# Patient Record
Sex: Male | Born: 1955 | Race: White | Hispanic: No | Marital: Married | Smoking: Never smoker
Health system: Southern US, Community
[De-identification: ages and names within clinical notes are randomized; demographics above are authoritative.]

## PROBLEM LIST (undated history)

## (undated) DIAGNOSIS — Z860100 Personal history of colon polyps, unspecified: Secondary | ICD-10-CM

## (undated) DIAGNOSIS — Z809 Family history of malignant neoplasm, unspecified: Secondary | ICD-10-CM

## (undated) DIAGNOSIS — E119 Type 2 diabetes mellitus without complications: Secondary | ICD-10-CM

## (undated) DIAGNOSIS — I10 Essential (primary) hypertension: Secondary | ICD-10-CM

## (undated) HISTORY — DX: Personal history of colon polyps, unspecified: Z86.0100

## (undated) HISTORY — DX: Type 2 diabetes mellitus without complications: E11.9

## (undated) HISTORY — DX: Essential (primary) hypertension: I10

## (undated) HISTORY — DX: Personal history of colonic polyps: Z86.010

## (undated) HISTORY — DX: Family history of malignant neoplasm, unspecified: Z80.9

## (undated) HISTORY — PX: CARDIAC CATHETERIZATION: SHX172

---

## 2009-11-02 ENCOUNTER — Emergency Department: Payer: Self-pay | Admitting: Internal Medicine

## 2010-11-07 IMAGING — CR DG ABDOMEN 1V
1 series · 2 of 2 positions shown · non-contrast
Comparison: none

REASON FOR EXAM: back pain
COMMENTS:

[Series 1: view not recorded · 0.17mm/px · 2 of 2 slices shown]
[im 1/2]
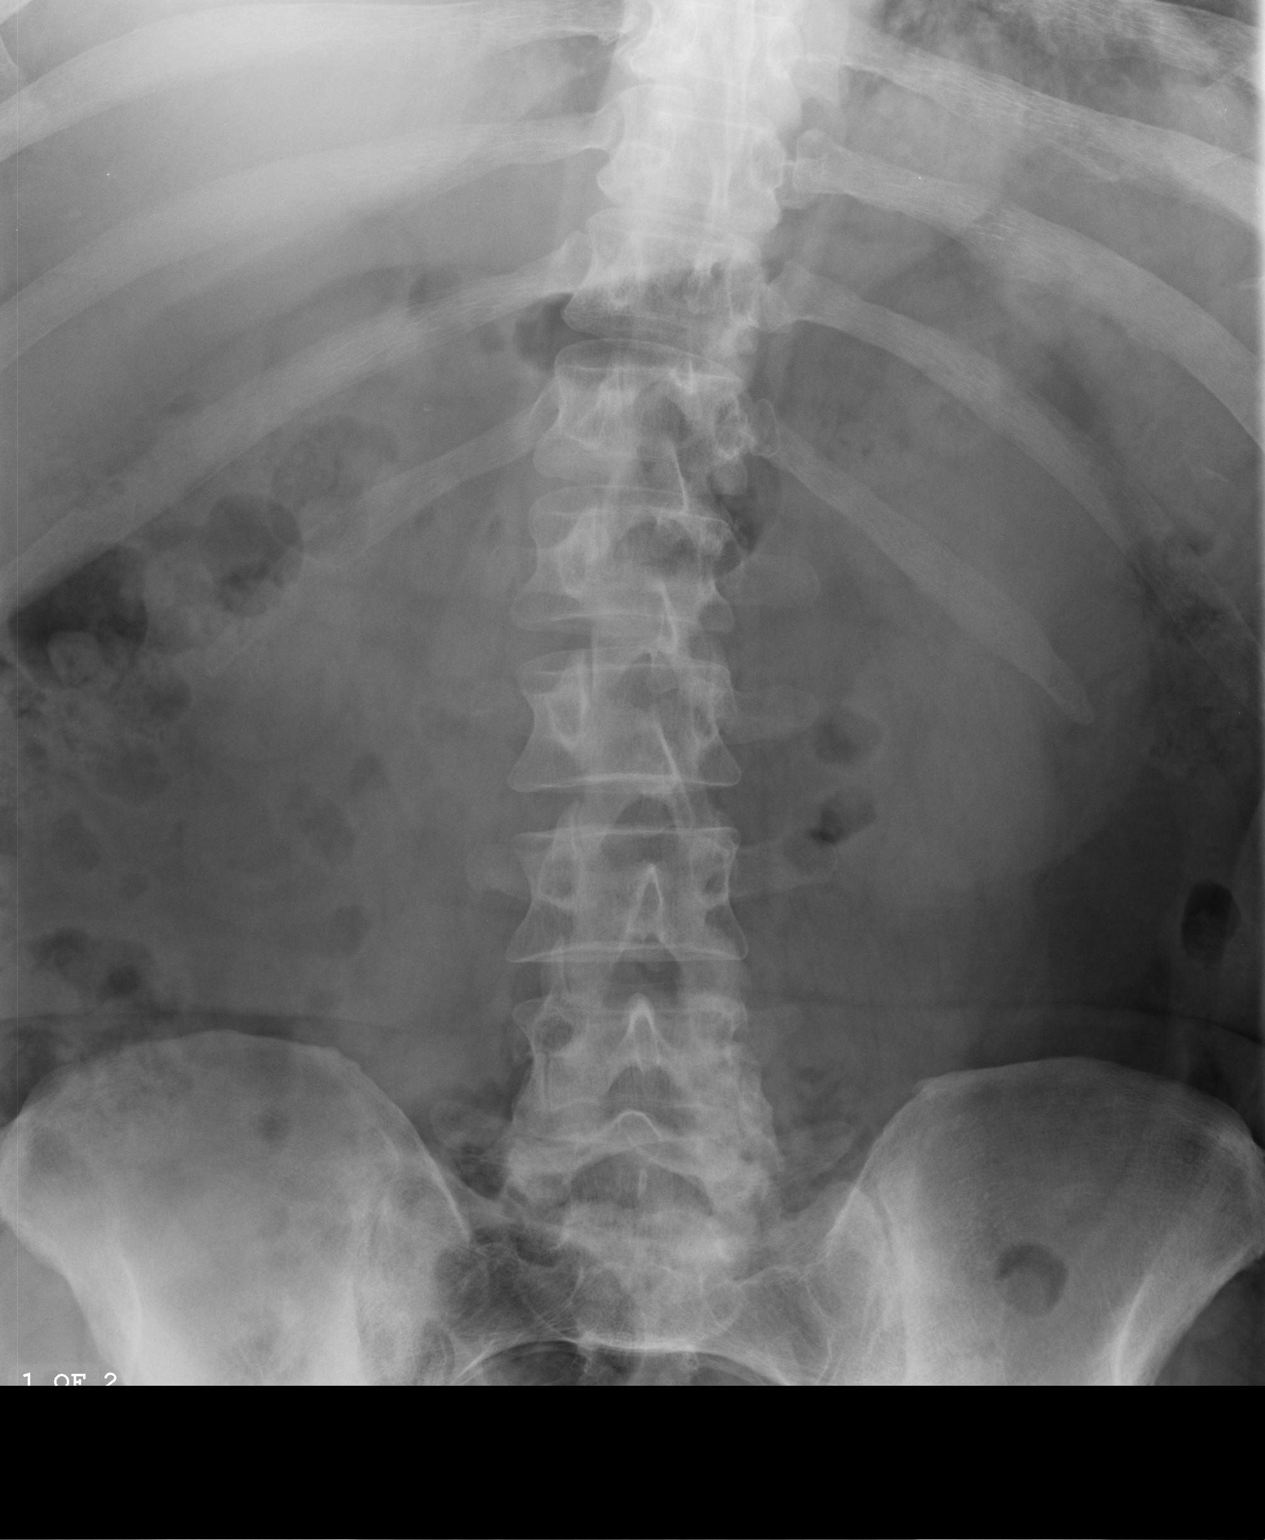
[im 2/2]
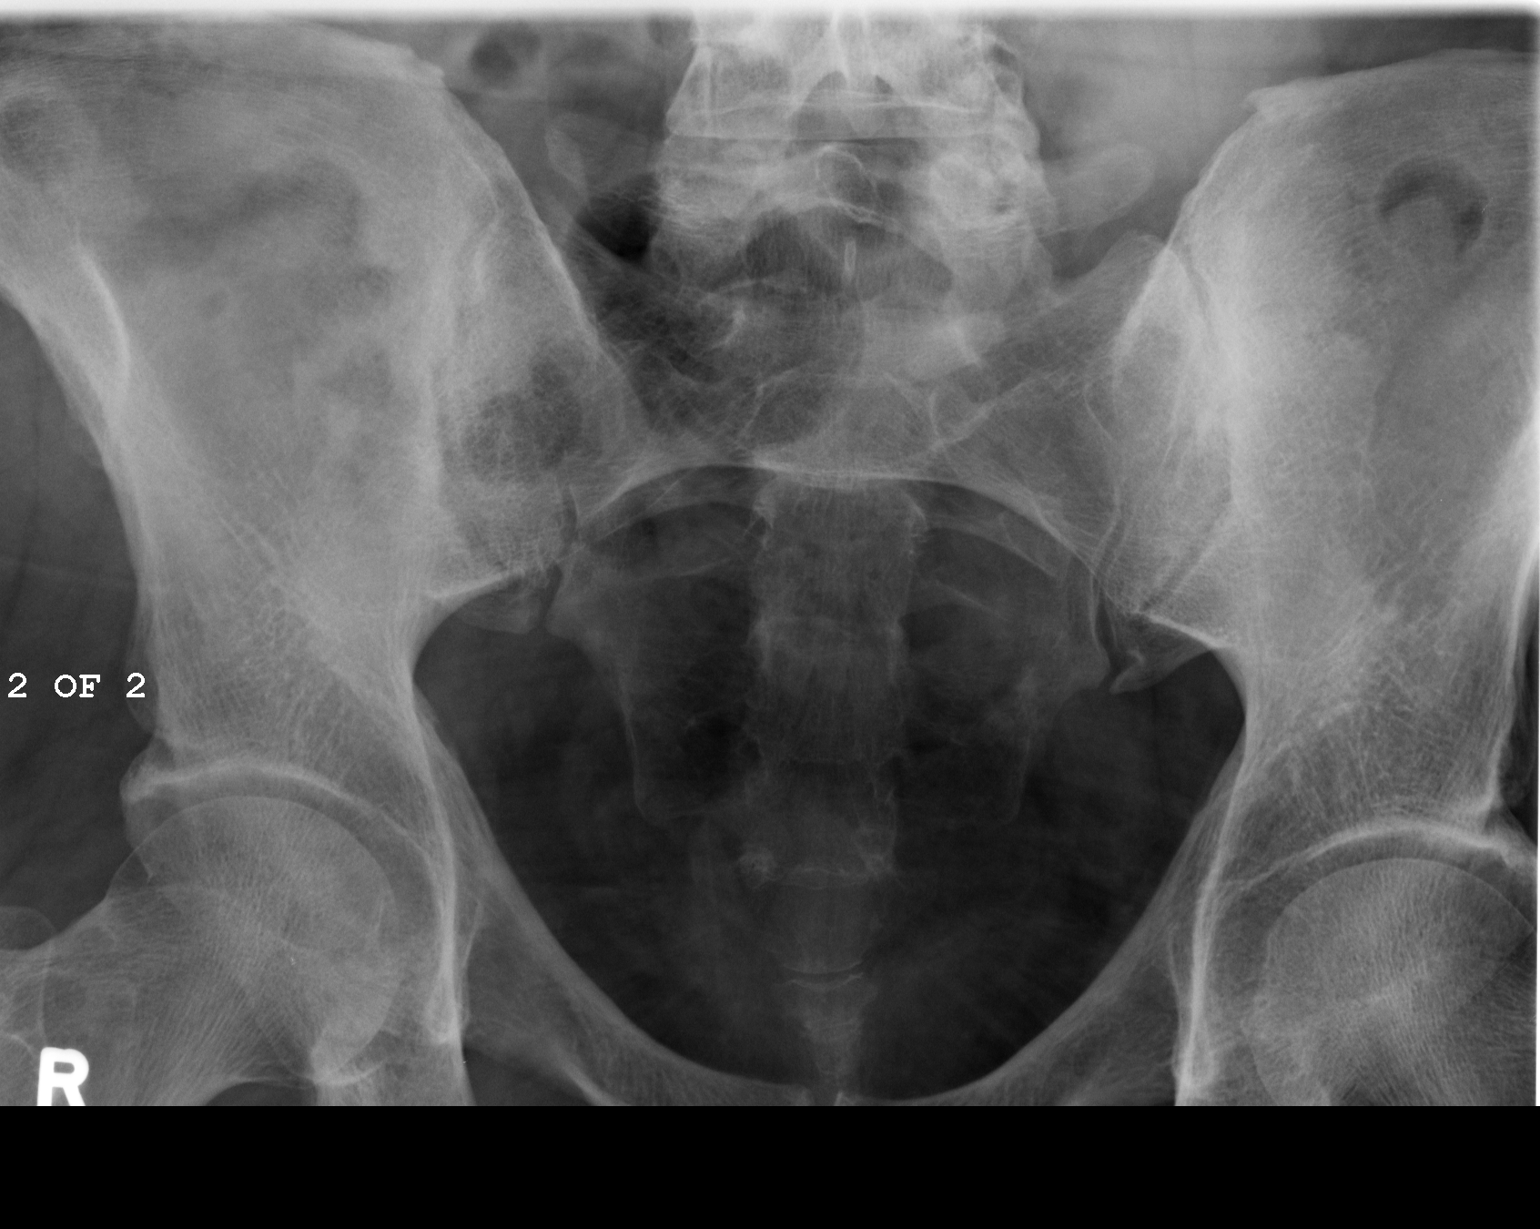

[2 of 2 positions shown; findings below may reference images not displayed]

PROCEDURE:     DXR - DXR KIDNEY URETER BLADDER  - November 02, 2009  [DATE]

RESULT:     The lumbar vertebral bodies are preserved in height. The bony
pelvis appears intact where visualized. I do not see abnormal calcifications
projecting over either kidney. The bowel gas pattern is within the limits of
normal.
IMPRESSION: I do not see objective evidence of calcified urinary tract
stones. Followup imaging is available upon request.

## 2014-07-05 LAB — HEPATIC FUNCTION PANEL
ALT: 55 U/L — AB (ref 10–40)
AST: 41 U/L — AB (ref 14–40)
Alkaline Phosphatase: 97 U/L (ref 25–125)

## 2014-07-05 LAB — LIPID PANEL
Cholesterol: 130 mg/dL (ref 0–200)
HDL: 24 mg/dL — AB (ref 35–70)
LDL Cholesterol: 71 mg/dL
LDl/HDL Ratio: 3
Triglycerides: 175 mg/dL — AB (ref 40–160)

## 2014-07-05 LAB — CBC AND DIFFERENTIAL
HCT: 42 % (ref 41–53)
Hemoglobin: 14.4 g/dL (ref 13.5–17.5)
Neutrophils Absolute: 41 /uL
Platelets: 252 10*3/uL (ref 150–399)
WBC: 6.1 10^3/mL

## 2014-07-05 LAB — TSH: TSH: 2.94 u[IU]/mL (ref <=5.90)

## 2014-07-05 LAB — BASIC METABOLIC PANEL
BUN: 11 mg/dL (ref 4–21)
Creatinine: 0.9 mg/dL (ref <=1.3)
Glucose: 268 mg/dL
Potassium: 5.1 mmol/L (ref 3.4–5.3)
Sodium: 134 mmol/L — AB (ref 137–147)

## 2014-07-05 LAB — HEMOGLOBIN A1C: Hgb A1c MFr Bld: 9.7 % — AB (ref 4.0–6.0)

## 2015-02-12 DIAGNOSIS — A6000 Herpesviral infection of urogenital system, unspecified: Secondary | ICD-10-CM | POA: Insufficient documentation

## 2015-02-12 DIAGNOSIS — I10 Essential (primary) hypertension: Secondary | ICD-10-CM | POA: Insufficient documentation

## 2015-02-12 DIAGNOSIS — E669 Obesity, unspecified: Secondary | ICD-10-CM

## 2015-02-12 DIAGNOSIS — E663 Overweight: Secondary | ICD-10-CM | POA: Insufficient documentation

## 2015-02-12 DIAGNOSIS — E6609 Other obesity due to excess calories: Secondary | ICD-10-CM | POA: Insufficient documentation

## 2015-02-12 DIAGNOSIS — E1169 Type 2 diabetes mellitus with other specified complication: Secondary | ICD-10-CM | POA: Insufficient documentation

## 2015-02-21 ENCOUNTER — Encounter: Payer: Self-pay | Admitting: Family Medicine

## 2015-02-21 ENCOUNTER — Ambulatory Visit (INDEPENDENT_AMBULATORY_CARE_PROVIDER_SITE_OTHER): Payer: BLUE CROSS/BLUE SHIELD | Admitting: Family Medicine

## 2015-02-21 VITALS — BP 142/86 | HR 64 | Temp 97.6°F | Resp 16 | Wt 238.0 lb

## 2015-02-21 DIAGNOSIS — E119 Type 2 diabetes mellitus without complications: Secondary | ICD-10-CM

## 2015-02-21 DIAGNOSIS — I10 Essential (primary) hypertension: Secondary | ICD-10-CM | POA: Diagnosis not present

## 2015-02-21 NOTE — Progress Notes (Signed)
Patient ID: Reginald Murphy, male   DOB: 1955-09-19, 59 y.o.   MRN: [id number]   Reginald Murphy  MRN: [id number] DOB: 02-21-1956  Subjective:  HPI  1. Type 2 diabetes mellitus without complication  Patient presents for follow up of his diabetes.  His last A1C was on 07/05/14 and was 9.7.  He is checking his glucose daily and reports that it is ranging from 109-162.  He reports that he has higher readings in the morning than he does in the evening.  It is of note that the patient works 2nd shift and often eats something late in the evening.  Patient states that he feels well and has no complaints.  2. Essential hypertension   Patient also presents for follow up of his hypertension.  He was last seen on 10/24/14 and at that time his BP reading was 138/82.  He reports he does not have a BP machine at home and does not check his pressure outside of our office.  He states his BP is elevated today due to increased stress as he is going through a divorce currently.  Patient Active Problem List   Diagnosis Date Noted  . Diabetes mellitus type 2 in obese 02/12/2015  . Genital herpes 02/12/2015  . Essential (primary) hypertension 02/12/2015  . Excess weight 02/12/2015    Past Medical History  Diagnosis Date  . Diabetes mellitus without complication   . Hypertension     History   Social History  . Marital Status: Married    Spouse Name: N/A  . Number of Children: N/A  . Years of Education: N/A   Occupational History  . Not on file.   Social History Main Topics  . Smoking status: Never Smoker   . Smokeless tobacco: Not on file  . Alcohol Use: 0.0 oz/week    0 Standard drinks or equivalent per week     Comment: 1/monthly  . Drug Use: No  . Sexual Activity: Not on file   Other Topics Concern  . Not on file   Social History Narrative    Outpatient Prescriptions Prior to Visit  Medication Sig Dispense Refill  . glucose blood test strip 1 each by Other route as needed for other.  Use as instructed    . lisinopril (PRINIVIL,ZESTRIL) 20 MG tablet Take by mouth.    . metFORMIN (GLUCOPHAGE) 1000 MG tablet Take by mouth.     No facility-administered medications prior to visit.    No Known Allergies  Review of Systems  Constitutional: Negative.   HENT: Negative.   Eyes: Negative.   Respiratory: Negative.   Cardiovascular: Negative.   Gastrointestinal: Negative.   Genitourinary: Negative.   Musculoskeletal: Negative.   Neurological: Negative.   Endo/Heme/Allergies: Negative.   Psychiatric/Behavioral: Negative.    Objective:  BP 142/86 mmHg  Pulse 64  Temp(Src) 97.6 F (36.4 C) (Oral)  Resp 16  Wt 238 lb (107.956 kg)  Physical Exam  Constitutional: He is oriented to person, place, and time and well-developed, well-nourished, and in no distress.  HENT:  Head: Normocephalic and atraumatic.  Right Ear: External ear normal.  Left Ear: External ear normal.  Nose: Nose normal.  Mouth/Throat: Oropharynx is clear and moist.  Eyes: Conjunctivae and EOM are normal. Pupils are equal, round, and reactive to light.  Neck: Normal range of motion. Neck supple.  Cardiovascular: Normal rate, regular rhythm, normal heart sounds and intact distal pulses.   Pulmonary/Chest: Effort normal and breath sounds normal.  Abdominal: Soft.  Bowel sounds are normal.  Neurological: He is alert and oriented to person, place, and time. Gait normal.  Skin: Skin is warm and dry.  Psychiatric: Mood, memory, affect and judgment normal.   Monofilament diabetic foot exam is normal  Assessment and Plan :  Type 2 diabetes mellitus without complication  Essential hypertension   Obesity Patient says he has very erratic work hours and therefore erratic eating and exercise habits. I stressed to him that the importance of controlling his disease state is the control of his habits regarding his weight. RTC 3-4 months.  Miguel Aschoff MD [facility] 02/21/2015 11:06 AM

## 2015-02-26 NOTE — Progress Notes (Signed)
No answer, voice mail not set up.  ED

## 2015-02-27 ENCOUNTER — Telehealth: Payer: Self-pay

## 2015-02-27 LAB — LIPID PANEL WITH LDL/HDL RATIO
Cholesterol, Total: 152 mg/dL (ref 100–199)
HDL: 27 mg/dL — ABNORMAL LOW (ref >39)
LDL Calculated: 77 mg/dL (ref 0–99)
LDl/HDL Ratio: 2.9 ratio units (ref 0.0–3.6)
Triglycerides: 240 mg/dL — ABNORMAL HIGH (ref 0–149)
VLDL Cholesterol Cal: 48 mg/dL — ABNORMAL HIGH (ref 5–40)

## 2015-02-27 LAB — CMP14+EGFR
ALT: 19 IU/L (ref 0–44)
AST: 16 IU/L (ref 0–40)
Albumin/Globulin Ratio: 1.9 (ref 1.1–2.5)
Albumin: 4.5 g/dL (ref 3.5–5.5)
Alkaline Phosphatase: 68 IU/L (ref 39–117)
BUN/Creatinine Ratio: 16 (ref 9–20)
BUN: 15 mg/dL (ref 6–24)
Bilirubin Total: 0.5 mg/dL (ref 0.0–1.2)
CO2: 21 mmol/L (ref 18–29)
Calcium: 9.5 mg/dL (ref 8.7–10.2)
Chloride: 103 mmol/L (ref 97–108)
Creatinine, Ser: 0.91 mg/dL (ref 0.76–1.27)
GFR calc Af Amer: 109 mL/min/{1.73_m2} (ref >59)
GFR calc non Af Amer: 94 mL/min/{1.73_m2} (ref >59)
Globulin, Total: 2.4 g/dL (ref 1.5–4.5)
Glucose: 112 mg/dL — ABNORMAL HIGH (ref 65–99)
Potassium: 4.5 mmol/L (ref 3.5–5.2)
Sodium: 140 mmol/L (ref 134–144)
Total Protein: 6.9 g/dL (ref 6.0–8.5)

## 2015-02-27 LAB — TSH: TSH: 2.8 u[IU]/mL (ref 0.450–4.500)

## 2015-02-27 LAB — HEMOGLOBIN A1C
Est. average glucose Bld gHb Est-mCnc: 146 mg/dL
Hgb A1c MFr Bld: 6.7 % — ABNORMAL HIGH (ref 4.8–5.6)

## 2015-02-27 NOTE — Telephone Encounter (Signed)
Patient has not yet been notified  ED

## 2015-02-27 NOTE — Telephone Encounter (Signed)
Check if patient has been advised

## 2015-03-01 LAB — CBC WITH DIFFERENTIAL/PLATELET

## 2015-03-01 LAB — SPECIMEN STATUS REPORT

## 2015-07-26 ENCOUNTER — Ambulatory Visit: Payer: BLUE CROSS/BLUE SHIELD | Admitting: Family Medicine

## 2015-07-31 ENCOUNTER — Ambulatory Visit: Payer: BLUE CROSS/BLUE SHIELD | Admitting: Family Medicine

## 2015-08-11 ENCOUNTER — Other Ambulatory Visit: Payer: Self-pay | Admitting: Family Medicine

## 2015-08-15 ENCOUNTER — Encounter: Payer: Self-pay | Admitting: Family Medicine

## 2015-08-15 ENCOUNTER — Ambulatory Visit (INDEPENDENT_AMBULATORY_CARE_PROVIDER_SITE_OTHER): Payer: BLUE CROSS/BLUE SHIELD | Admitting: Family Medicine

## 2015-08-15 ENCOUNTER — Encounter: Payer: Self-pay | Admitting: *Deleted

## 2015-08-15 VITALS — BP 144/80 | HR 82 | Temp 98.7°F | Resp 16 | Wt 242.0 lb

## 2015-08-15 DIAGNOSIS — I10 Essential (primary) hypertension: Secondary | ICD-10-CM | POA: Diagnosis not present

## 2015-08-15 DIAGNOSIS — L918 Other hypertrophic disorders of the skin: Secondary | ICD-10-CM | POA: Diagnosis not present

## 2015-08-15 DIAGNOSIS — E669 Obesity, unspecified: Secondary | ICD-10-CM

## 2015-08-15 DIAGNOSIS — E119 Type 2 diabetes mellitus without complications: Secondary | ICD-10-CM | POA: Diagnosis not present

## 2015-08-15 DIAGNOSIS — E663 Overweight: Secondary | ICD-10-CM | POA: Diagnosis not present

## 2015-08-15 DIAGNOSIS — E1169 Type 2 diabetes mellitus with other specified complication: Secondary | ICD-10-CM

## 2015-08-15 LAB — POCT UA - MICROALBUMIN: Microalbumin Ur, POC: 50 mg/L

## 2015-08-15 LAB — POCT GLYCOSYLATED HEMOGLOBIN (HGB A1C): Hemoglobin A1C: 6.3

## 2015-08-15 MED ORDER — LISINOPRIL 40 MG PO TABS: 40.0000 mg | ORAL_TABLET | Freq: Every day | ORAL | Status: DC

## 2015-08-15 NOTE — Progress Notes (Signed)
Patient ID: Reginald Murphy, male   DOB: November 17, 1955, 59 y.o.   MRN: [id number]    Subjective:  HPI  Diabetes follow up: Patient is checking his sugar-readings vary from 130-150 right now. He is taking Metformin daily. Lab Results  Component Value Date   HGBA1C 6.7* 02/21/2015   Lab Results  Component Value Date   CHOL 152 02/21/2015   HDL 27* 02/21/2015   LDLCALC 77 02/21/2015   TRIG 240* 02/21/2015     Hypertension follow up: Patient does not check his B/P. He is not having cardiac symptoms. BP Readings from Last 3 Encounters:  08/15/15 144/80  02/21/15 142/86  10/24/14 138/82   overall patient feels well and has no complaints. He is trying to start some exercise.  Prior to Admission medications   Medication Sig Start Date End Date Taking? Authorizing Provider  glucose blood test strip 1 each by Other route as needed for other. Use as instructed   Yes Historical Provider, MD  lisinopril (PRINIVIL,ZESTRIL) 20 MG tablet TAKE 1 TABLET BY MOUTH EVERY DAY 08/13/15  Yes Jerrol Banana., MD  metFORMIN (GLUCOPHAGE) 1000 MG tablet TAKE 1 TABLET BY MOUTH IN THE EVENING 08/13/15  Yes Demecia Northway Maceo Pro., MD  [facility] DELICA LANCETS 99991111 MISC 2 (two) times daily. for testing 05/18/15  Yes Historical Provider, MD    Patient Active Problem List   Diagnosis Date Noted  . Diabetes mellitus type 2 in obese ([place]) 02/12/2015  . Genital herpes 02/12/2015  . Essential (primary) hypertension 02/12/2015  . Excess weight 02/12/2015    Past Medical History  Diagnosis Date  . Diabetes mellitus without complication ([place])   . Hypertension     Social History   Social History  . Marital Status: Married    Spouse Name: N/A  . Number of Children: N/A  . Years of Education: N/A   Occupational History  . Not on file.   Social History Main Topics  . Smoking status: Never Smoker   . Smokeless tobacco: Never Used  . Alcohol Use: 0.0 oz/week    0 Standard drinks or equivalent per week       Comment: 1/monthly  . Drug Use: No  . Sexual Activity: Not on file   Other Topics Concern  . Not on file   Social History Narrative    No Known Allergies  Review of Systems  Constitutional: Negative.   HENT: Negative.   Eyes: Negative.   Respiratory: Negative.   Cardiovascular: Negative.   Gastrointestinal: Negative.   Musculoskeletal: Negative.   Skin: Negative.   Neurological: Negative.   Psychiatric/Behavioral: Negative.     Immunization History  Administered Date(s) Administered  . Tdap 07/04/2014   Objective:  BP 144/80 mmHg  Pulse 82  Temp(Src) 98.7 F (37.1 C)  Resp 16  Wt 242 lb (109.77 kg)  Physical Exam  Constitutional: He is oriented to person, place, and time and well-developed, well-nourished, and in no distress.  HENT:  Head: Normocephalic and atraumatic.  Right Ear: External ear normal.  Left Ear: External ear normal.  Nose: Nose normal.  Eyes: Conjunctivae are normal. Pupils are equal, round, and reactive to light.  Neck: Normal range of motion. Neck supple.  Cardiovascular: Normal rate, regular rhythm, normal heart sounds and intact distal pulses.   No murmur heard. Pulmonary/Chest: Effort normal and breath sounds normal. No respiratory distress. He has no wheezes.  Abdominal: Soft.  Musculoskeletal: Normal range of motion. He exhibits no edema.  Neurological:  He is alert and oriented to person, place, and time.  Skin: Skin is warm and dry.  Has large skin tag present in the left upper leg just below the groin.  Psychiatric: Mood, memory, affect and judgment normal.    Lab Results  Component Value Date   WBC CANCELED 02/21/2015   HGB 14.4 07/05/2014   HCT CANCELED 02/21/2015   PLT 252 07/05/2014   GLUCOSE 112* 02/21/2015   CHOL 152 02/21/2015   TRIG 240* 02/21/2015   HDL 27* 02/21/2015   LDLCALC 77 02/21/2015   TSH 2.800 02/21/2015   HGBA1C 6.7* 02/21/2015    CMP     Component Value Date/Time   NA 140 02/21/2015 1150    K 4.5 02/21/2015 1150   CL 103 02/21/2015 1150   CO2 21 02/21/2015 1150   GLUCOSE 112* 02/21/2015 1150   BUN 15 02/21/2015 1150   CREATININE 0.91 02/21/2015 1150   CREATININE 0.9 07/05/2014   CALCIUM 9.5 02/21/2015 1150   PROT 6.9 02/21/2015 1150   ALBUMIN 4.5 02/21/2015 1150   AST 16 02/21/2015 1150   ALT 19 02/21/2015 1150   ALKPHOS 68 02/21/2015 1150   BILITOT 0.5 02/21/2015 1150   GFRNONAA 94 02/21/2015 1150   GFRAA 109 02/21/2015 1150    Assessment and Plan :  1. Essential (primary) hypertension Borderline B/P and per urine microalbumin some protein been spilled in his kidneys will increase Lisinopril to 40 mg. - lisinopril (PRINIVIL,ZESTRIL) 40 MG tablet; Take 1 tablet (40 mg total) by mouth daily.  Dispense: 90 tablet; Refill: 3  2. Diabetes mellitus type 2 in obese (HCC) A1C 6.3. Better. Continue current medication. - POCT UA - Microalbumin - POCT HgB A1C--6.3 today. - lisinopril (PRINIVIL,ZESTRIL) 40 MG tablet; Take 1 tablet (40 mg total) by mouth daily.  Dispense: 90 tablet; Refill: 3  3. Excess weight/Obesity Continue working on habits.  4. Very Large skin tag-Left leg Dee to size will refer to Surgeon for this. This is very irritated due to location and size.  I have done the exam and reviewed the above chart and it is accurate to the best of my knowledge.  Patient was seen and examined by Dr. Eulas Post and note was scribed by Theressa Millard, RMA.    Reginald Aschoff MD [facility] 08/15/2015 10:13 AM

## 2015-09-11 ENCOUNTER — Encounter: Payer: Self-pay | Admitting: General Surgery

## 2015-09-11 ENCOUNTER — Ambulatory Visit (INDEPENDENT_AMBULATORY_CARE_PROVIDER_SITE_OTHER): Payer: BLUE CROSS/BLUE SHIELD | Admitting: General Surgery

## 2015-09-11 VITALS — BP 130/74 | HR 76 | Resp 12 | Ht 70.0 in | Wt 238.0 lb

## 2015-09-11 DIAGNOSIS — R2242 Localized swelling, mass and lump, left lower limb: Secondary | ICD-10-CM

## 2015-09-11 DIAGNOSIS — R2240 Localized swelling, mass and lump, unspecified lower limb: Secondary | ICD-10-CM | POA: Insufficient documentation

## 2015-09-11 NOTE — Patient Instructions (Signed)
Patient to return for left thigh excision .

## 2015-09-11 NOTE — Progress Notes (Signed)
Patient ID: Reginald Murphy, male   DOB: [birth year], 60 y.o.   MRN: [id number]  Chief Complaint  Patient presents with  . Other    skin tag    HPI Reginald Murphy is a 60 y.o. male. here today for a evaluation of  a skin tag left thigh. He states the area has been there for there for about three years and has got bigger over the years.   I personally reviewed the patient's history.                                                                                                                        HPI  Past Medical History  Diagnosis Date  . Diabetes mellitus without complication ([place])   . Hypertension     No past surgical history on file.  Family History  Problem Relation Age of Onset  . Diabetes Mother   . Seizures Father   . Heart attack Maternal Grandmother   . Cancer Maternal Grandfather     Social History Social History  Substance Use Topics  . Smoking status: Never Smoker   . Smokeless tobacco: Never Used  . Alcohol Use: 0.0 oz/week    0 Standard drinks or equivalent per week     Comment: 1/monthly    No Known Allergies  Current Outpatient Prescriptions  Medication Sig Dispense Refill  . glucose blood test strip 1 each by Other route as needed for other. Use as instructed    . lisinopril (PRINIVIL,ZESTRIL) 40 MG tablet Take 1 tablet (40 mg total) by mouth daily. 90 tablet 3  . metFORMIN (GLUCOPHAGE) 1000 MG tablet TAKE 1 TABLET BY MOUTH IN THE EVENING 90 tablet 3  . ONETOUCH DELICA LANCETS 99991111 MISC 2 (two) times daily. for testing  12   No current facility-administered medications for this visit.    Review of Systems Review of Systems  Constitutional: Negative.   Respiratory: Negative.   Cardiovascular: Negative.     Blood pressure 130/74, pulse 76, resp. rate 12, height 5\' 10"  (1.778 m), weight 238 lb (107.956 kg).  Physical Exam Physical Exam  Constitutional: He is oriented to person, place, and time. He appears well-developed and well-nourished.   Eyes: Conjunctivae are normal. No scleral icterus.  Neck: Neck supple.  Cardiovascular: Normal rate, regular rhythm and normal heart sounds.   Pulmonary/Chest: Effort normal and breath sounds normal.  Genitourinary:     Lymphadenopathy:    He has no cervical adenopathy.  Neurological: He is alert and oriented to person, place, and time.  Skin: Skin is warm and dry.       Assessment    Left proximal inner thigh mass    Plan    Due to the enlargement and local discomfort with long periods of walking and long car rides, excision is reasonable. This can be completed under local anesthesia     Schedule left thigh skin tag excision.  This information has been scribed by Gaspar Cola  CMA.  PCP:  Dewaine Conger 09/11/2015, 9:49 PM

## 2015-10-01 ENCOUNTER — Ambulatory Visit (INDEPENDENT_AMBULATORY_CARE_PROVIDER_SITE_OTHER): Payer: BLUE CROSS/BLUE SHIELD | Admitting: General Surgery

## 2015-10-01 ENCOUNTER — Encounter: Payer: Self-pay | Admitting: General Surgery

## 2015-10-01 VITALS — BP 142/68 | HR 86 | Resp 14 | Ht 70.0 in | Wt 240.0 lb

## 2015-10-01 DIAGNOSIS — R2242 Localized swelling, mass and lump, left lower limb: Secondary | ICD-10-CM

## 2015-10-01 MED ORDER — HYDROCODONE-ACETAMINOPHEN 5-325 MG PO TABS: 1.0000 | ORAL_TABLET | ORAL | Status: DC | PRN

## 2015-10-01 NOTE — Patient Instructions (Signed)
Apply ice to the area. Call with any questions and concerns. Can shower take waterproof bandage on Wednesday, leave strips on until they fall off.

## 2015-10-01 NOTE — Progress Notes (Signed)
Patient ID: Reginald Murphy, male   DOB: 1955-09-29, 60 y.o.   MRN: [id number]  Chief Complaint  Patient presents with  . Procedure    Excision Left Thight    HPI Reginald Murphy is a 60 y.o. male here today for an excision of left thigh lesion.   I personally reviewed the patient's history. HPI  Past Medical History  Diagnosis Date  . Diabetes mellitus without complication ([place])   . Hypertension     No past surgical history on file.  Family History  Problem Relation Age of Onset  . Diabetes Mother   . Seizures Father   . Heart attack Maternal Grandmother   . Cancer Maternal Grandfather     Social History Social History  Substance Use Topics  . Smoking status: Never Smoker   . Smokeless tobacco: Never Used  . Alcohol Use: 0.0 oz/week    0 Standard drinks or equivalent per week     Comment: 1/monthly    No Known Allergies  Current Outpatient Prescriptions  Medication Sig Dispense Refill  . glucose blood test strip 1 each by Other route as needed for other. Use as instructed    . lisinopril (PRINIVIL,ZESTRIL) 40 MG tablet Take 1 tablet (40 mg total) by mouth daily. 90 tablet 3  . metFORMIN (GLUCOPHAGE) 1000 MG tablet TAKE 1 TABLET BY MOUTH IN THE EVENING 90 tablet 3  . ONETOUCH DELICA LANCETS 99991111 MISC 2 (two) times daily. for testing  12  . HYDROcodone-acetaminophen (NORCO) 5-325 MG tablet Take 1-2 tablets by mouth every 4 (four) hours as needed for moderate pain. 30 tablet 0   No current facility-administered medications for this visit.    Review of Systems Review of Systems  Constitutional: Negative.   Respiratory: Negative.   Cardiovascular: Negative.     Blood pressure 142/68, pulse 86, resp. rate 14, height 5\' 10"  (1.778 m), weight 240 lb (108.863 kg).  Physical Exam Physical Exam  Constitutional: He is oriented to person, place, and time. He appears well-developed and well-nourished.  Musculoskeletal:       Legs: Neurological: He is alert and  oriented to person, place, and time.  Skin: Skin is warm and dry.      Assessment    Pedunculated lipoma left thigh.    Plan     The patient was amenable to excision. Alcohol was used to clean the skin followed by 10 mL of 0.5% Xylocaine with 0.25% Marcaine with 1-200,000 of epinephrine. The area was excised through elliptical incision. A lipoma-like mass was found to extend down the stalk into the soft tissue of the thigh. This was sharply excised. Good hemostasis was noted. The wound was closed with a running 3-0 Vicryl suture to the adipose layer and a running 3-0 Vicryl septic suture for the skin. Benzoin, Steri-Strips, Telfa and Tegaderm dressing applied.  The procedure was well tolerated. The patient will report if he appreciates any difficulty with wound healing. Follow up otherwise will be on an as-needed basis.   A prescription for Norco was provided for postoperative pain relief if needed.  Apply ice to the area.       This information has been scribed by Verlene Mayer, CMA  PCP: Dr. Juliette Mangle, [place] [facility] 10/02/2015, 6:01 PM

## 2015-10-03 ENCOUNTER — Telehealth: Payer: Self-pay

## 2015-10-03 NOTE — Telephone Encounter (Signed)
-----   Message from Robert Bellow, MD sent at 10/03/2015  2:52 PM EST ----- Please notify the patient that the pathology was fine: Lipoma as expected.  ----- Message -----    From: Lab in [facility] Interface    Sent: 10/03/2015   9:19 AM      To: Robert Bellow, MD

## 2015-10-03 NOTE — Telephone Encounter (Signed)
Notified patient as instructed, patient pleased. Discussed follow-up appointment if needed, patient agrees.   

## 2015-11-15 ENCOUNTER — Ambulatory Visit: Payer: BLUE CROSS/BLUE SHIELD | Admitting: Family Medicine

## 2015-11-27 ENCOUNTER — Ambulatory Visit: Payer: BLUE CROSS/BLUE SHIELD | Admitting: Family Medicine

## 2016-02-13 ENCOUNTER — Ambulatory Visit (INDEPENDENT_AMBULATORY_CARE_PROVIDER_SITE_OTHER): Payer: BLUE CROSS/BLUE SHIELD | Admitting: Family Medicine

## 2016-02-13 VITALS — BP 118/72 | HR 80 | Temp 98.5°F | Resp 14 | Wt 244.0 lb

## 2016-02-13 DIAGNOSIS — I10 Essential (primary) hypertension: Secondary | ICD-10-CM | POA: Diagnosis not present

## 2016-02-13 DIAGNOSIS — E663 Overweight: Secondary | ICD-10-CM

## 2016-02-13 DIAGNOSIS — E669 Obesity, unspecified: Secondary | ICD-10-CM

## 2016-02-13 DIAGNOSIS — E119 Type 2 diabetes mellitus without complications: Secondary | ICD-10-CM

## 2016-02-13 DIAGNOSIS — E1169 Type 2 diabetes mellitus with other specified complication: Secondary | ICD-10-CM

## 2016-02-13 LAB — POCT GLYCOSYLATED HEMOGLOBIN (HGB A1C): Hemoglobin A1C: 6.8

## 2016-02-13 NOTE — Progress Notes (Signed)
Patient ID: Reginald Murphy, male   DOB: 05-Feb-1956, 60 y.o.   MRN: [id number]    Subjective:  HPI  Patient is here for 6 months follow up.  Hypertension: patient does not check his b/p. On the last visit in December Lisinopril was increased to 40 mg.  BP Readings from Last 3 Encounters:  02/13/16 118/72  10/01/15 142/68  09/11/15 130/74   Diabetes: patient does not really check his sugar. His tingling sensation in the feet is still there and the same. Lab Results  Component Value Date   HGBA1C 6.3 08/15/2015   Last labs were in June 2016  Prior to Admission medications   Medication Sig Start Date End Date Taking? Authorizing Provider  glucose blood test strip 1 each by Other route as needed for other. Use as instructed   Yes Historical Provider, MD  lisinopril (PRINIVIL,ZESTRIL) 40 MG tablet Take 1 tablet (40 mg total) by mouth daily. 08/15/15  Yes Ajooni Karam Maceo Pro., MD  metFORMIN (GLUCOPHAGE) 1000 MG tablet TAKE 1 TABLET BY MOUTH IN THE EVENING 08/13/15  Yes Ortha Metts Maceo Pro., MD  [facility] DELICA LANCETS 99991111 MISC 2 (two) times daily. for testing 05/18/15  Yes Historical Provider, MD    Patient Active Problem List   Diagnosis Date Noted  . Mass of thigh 09/11/2015  . Diabetes mellitus type 2 in obese ([place]) 02/12/2015  . Genital herpes 02/12/2015  . Essential (primary) hypertension 02/12/2015  . Excess weight 02/12/2015    Past Medical History  Diagnosis Date  . Diabetes mellitus without complication ([place])   . Hypertension     Social History   Social History  . Marital Status: Legally Separated    Spouse Name: N/A  . Number of Children: N/A  . Years of Education: N/A   Occupational History  . Not on file.   Social History Main Topics  . Smoking status: Never Smoker   . Smokeless tobacco: Never Used  . Alcohol Use: 0.0 oz/week    0 Standard drinks or equivalent per week     Comment: 1/monthly  . Drug Use: No  . Sexual Activity: Not on file   Other  Topics Concern  . Not on file   Social History Narrative    No Known Allergies  Review of Systems  Constitutional: Negative.   HENT: Negative.   Eyes: Negative.   Respiratory: Negative.   Cardiovascular: Negative.   Musculoskeletal: Negative.   Skin: Negative.   Neurological: Positive for tingling.  Endo/Heme/Allergies: Negative.   Psychiatric/Behavioral: Negative.     Immunization History  Administered Date(s) Administered  . Tdap 07/04/2014   Objective:  BP 118/72 mmHg  Pulse 80  Temp(Src) 98.5 F (36.9 C)  Resp 14  Wt 244 lb (110.678 kg)  Physical Exam  Constitutional: He is oriented to person, place, and time and well-developed, well-nourished, and in no distress.  HENT:  Head: Normocephalic and atraumatic.  Right Ear: External ear normal.  Left Ear: External ear normal.  Nose: Nose normal.  Eyes: Conjunctivae are normal. Pupils are equal, round, and reactive to light.  Neck: Normal range of motion. Neck supple.  Cardiovascular: Normal rate, regular rhythm, normal heart sounds and intact distal pulses.   No murmur heard. Pulmonary/Chest: Effort normal and breath sounds normal. No respiratory distress.  Abdominal: Soft.  Musculoskeletal: Normal range of motion. He exhibits no edema or tenderness.  Neurological: He is alert and oriented to person, place, and time.  Skin: Skin is warm and  dry.  Psychiatric: Mood, memory, affect and judgment normal.    Lab Results  Component Value Date   WBC CANCELED 02/21/2015   HGB 14.4 07/05/2014   HCT CANCELED 02/21/2015   PLT CANCELED 02/21/2015   GLUCOSE 112* 02/21/2015   CHOL 152 02/21/2015   TRIG 240* 02/21/2015   HDL 27* 02/21/2015   LDLCALC 77 02/21/2015   TSH 2.800 02/21/2015   HGBA1C 6.3 08/15/2015    CMP     Component Value Date/Time   NA 140 02/21/2015 1150   K 4.5 02/21/2015 1150   CL 103 02/21/2015 1150   CO2 21 02/21/2015 1150   GLUCOSE 112* 02/21/2015 1150   BUN 15 02/21/2015 1150    CREATININE 0.91 02/21/2015 1150   CREATININE 0.9 07/05/2014   CALCIUM 9.5 02/21/2015 1150   PROT 6.9 02/21/2015 1150   ALBUMIN 4.5 02/21/2015 1150   AST 16 02/21/2015 1150   ALT 19 02/21/2015 1150   ALKPHOS 68 02/21/2015 1150   BILITOT 0.5 02/21/2015 1150   GFRNONAA 94 02/21/2015 1150   GFRAA 109 02/21/2015 1150    Assessment and Plan :  1. Essential (primary) hypertension Stable. - CBC with Differential/Platelet - Comprehensive metabolic panel  2. Diabetes mellitus type 2 in obese (HCC) A1C 6.8 today, worse. Need to work on habits, may need to add medication or change current medication on the next visit.  3. Excess weight - Lipid Panel With LDL/HDL Ratio - TSH  Patient was seen and examined by Dr. Eulas Post and note was scribed by Theressa Millard, RMA. I have done the exam and reviewed the above chart and it is accurate to the best of my knowledge.  Miguel Aschoff MD [facility] 02/13/2016 10:20 AM

## 2016-02-14 LAB — TSH: TSH: 2.38 u[IU]/mL (ref 0.450–4.500)

## 2016-02-14 LAB — LIPID PANEL WITH LDL/HDL RATIO
Cholesterol, Total: 184 mg/dL (ref 100–199)
HDL: 30 mg/dL — ABNORMAL LOW (ref >39)
LDL Calculated: 75 mg/dL (ref 0–99)
LDl/HDL Ratio: 2.5 ratio units (ref 0.0–3.6)
Triglycerides: 394 mg/dL — ABNORMAL HIGH (ref 0–149)
VLDL Cholesterol Cal: 79 mg/dL — ABNORMAL HIGH (ref 5–40)

## 2016-02-14 LAB — CBC WITH DIFFERENTIAL/PLATELET
Basophils Absolute: 0 10*3/uL (ref 0.0–0.2)
Basos: 0 %
EOS (ABSOLUTE): 0.2 10*3/uL (ref 0.0–0.4)
Eos: 3 %
Hematocrit: 43 % (ref 37.5–51.0)
Hemoglobin: 15.3 g/dL (ref 12.6–17.7)
Immature Grans (Abs): 0 10*3/uL (ref 0.0–0.1)
Immature Granulocytes: 0 %
Lymphocytes Absolute: 3 10*3/uL (ref 0.7–3.1)
Lymphs: 43 %
MCH: 29.9 pg (ref 26.6–33.0)
MCHC: 35.6 g/dL (ref 31.5–35.7)
MCV: 84 fL (ref 79–97)
Monocytes Absolute: 0.6 10*3/uL (ref 0.1–0.9)
Monocytes: 9 %
Neutrophils Absolute: 3.2 10*3/uL (ref 1.4–7.0)
Neutrophils: 45 %
Platelets: 183 10*3/uL (ref 150–379)
RBC: 5.12 x10E6/uL (ref 4.14–5.80)
RDW: 13.3 % (ref 12.3–15.4)
WBC: 7.1 10*3/uL (ref 3.4–10.8)

## 2016-02-14 LAB — COMPREHENSIVE METABOLIC PANEL
ALT: 25 IU/L (ref 0–44)
AST: 15 IU/L (ref 0–40)
Albumin/Globulin Ratio: 1.6 (ref 1.2–2.2)
Albumin: 4.6 g/dL (ref 3.5–5.5)
Alkaline Phosphatase: 82 IU/L (ref 39–117)
BUN/Creatinine Ratio: 16 (ref 9–20)
BUN: 15 mg/dL (ref 6–24)
Bilirubin Total: 0.7 mg/dL (ref 0.0–1.2)
CO2: 24 mmol/L (ref 18–29)
Calcium: 9.9 mg/dL (ref 8.7–10.2)
Chloride: 98 mmol/L (ref 96–106)
Creatinine, Ser: 0.95 mg/dL (ref 0.76–1.27)
GFR calc Af Amer: 102 mL/min/{1.73_m2} (ref >59)
GFR calc non Af Amer: 88 mL/min/{1.73_m2} (ref >59)
Globulin, Total: 2.8 g/dL (ref 1.5–4.5)
Glucose: 126 mg/dL — ABNORMAL HIGH (ref 65–99)
Potassium: 4.5 mmol/L (ref 3.5–5.2)
Sodium: 139 mmol/L (ref 134–144)
Total Protein: 7.4 g/dL (ref 6.0–8.5)

## 2016-05-14 ENCOUNTER — Ambulatory Visit: Payer: BLUE CROSS/BLUE SHIELD | Admitting: Family Medicine

## 2016-05-21 ENCOUNTER — Ambulatory Visit: Payer: BLUE CROSS/BLUE SHIELD | Admitting: Family Medicine

## 2016-06-02 ENCOUNTER — Ambulatory Visit: Payer: BLUE CROSS/BLUE SHIELD | Admitting: Family Medicine

## 2016-06-09 ENCOUNTER — Ambulatory Visit (INDEPENDENT_AMBULATORY_CARE_PROVIDER_SITE_OTHER): Payer: BLUE CROSS/BLUE SHIELD | Admitting: Family Medicine

## 2016-06-09 ENCOUNTER — Encounter: Payer: Self-pay | Admitting: Family Medicine

## 2016-06-09 VITALS — BP 166/90 | HR 84 | Temp 98.8°F | Resp 16 | Wt 246.0 lb

## 2016-06-09 DIAGNOSIS — Z2821 Immunization not carried out because of patient refusal: Secondary | ICD-10-CM | POA: Diagnosis not present

## 2016-06-09 DIAGNOSIS — E1169 Type 2 diabetes mellitus with other specified complication: Secondary | ICD-10-CM

## 2016-06-09 DIAGNOSIS — I10 Essential (primary) hypertension: Secondary | ICD-10-CM | POA: Diagnosis not present

## 2016-06-09 DIAGNOSIS — E669 Obesity, unspecified: Secondary | ICD-10-CM | POA: Diagnosis not present

## 2016-06-09 DIAGNOSIS — Z23 Encounter for immunization: Secondary | ICD-10-CM

## 2016-06-09 DIAGNOSIS — E663 Overweight: Secondary | ICD-10-CM

## 2016-06-09 LAB — POCT GLYCOSYLATED HEMOGLOBIN (HGB A1C): Hemoglobin A1C: 6.6

## 2016-06-09 NOTE — Progress Notes (Signed)
Reginald Murphy  MRN: [id number] DOB: 1956/01/13  Subjective:  HPI  Patient is here for follow up.  Hypertension: patient does not check his b/p. No cardiac symptoms. BP Readings from Last 3 Encounters:  06/09/16 (!) 166/90  02/13/16 118/72  10/01/15 (!) 142/68    Diabetes: patient does not check his sugar at home. Does not really watch his diet. He travels a lot for work. Lab Results  Component Value Date   HGBA1C 6.8 02/13/2016   Patient Active Problem List   Diagnosis Date Noted  . Mass of thigh 09/11/2015  . Diabetes mellitus type 2 in obese ([place]) 02/12/2015  . Genital herpes 02/12/2015  . Essential (primary) hypertension 02/12/2015  . Excess weight 02/12/2015    Past Medical History:  Diagnosis Date  . Diabetes mellitus without complication ([place])   . Hypertension     Social History   Social History  . Marital status: Legally Separated    Spouse name: N/A  . Number of children: N/A  . Years of education: N/A   Occupational History  . Not on file.   Social History Main Topics  . Smoking status: Never Smoker  . Smokeless tobacco: Never Used  . Alcohol use 0.0 oz/week     Comment: 1/monthly  . Drug use: No  . Sexual activity: Not on file   Other Topics Concern  . Not on file   Social History Narrative  . No narrative on file    Outpatient Encounter Prescriptions as of 06/09/2016  Medication Sig Note  . glucose blood test strip 1 each by Other route as needed for other. Use as instructed   . lisinopril (PRINIVIL,ZESTRIL) 40 MG tablet Take 1 tablet (40 mg total) by mouth daily.   . metFORMIN (GLUCOPHAGE) 1000 MG tablet TAKE 1 TABLET BY MOUTH IN THE EVENING   . ONETOUCH DELICA LANCETS 99991111 MISC 2 (two) times daily. for testing 08/15/2015: Received from: External Pharmacy   No facility-administered encounter medications on file as of 06/09/2016.     No Known Allergies  Review of Systems  Constitutional: Negative.   Eyes: Negative.   Respiratory:  Negative.   Cardiovascular: Negative.   Gastrointestinal: Negative.   Musculoskeletal: Negative.   Skin: Negative.   Neurological: Negative.   Endo/Heme/Allergies: Negative.   Psychiatric/Behavioral: Negative.    Objective:  BP (!) 166/90   Pulse 84   Temp 98.8 F (37.1 C)   Resp 16   Wt 246 lb (111.6 kg)   BMI 35.30 kg/m   Physical Exam  Constitutional: He is oriented to person, place, and time and well-developed, well-nourished, and in no distress.  HENT:  Head: Normocephalic and atraumatic.  Right Ear: External ear normal.  Left Ear: External ear normal.  Nose: Nose normal.  Eyes: Conjunctivae are normal. Pupils are equal, round, and reactive to light.  Neck: Normal range of motion. Neck supple.  Cardiovascular: Normal rate, regular rhythm, normal heart sounds and intact distal pulses.   No murmur heard. Pulmonary/Chest: Effort normal and breath sounds normal. No respiratory distress. He has no wheezes.  Abdominal: Soft.  Musculoskeletal: He exhibits no edema or tenderness.  Neurological: He is alert and oriented to person, place, and time. Gait normal.  Skin: Skin is warm and dry.  Psychiatric: Mood, memory, affect and judgment normal.    Assessment and Plan :  1. Essential (primary) hypertension Elevated today. Advised patient to check his b/p and we will re address on the next visit.  2.  Diabetes mellitus type 2 in obese (HCC) A1C 6.6. Stable. Continue current regimen. - POCT HgB A1C--6.6 today.  3. Need for hepatitis B vaccination - Hepatitis B vaccine adult IM  4. Need for pneumococcal vaccination  - Pneumococcal polysaccharide vaccine 23-valent greater than or equal to 2yo subcutaneous/IM  5. Excess weight/mild obesity work on habits. BMI is 35. Goal is a waist size of 36 or less. I think he is about 42 now.  HPI, Exam and A&P transcribed under direction and in the presence of Miguel Aschoff, MD. I have done the exam and reviewed the chart and it is  accurate to the best of my knowledge. Miguel Aschoff M.D. [facility]

## 2016-07-30 ENCOUNTER — Other Ambulatory Visit: Payer: Self-pay | Admitting: Family Medicine

## 2016-07-30 DIAGNOSIS — E1169 Type 2 diabetes mellitus with other specified complication: Secondary | ICD-10-CM

## 2016-07-30 DIAGNOSIS — E669 Obesity, unspecified: Secondary | ICD-10-CM

## 2016-07-30 DIAGNOSIS — I10 Essential (primary) hypertension: Secondary | ICD-10-CM

## 2016-08-13 ENCOUNTER — Other Ambulatory Visit: Payer: Self-pay

## 2016-08-13 ENCOUNTER — Telehealth: Payer: Self-pay | Admitting: Family Medicine

## 2016-08-13 DIAGNOSIS — B86 Scabies: Secondary | ICD-10-CM

## 2016-08-13 MED ORDER — PERMETHRIN 5 % EX CREA: 1.0000 "application " | TOPICAL_CREAM | Freq: Once | CUTANEOUS | 1 refills | Status: AC

## 2016-08-13 NOTE — Telephone Encounter (Signed)
Pt called wanting to know status of his request.  Thanks Con Memos

## 2016-08-13 NOTE — Telephone Encounter (Signed)
What type of exposure--in same room or sexual contact or in between?

## 2016-08-13 NOTE — Telephone Encounter (Signed)
Please review-aa 

## 2016-08-13 NOTE — Telephone Encounter (Signed)
Pt called saying he has had contact with someone who has scabies.  He wants to know what he needs to do and does he needs to go ahead and treat for it.  He uses [facility]

## 2016-08-13 NOTE — Progress Notes (Unsigned)
per

## 2016-08-13 NOTE — Telephone Encounter (Signed)
Patient states he was exposed to scabies sexual contact. His partner has scabies currently, which she contacted from daughter and is being treated.

## 2016-08-13 NOTE — Telephone Encounter (Signed)
Patient does not have any symptoms at this time. Please advise.

## 2016-10-13 ENCOUNTER — Ambulatory Visit: Payer: BLUE CROSS/BLUE SHIELD | Admitting: Family Medicine

## 2016-10-20 ENCOUNTER — Ambulatory Visit (INDEPENDENT_AMBULATORY_CARE_PROVIDER_SITE_OTHER): Payer: BLUE CROSS/BLUE SHIELD | Admitting: Family Medicine

## 2016-10-20 ENCOUNTER — Encounter: Payer: Self-pay | Admitting: Family Medicine

## 2016-10-20 VITALS — BP 142/82 | HR 84 | Temp 97.6°F | Resp 16 | Wt 248.0 lb

## 2016-10-20 DIAGNOSIS — I10 Essential (primary) hypertension: Secondary | ICD-10-CM

## 2016-10-20 DIAGNOSIS — E669 Obesity, unspecified: Secondary | ICD-10-CM | POA: Diagnosis not present

## 2016-10-20 DIAGNOSIS — E1169 Type 2 diabetes mellitus with other specified complication: Secondary | ICD-10-CM

## 2016-10-20 LAB — POCT GLYCOSYLATED HEMOGLOBIN (HGB A1C): Hemoglobin A1C: 7.6

## 2016-10-20 NOTE — Progress Notes (Signed)
Subjective:  HPI  Diabetes Mellitus Type II, Follow-up:   Lab Results  Component Value Date   HGBA1C 6.6 06/09/2016   HGBA1C 6.8 02/13/2016   HGBA1C 6.3 08/15/2015    Last seen for diabetes 4 months ago.  Management since then includes none. He reports good compliance with treatment. He is not having side effects.  Home blood sugar records: not being checked  Episodes of hypoglycemia? no   Current Insulin Regimen: n/a Current exercise: not too much right now, dealing with parents health and travel does not have much time to exercise.   Pertinent Labs:    Component Value Date/Time   CHOL 184 02/13/2016 1113   TRIG 394 (H) 02/13/2016 1113   HDL 30 (L) 02/13/2016 1113   LDLCALC 75 02/13/2016 1113   CREATININE 0.95 02/13/2016 1113    Wt Readings from Last 3 Encounters:  10/20/16 248 lb (112.5 kg)  06/09/16 246 lb (111.6 kg)  02/13/16 244 lb (110.7 kg)    ------------------------------------------------------------------------  Hypertension, follow-up:  BP Readings from Last 3 Encounters:  10/20/16 (!) 142/82  06/09/16 (!) 166/90  02/13/16 118/72    He was last seen for hypertension 4 months ago.  BP at that visit was 166/90. Management since that visit includes none. Elevated last OV. follow. He reports good compliance with treatment. He is not having side effects.  He is experiencing none.  Patient denies chest pain, chest pressure/discomfort, claudication, dyspnea, exertional chest pressure/discomfort, fatigue, irregular heart beat, lower extremity edema, near-syncope, orthopnea, palpitations, paroxysmal nocturnal dyspnea, syncope and tachypnea.    Wt Readings from Last 3 Encounters:  10/20/16 248 lb (112.5 kg)  06/09/16 246 lb (111.6 kg)  02/13/16 244 lb (110.7 kg)   ------------------------------------------------------------------------     Prior to Admission medications   Medication Sig Start Date End Date Taking? Authorizing Provider    glucose blood test strip 1 each by Other route as needed for other. Use as instructed   Yes Historical Provider, MD  lisinopril (PRINIVIL,ZESTRIL) 40 MG tablet TAKE 1 TABLET (40 MG TOTAL) BY MOUTH DAILY. 07/30/16  Yes Richard Maceo Pro., MD  metFORMIN (GLUCOPHAGE) 1000 MG tablet TAKE 1 TABLET BY MOUTH IN THE EVENING 07/30/16  Yes Richard Maceo Pro., MD  [facility] DELICA LANCETS 99991111 MISC 2 (two) times daily. for testing 05/18/15  Yes Historical Provider, MD    Patient Active Problem List   Diagnosis Date Noted  . Mass of thigh 09/11/2015  . Diabetes mellitus type 2 in obese ([place]) 02/12/2015  . Genital herpes 02/12/2015  . Essential (primary) hypertension 02/12/2015  . Excess weight 02/12/2015    Past Medical History:  Diagnosis Date  . Diabetes mellitus without complication ([place])   . Hypertension     Social History   Social History  . Marital status: Legally Separated    Spouse name: N/A  . Number of children: N/A  . Years of education: N/A   Occupational History  . Not on file.   Social History Main Topics  . Smoking status: Never Smoker  . Smokeless tobacco: Never Used  . Alcohol use 0.0 oz/week     Comment: 1/monthly  . Drug use: No  . Sexual activity: Not on file   Other Topics Concern  . Not on file   Social History Narrative  . No narrative on file    No Known Allergies  Review of Systems  Constitutional: Negative.   HENT: Negative.   Eyes: Negative.   Respiratory:  Negative.   Cardiovascular: Negative.   Gastrointestinal: Negative.   Genitourinary: Negative.   Musculoskeletal: Negative.   Skin: Negative.   Neurological: Negative.   Endo/Heme/Allergies: Negative.   Psychiatric/Behavioral: Negative.     Immunization History  Administered Date(s) Administered  . Hepatitis B, adult 06/09/2016  . Pneumococcal Polysaccharide-23 06/09/2016  . Tdap 07/04/2014    Objective:  BP (!) 142/82 (BP Location: Left Arm, Patient Position: Sitting, Cuff  Size: Large)   Pulse 84   Temp 97.6 F (36.4 C) (Oral)   Resp 16   Wt 248 lb (112.5 kg)   BMI 35.58 kg/m   Physical Exam  Constitutional: He is oriented to person, place, and time and well-developed, well-nourished, and in no distress.  HENT:  Head: Normocephalic and atraumatic.  Eyes: Conjunctivae and EOM are normal. Pupils are equal, round, and reactive to light. No scleral icterus.  Neck: Normal range of motion. Neck supple. No thyromegaly present.  Cardiovascular: Normal rate, regular rhythm, normal heart sounds and intact distal pulses.   Pulmonary/Chest: Effort normal and breath sounds normal.  Musculoskeletal: Normal range of motion.  Neurological: He is alert and oriented to person, place, and time. He has normal reflexes. Gait normal. GCS score is 15.  Skin: Skin is warm and dry.  Psychiatric: Mood, memory, affect and judgment normal.    Lab Results  Component Value Date   WBC 7.1 02/13/2016   HGB 14.4 07/05/2014   HCT 43.0 02/13/2016   PLT 183 02/13/2016   GLUCOSE 126 (H) 02/13/2016   CHOL 184 02/13/2016   TRIG 394 (H) 02/13/2016   HDL 30 (L) 02/13/2016   LDLCALC 75 02/13/2016   TSH 2.380 02/13/2016   HGBA1C 6.6 06/09/2016   MICROALBUR 50 08/15/2015    CMP     Component Value Date/Time   NA 139 02/13/2016 1113   K 4.5 02/13/2016 1113   CL 98 02/13/2016 1113   CO2 24 02/13/2016 1113   GLUCOSE 126 (H) 02/13/2016 1113   BUN 15 02/13/2016 1113   CREATININE 0.95 02/13/2016 1113   CALCIUM 9.9 02/13/2016 1113   PROT 7.4 02/13/2016 1113   ALBUMIN 4.6 02/13/2016 1113   AST 15 02/13/2016 1113   ALT 25 02/13/2016 1113   ALKPHOS 82 02/13/2016 1113   BILITOT 0.7 02/13/2016 1113   GFRNONAA 88 02/13/2016 1113   GFRAA 102 02/13/2016 1113    Assessment and Plan :  1. Diabetes mellitus type 2 in obese (HCC)  - POCT HgB A1C 7.6 today. Elevated pt will work on diet and exercise. Elevation is more than likely due to holidays and recent stress.   2. Essential  (primary) hypertension Still elevated. May need to add medication at that time. Pt will work on diet and exercise.  3.Obesity Weight loss stressed. HPI, Exam, and A&P Transcribed under the direction and in the presence of Richard L. Cranford Mon, MD  Electronically Signed: Katina Dung, CMA I have done the exam and reviewed the above chart and it is accurate to the best of my knowledge. Development worker, community has been used in this note in any air is in the dictation or transcription are unintentional.  [facility] 10/20/2016 10:47 AM

## 2017-02-17 ENCOUNTER — Encounter: Payer: BLUE CROSS/BLUE SHIELD | Admitting: Family Medicine

## 2017-03-24 ENCOUNTER — Encounter: Payer: BLUE CROSS/BLUE SHIELD | Admitting: Family Medicine

## 2017-04-01 ENCOUNTER — Encounter: Payer: Self-pay | Admitting: Family Medicine

## 2017-04-01 ENCOUNTER — Ambulatory Visit (INDEPENDENT_AMBULATORY_CARE_PROVIDER_SITE_OTHER): Payer: BLUE CROSS/BLUE SHIELD | Admitting: Family Medicine

## 2017-04-01 VITALS — BP 160/88 | HR 82 | Temp 97.6°F | Resp 16 | Ht 70.0 in | Wt 249.0 lb

## 2017-04-01 DIAGNOSIS — I10 Essential (primary) hypertension: Secondary | ICD-10-CM

## 2017-04-01 DIAGNOSIS — Z125 Encounter for screening for malignant neoplasm of prostate: Secondary | ICD-10-CM

## 2017-04-01 DIAGNOSIS — Z1211 Encounter for screening for malignant neoplasm of colon: Secondary | ICD-10-CM

## 2017-04-01 DIAGNOSIS — E669 Obesity, unspecified: Secondary | ICD-10-CM

## 2017-04-01 DIAGNOSIS — E1169 Type 2 diabetes mellitus with other specified complication: Secondary | ICD-10-CM

## 2017-04-01 DIAGNOSIS — Z0000 Encounter for general adult medical examination without abnormal findings: Secondary | ICD-10-CM | POA: Diagnosis not present

## 2017-04-01 LAB — POCT URINALYSIS DIPSTICK
Bilirubin, UA: NEGATIVE
Blood, UA: NEGATIVE
Glucose, UA: NEGATIVE
Ketones, UA: NEGATIVE
Leukocytes, UA: NEGATIVE
Nitrite, UA: NEGATIVE
Protein, UA: NEGATIVE
Spec Grav, UA: 1.02 (ref 1.010–1.025)
Urobilinogen, UA: 0.2 E.U./dL
pH, UA: 6 (ref 5.0–8.0)

## 2017-04-01 LAB — IFOBT (OCCULT BLOOD): IFOBT: NEGATIVE

## 2017-04-01 NOTE — Progress Notes (Signed)
Patient: Reginald Murphy, Male    DOB: [birth year], 61 y.o.   MRN: [id number] Visit Date: 04/01/2017  Today's Provider: [name], MD   Chief Complaint  Patient presents with  . Annual Exam   Subjective:    Annual physical exam Reginald Murphy is a 61 y.o. male who presents today for health maintenance and complete physical. He feels fairly well. He reports exercising, when he can. He reports he is sleeping fairly well, considering he is living with his parents to take care of them. Gettied remarried next month. He has a 59 yo and 59 yo daughter by first marriage. -----------------------------------------------------------------   Colonoscopy- never    Immunization History  Administered Date(s) Administered  . Hepatitis B, adult 06/09/2016  . Pneumococcal Polysaccharide-23 06/09/2016  . Tdap 07/04/2014     Review of Systems  Constitutional: Negative.   HENT: Negative.   Eyes: Negative.   Respiratory: Negative.   Cardiovascular: Negative.   Gastrointestinal: Negative.   Endocrine: Negative.   Genitourinary: Negative.   Musculoskeletal: Negative.   Skin: Negative.   Allergic/Immunologic: Negative.   Neurological: Negative.   Hematological: Negative.   Psychiatric/Behavioral: Negative.     Social History      He  reports that he has never smoked. He has never used smokeless tobacco. He reports that he drinks alcohol. He reports that he does not use drugs.       Social History   Social History  . Marital status: Legally Separated    Spouse name: N/A  . Number of children: N/A  . Years of education: N/A   Social History Main Topics  . Smoking status: Never Smoker  . Smokeless tobacco: Never Used  . Alcohol use 0.0 oz/week     Comment: occasionally  . Drug use: No  . Sexual activity: Not Asked   Other Topics Concern  . None   Social History Narrative  . None    Past Medical History:  Diagnosis Date  . Diabetes mellitus without  complication ([place])   . Hypertension      Patient Active Problem List   Diagnosis Date Noted  . Mass of thigh 09/11/2015  . Diabetes mellitus type 2 in obese ([place]) 02/12/2015  . Genital herpes 02/12/2015  . Essential (primary) hypertension 02/12/2015  . Excess weight 02/12/2015    History reviewed. No pertinent surgical history.  Family History        Family Status  Relation Status  . Mother Alive  . Father Alive  . MGM (Not Specified)  . MGF (Not Specified)        His family history includes Cancer in his maternal grandfather; Diabetes in his mother; Heart attack in his maternal grandmother; Seizures in his father.     No Known Allergies   Current Outpatient Prescriptions:  .  lisinopril (PRINIVIL,ZESTRIL) 40 MG tablet, TAKE 1 TABLET (40 MG TOTAL) BY MOUTH DAILY., Disp: 90 tablet, Rfl: 3 .  metFORMIN (GLUCOPHAGE) 1000 MG tablet, TAKE 1 TABLET BY MOUTH IN THE EVENING, Disp: 90 tablet, Rfl: 3 .  glucose blood test strip, 1 each by Other route as needed for other. Use as instructed, Disp: , Rfl:  .  ONETOUCH DELICA LANCETS 44W MISC, 2 (two) times daily. for testing, Disp: , Rfl: 12   Patient Care Team: Jerrol Banana., MD as PCP - General (Family Medicine) Jerrol Banana., MD (Family Medicine) Bary Castilla Forest Gleason, MD (General Surgery)  Objective:   Vitals: BP (!) 160/88   Pulse 82   Temp 97.6 F (36.4 C) (Oral)   Resp 16   Ht 5\' 10"  (1.778 m)   Wt 249 lb (112.9 kg)   BMI 35.73 kg/m    Vitals:   04/01/17 0919 04/01/17 0927  BP: (!) 166/98 (!) 160/88  Pulse: 82   Resp: 16   Temp: 97.6 F (36.4 C)   TempSrc: Oral   Weight: 249 lb (112.9 kg)   Height: 5\' 10"  (1.778 m)      Physical Exam  Constitutional: He is oriented to person, place, and time. He appears well-developed and well-nourished.  HENT:  Head: Normocephalic and atraumatic.  Right Ear: External ear normal.  Left Ear: External ear normal.  Nose: Nose normal.  Mouth/Throat:  Oropharynx is clear and moist.  Eyes: Pupils are equal, round, and reactive to light. Conjunctivae and EOM are normal.  Neck: Normal range of motion. Neck supple.  Cardiovascular: Normal rate, regular rhythm, normal heart sounds and intact distal pulses.   Pulmonary/Chest: Effort normal and breath sounds normal.  Abdominal: Soft. Bowel sounds are normal.  Posible very  small umbilical hernia.  Genitourinary: Rectum normal, prostate normal and penis normal.  Musculoskeletal: Normal range of motion.  Neurological: He is alert and oriented to person, place, and time. He has normal reflexes.  Skin: Skin is warm and dry.  Psychiatric: He has a normal mood and affect. His behavior is normal. Judgment and thought content normal.     Depression Screen PHQ 2/9 Scores 04/01/2017 04/01/2017  PHQ - 2 Score 0 0  PHQ- 9 Score 3 -      Assessment & Plan:     Routine Health Maintenance and Physical Exam  Exercise Activities and Dietary recommendations Goals    None      Immunization History  Administered Date(s) Administered  . Hepatitis B, adult 06/09/2016  . Pneumococcal Polysaccharide-23 06/09/2016  . Tdap 07/04/2014    Health Maintenance  Topic Date Due  . Hepatitis C Screening  08-10-1956  . FOOT EXAM  04/05/1968  . OPHTHALMOLOGY EXAM  04/05/1968  . HIV Screening  04/05/1973  . COLONOSCOPY  04/05/2008  . INFLUENZA VACCINE  04/08/2017  . HEMOGLOBIN A1C  04/19/2017  . PNEUMOCOCCAL POLYSACCHARIDE VACCINE (2) 06/09/2021  . TETANUS/TDAP  07/04/2024     Discussed health benefits of physical activity, and encouraged him to engage in regular exercise appropriate for his age and condition.  Obesity TIIDM   --------------------------------------------------------------------   I have done the exam and reviewed the above chart and it is accurate to the best of my knowledge. Development worker, community has been used in this note in any air is in the dictation or transcription are  unintentional.  [name], MD  [name]

## 2017-04-02 LAB — CBC WITH DIFFERENTIAL/PLATELET
Basophils Absolute: 0 10*3/uL (ref 0.0–0.2)
Basos: 0 %
EOS (ABSOLUTE): 0.2 10*3/uL (ref 0.0–0.4)
Eos: 3 %
Hematocrit: 43.6 % (ref 37.5–51.0)
Hemoglobin: 14.7 g/dL (ref 13.0–17.7)
Immature Grans (Abs): 0 10*3/uL (ref 0.0–0.1)
Immature Granulocytes: 1 %
Lymphocytes Absolute: 2.5 10*3/uL (ref 0.7–3.1)
Lymphs: 37 %
MCH: 28.9 pg (ref 26.6–33.0)
MCHC: 33.7 g/dL (ref 31.5–35.7)
MCV: 86 fL (ref 79–97)
Monocytes Absolute: 0.5 10*3/uL (ref 0.1–0.9)
Monocytes: 8 %
Neutrophils Absolute: 3.4 10*3/uL (ref 1.4–7.0)
Neutrophils: 51 %
Platelets: 189 10*3/uL (ref 150–379)
RBC: 5.08 x10E6/uL (ref 4.14–5.80)
RDW: 13.3 % (ref 12.3–15.4)
WBC: 6.7 10*3/uL (ref 3.4–10.8)

## 2017-04-02 LAB — COMPREHENSIVE METABOLIC PANEL
ALT: 25 IU/L (ref 0–44)
AST: 19 IU/L (ref 0–40)
Albumin/Globulin Ratio: 1.7 (ref 1.2–2.2)
Albumin: 4.7 g/dL (ref 3.5–5.5)
Alkaline Phosphatase: 71 IU/L (ref 39–117)
BUN/Creatinine Ratio: 15 (ref 9–20)
BUN: 15 mg/dL (ref 6–24)
Bilirubin Total: 0.5 mg/dL (ref 0.0–1.2)
CO2: 24 mmol/L (ref 20–29)
Calcium: 9.6 mg/dL (ref 8.7–10.2)
Chloride: 102 mmol/L (ref 96–106)
Creatinine, Ser: 1.02 mg/dL (ref 0.76–1.27)
GFR calc Af Amer: 93 mL/min/{1.73_m2} (ref >59)
GFR calc non Af Amer: 81 mL/min/{1.73_m2} (ref >59)
Globulin, Total: 2.8 g/dL (ref 1.5–4.5)
Glucose: 143 mg/dL — ABNORMAL HIGH (ref 65–99)
Potassium: 4.4 mmol/L (ref 3.5–5.2)
Sodium: 140 mmol/L (ref 134–144)
Total Protein: 7.5 g/dL (ref 6.0–8.5)

## 2017-04-02 LAB — LIPID PANEL WITH LDL/HDL RATIO
Cholesterol, Total: 185 mg/dL (ref 100–199)
HDL: 30 mg/dL — ABNORMAL LOW (ref >39)
Triglycerides: 448 mg/dL — ABNORMAL HIGH (ref 0–149)

## 2017-04-02 LAB — PSA: Prostate Specific Ag, Serum: 0.7 ng/mL (ref 0.0–4.0)

## 2017-04-02 LAB — TSH: TSH: 2.47 u[IU]/mL (ref 0.450–4.500)

## 2017-04-02 LAB — HEMOGLOBIN A1C
Est. average glucose Bld gHb Est-mCnc: 169 mg/dL
Hgb A1c MFr Bld: 7.5 % — ABNORMAL HIGH (ref 4.8–5.6)

## 2017-07-17 ENCOUNTER — Other Ambulatory Visit: Payer: Self-pay | Admitting: Family Medicine

## 2017-07-17 DIAGNOSIS — I10 Essential (primary) hypertension: Secondary | ICD-10-CM

## 2017-07-17 DIAGNOSIS — E669 Obesity, unspecified: Secondary | ICD-10-CM

## 2017-07-17 DIAGNOSIS — E1169 Type 2 diabetes mellitus with other specified complication: Secondary | ICD-10-CM

## 2017-07-22 ENCOUNTER — Ambulatory Visit: Payer: Self-pay | Admitting: Family Medicine

## 2017-08-18 ENCOUNTER — Ambulatory Visit: Payer: Self-pay | Admitting: Family Medicine

## 2017-09-10 ENCOUNTER — Ambulatory Visit (INDEPENDENT_AMBULATORY_CARE_PROVIDER_SITE_OTHER): Payer: BLUE CROSS/BLUE SHIELD | Admitting: Family Medicine

## 2017-09-10 ENCOUNTER — Other Ambulatory Visit: Payer: Self-pay

## 2017-09-10 VITALS — BP 132/70 | HR 116 | Temp 98.4°F | Resp 16 | Wt 246.0 lb

## 2017-09-10 DIAGNOSIS — E1169 Type 2 diabetes mellitus with other specified complication: Secondary | ICD-10-CM

## 2017-09-10 DIAGNOSIS — E119 Type 2 diabetes mellitus without complications: Secondary | ICD-10-CM | POA: Diagnosis not present

## 2017-09-10 DIAGNOSIS — E785 Hyperlipidemia, unspecified: Secondary | ICD-10-CM

## 2017-09-10 LAB — POCT GLYCOSYLATED HEMOGLOBIN (HGB A1C): Hemoglobin A1C: 7.4

## 2017-09-10 NOTE — Progress Notes (Signed)
Reginald Murphy  MRN: [id number] DOB: [birth year]  Subjective:  HPI   The patient is a 62 year old male who presents for follow up of chronic illness.  He was last seen on 04/01/17. Pt remarried last year. Diabetes-the patient was last seen in July and had an A1C of 7.5.  The patient has not been checking his glucose at home.  He has been vacationing recently and that coupled with the holidays, he feels his level will be elevated.  Hypertension-Patient was last seen in July.  His blood pressure at that time was stable and no changes were made.  Patient Active Problem List   Diagnosis Date Noted  . Mass of thigh 09/11/2015  . Diabetes mellitus type 2 in obese ([place]) 02/12/2015  . Genital herpes 02/12/2015  . Essential (primary) hypertension 02/12/2015  . Excess weight 02/12/2015    Past Medical History:  Diagnosis Date  . Diabetes mellitus without complication ([place])   . Hypertension     Social History   Socioeconomic History  . Marital status: Legally Separated    Spouse name: Not on file  . Number of children: Not on file  . Years of education: Not on file  . Highest education level: Not on file  Social Needs  . Financial resource strain: Not on file  . Food insecurity - worry: Not on file  . Food insecurity - inability: Not on file  . Transportation needs - medical: Not on file  . Transportation needs - non-medical: Not on file  Occupational History  . Not on file  Tobacco Use  . Smoking status: Never Smoker  . Smokeless tobacco: Never Used  Substance and Sexual Activity  . Alcohol use: Yes    Alcohol/week: 0.0 oz    Comment: occasionally  . Drug use: No  . Sexual activity: Not on file  Other Topics Concern  . Not on file  Social History Narrative  . Not on file    Outpatient Encounter Medications as of 09/10/2017  Medication Sig Note  . glucose blood test strip 1 each by Other route as needed for other. Use as instructed   . lisinopril (PRINIVIL,ZESTRIL) 40  MG tablet TAKE 1 TABLET (40 MG TOTAL) BY MOUTH DAILY.   . metFORMIN (GLUCOPHAGE) 1000 MG tablet TAKE 1 TABLET BY MOUTH IN THE EVENING   . ONETOUCH DELICA LANCETS 95J MISC 2 (two) times daily. for testing 08/15/2015: Received from: External Pharmacy   No facility-administered encounter medications on file as of 09/10/2017.     No Known Allergies  Review of Systems  Constitutional: Negative for fever and malaise/fatigue.  Eyes: Negative.   Respiratory: Negative for cough, shortness of breath and wheezing.   Cardiovascular: Negative for chest pain, palpitations, orthopnea, claudication and leg swelling.  Gastrointestinal: Negative.   Genitourinary: Negative for frequency.  Skin: Negative.   Neurological: Negative for weakness.  Endo/Heme/Allergies: Negative for polydipsia.  Psychiatric/Behavioral: Negative.     Objective:  BP 132/70 (BP Location: Right Arm, Patient Position: Sitting, Cuff Size: Normal)   Pulse (!) 116   Temp 98.4 F (36.9 C) (Oral)   Resp 16   Wt 246 lb (111.6 kg)   BMI 35.30 kg/m   Physical Exam  Constitutional: He is oriented to person, place, and time and well-developed, well-nourished, and in no distress.  HENT:  Head: Normocephalic and atraumatic.  Eyes: Conjunctivae are normal. Pupils are equal, round, and reactive to light. No scleral icterus.  Neck: Normal range of motion.  No thyromegaly present.  Cardiovascular: Normal rate, regular rhythm and normal heart sounds.  Pulmonary/Chest: Effort normal and breath sounds normal.  Abdominal: Soft.  Neurological: He is alert and oriented to person, place, and time.  Skin: Skin is warm and dry.  Psychiatric: Mood, memory, affect and judgment normal.    Assessment and Plan :  1. Hyperlipidemia associated with type 2 diabetes mellitus ([place])  - Lipid Panel With LDL/HDL Ratio  2. Diabetes mellitus without complication ([place]) Fair control - POCT glycosylated hemoglobin (Hb A1C)--7.4 today.  3.HTN Mild  Tachycardia--recheck HR around 90. ECG next OV as pt asymptomatic and regular rhythm.  I have done the exam and reviewed the chart and it is accurate to the best of my knowledge. Development worker, community has been used and  any errors in dictation or transcription are unintentional. Miguel Aschoff M.D. [facility]

## 2017-09-13 ENCOUNTER — Encounter: Payer: Self-pay | Admitting: Family Medicine

## 2017-10-19 ENCOUNTER — Encounter: Payer: Self-pay | Admitting: Family Medicine

## 2017-10-19 ENCOUNTER — Ambulatory Visit (INDEPENDENT_AMBULATORY_CARE_PROVIDER_SITE_OTHER): Payer: BLUE CROSS/BLUE SHIELD | Admitting: Family Medicine

## 2017-10-19 VITALS — BP 136/80 | HR 96 | Temp 98.1°F | Resp 20 | Wt 247.0 lb

## 2017-10-19 DIAGNOSIS — J069 Acute upper respiratory infection, unspecified: Secondary | ICD-10-CM

## 2017-10-19 MED ORDER — HYDROCODONE-HOMATROPINE 5-1.5 MG/5ML PO SYRP: ORAL_SOLUTION | ORAL | 0 refills | Status: DC

## 2017-10-19 NOTE — Patient Instructions (Signed)
Discussed use of Mucinex D for congestion and Delsym for cough. 

## 2017-10-19 NOTE — Progress Notes (Signed)
Subjective:     Patient ID: Reginald Murphy, male   DOB: 1956-01-18, 62 y.o.   MRN: [id number] Chief Complaint  Patient presents with  . URI    Started about three days ago   HPI Reports cold sx. S.O.who accompanies is being treated for pneumonia following the flu. No fever, chills, or sore throat.  Review of Systems     Objective:   Physical Exam  Constitutional: He appears well-developed and well-nourished.  Ears: T.M's intact without inflammation Sinuses: non-tender Throat: moderate tonsillar enlargement without erythema or exudate Neck: no cervical adenopathy Lungs: clear     Assessment:    1. Viral upper respiratory tract infection - HYDROcodone-homatropine (HYCODAN) 5-1.5 MG/5ML syrup; 5 ml 4-6 hours as needed for cough  Dispense: 120 mL; Refill: 0    Plan:    Discussed use of Mucinex D and Delsym.

## 2017-12-16 ENCOUNTER — Ambulatory Visit: Payer: Self-pay | Admitting: Family Medicine

## 2017-12-22 ENCOUNTER — Ambulatory Visit: Payer: Self-pay | Admitting: Family Medicine

## 2017-12-31 ENCOUNTER — Ambulatory Visit: Payer: BLUE CROSS/BLUE SHIELD | Admitting: Family Medicine

## 2018-01-07 ENCOUNTER — Ambulatory Visit: Payer: BLUE CROSS/BLUE SHIELD | Admitting: Family Medicine

## 2018-02-22 ENCOUNTER — Ambulatory Visit (INDEPENDENT_AMBULATORY_CARE_PROVIDER_SITE_OTHER): Payer: BLUE CROSS/BLUE SHIELD | Admitting: Family Medicine

## 2018-02-22 ENCOUNTER — Ambulatory Visit: Payer: BLUE CROSS/BLUE SHIELD | Admitting: Family Medicine

## 2018-02-22 VITALS — BP 172/94 | HR 90 | Temp 98.2°F | Resp 16 | Wt 248.0 lb

## 2018-02-22 DIAGNOSIS — E669 Obesity, unspecified: Secondary | ICD-10-CM | POA: Diagnosis not present

## 2018-02-22 DIAGNOSIS — E1169 Type 2 diabetes mellitus with other specified complication: Secondary | ICD-10-CM

## 2018-02-22 DIAGNOSIS — I10 Essential (primary) hypertension: Secondary | ICD-10-CM

## 2018-02-22 DIAGNOSIS — R000 Tachycardia, unspecified: Secondary | ICD-10-CM

## 2018-02-22 LAB — POCT UA - MICROALBUMIN: Microalbumin Ur, POC: 20 mg/L

## 2018-02-22 MED ORDER — AMLODIPINE BESYLATE 5 MG PO TABS
5.0000 mg | ORAL_TABLET | Freq: Every day | ORAL | 3 refills | Status: DC
Start: 2018-02-22 — End: 2019-04-15

## 2018-02-22 NOTE — Progress Notes (Signed)
Reginald Murphy  MRN: [id number] DOB: 08-05-1956  Subjective:  HPI   The patient is a 62 year old male who presents for follow up of his diabetes and hypertension.  The patient was lst seen on 09/10/17.  Diabetes-His last A1C was on his last visit and it was 7.4.  He has not been checking his glucose at home.  On his last visit he was to have his lipids checked but has not had this done yet.  Hypertension-On the last few visits that patient has had stable blood pressure and no changes have been made.  However his heart rate had been elevated on his last visit and it was noted that we would check his EKG on today's visit as the patient was not symptomatic at the time of last visit. The patient's blood pressure is elevated today and he states he is under increased stress at work.  Patient Active Problem List   Diagnosis Date Noted  . Hyperlipidemia associated with type 2 diabetes mellitus ([place]) 09/10/2017  . Mass of thigh 09/11/2015  . Diabetes mellitus type 2 in obese ([place]) 02/12/2015  . Genital herpes 02/12/2015  . Essential (primary) hypertension 02/12/2015  . Excess weight 02/12/2015    Past Medical History:  Diagnosis Date  . Diabetes mellitus without complication ([place])   . Hypertension     Social History   Socioeconomic History  . Marital status: Legally Separated    Spouse name: Not on file  . Number of children: Not on file  . Years of education: Not on file  . Highest education level: Not on file  Occupational History  . Not on file  Social Needs  . Financial resource strain: Not on file  . Food insecurity:    Worry: Not on file    Inability: Not on file  . Transportation needs:    Medical: Not on file    Non-medical: Not on file  Tobacco Use  . Smoking status: Never Smoker  . Smokeless tobacco: Never Used  Substance and Sexual Activity  . Alcohol use: Yes    Alcohol/week: 0.0 oz    Comment: occasionally  . Drug use: No  . Sexual activity: Not on file    Lifestyle  . Physical activity:    Days per week: Not on file    Minutes per session: Not on file  . Stress: Not on file  Relationships  . Social connections:    Talks on phone: Not on file    Gets together: Not on file    Attends religious service: Not on file    Active member of club or organization: Not on file    Attends meetings of clubs or organizations: Not on file    Relationship status: Not on file  . Intimate partner violence:    Fear of current or ex partner: Not on file    Emotionally abused: Not on file    Physically abused: Not on file    Forced sexual activity: Not on file  Other Topics Concern  . Not on file  Social History Narrative  . Not on file    Outpatient Encounter Medications as of 02/22/2018  Medication Sig Note  . glucose blood test strip 1 each by Other route as needed for other. Use as instructed   . lisinopril (PRINIVIL,ZESTRIL) 40 MG tablet TAKE 1 TABLET (40 MG TOTAL) BY MOUTH DAILY.   . metFORMIN (GLUCOPHAGE) 1000 MG tablet TAKE 1 TABLET BY MOUTH IN THE EVENING   .  ONETOUCH DELICA LANCETS 67O MISC 2 (two) times daily. for testing 08/15/2015: Received from: External Pharmacy  . [DISCONTINUED] HYDROcodone-homatropine (HYCODAN) 5-1.5 MG/5ML syrup 5 ml 4-6 hours as needed for cough    No facility-administered encounter medications on file as of 02/22/2018.     No Known Allergies  Review of Systems  Constitutional: Negative for fever and malaise/fatigue.  Eyes: Negative.   Respiratory: Negative for cough and shortness of breath.   Cardiovascular: Negative for chest pain, palpitations, orthopnea, claudication and leg swelling.  Gastrointestinal: Negative.   Skin: Negative.   Neurological: Negative for dizziness and headaches.  Psychiatric/Behavioral: Negative.     Objective:  BP (!) 172/94 (BP Location: Right Arm, Patient Position: Sitting, Cuff Size: Normal)   Pulse 90   Temp 98.2 F (36.8 C) (Oral)   Resp 16   Wt 248 lb (112.5 kg)   SpO2  97%   BMI 35.58 kg/m   Physical Exam  Constitutional: He is oriented to person, place, and time and well-developed, well-nourished, and in no distress.  HENT:  Head: Normocephalic and atraumatic.  Eyes: Conjunctivae are normal. No scleral icterus.  Neck: No thyromegaly present.  Cardiovascular: Normal rate, regular rhythm and normal heart sounds.  Pulmonary/Chest: Effort normal and breath sounds normal.  Abdominal: Soft.  Musculoskeletal: He exhibits no edema.  Neurological: He is alert and oriented to person, place, and time. Gait normal. GCS score is 15.  Monofilament foot exam normal.  Skin: Skin is warm and dry.  Psychiatric: Mood, memory, affect and judgment normal.    Assessment and Plan :  1. Tachycardia May need BB or Cardiazem. - EKG 12-Lead  2. Diabetes mellitus type 2 in obese (HCC) Pt to start checking FBS and work on Lifestyle. - POCT UA - Microalbumin - POCT glycosylated hemoglobin (Hb A1C)--7.7  3. Essential (primary) hypertension - amLODipine (NORVASC) 5 MG tablet; Take 1 tablet (5 mg total) by mouth daily.  Dispense: 90 tablet; Refill: 3 RTC 1 month. 4.HLD  I have done the exam and reviewed the chart and it is accurate to the best of my knowledge. Development worker, community has been used and  any errors in dictation or transcription are unintentional. Miguel Aschoff M.D. [facility]

## 2018-02-23 LAB — POCT GLYCOSYLATED HEMOGLOBIN (HGB A1C): Hemoglobin A1C: 7.7 % — AB (ref 4.0–5.6)

## 2018-03-09 ENCOUNTER — Other Ambulatory Visit: Payer: Self-pay | Admitting: Family Medicine

## 2018-03-09 ENCOUNTER — Other Ambulatory Visit: Payer: Self-pay

## 2018-03-09 ENCOUNTER — Telehealth: Payer: Self-pay | Admitting: Family Medicine

## 2018-03-09 MED ORDER — GLUCOSE BLOOD VI STRP
ORAL_STRIP | 12 refills | Status: DC
Start: 2018-03-09 — End: 2018-03-09

## 2018-03-09 MED ORDER — ACCU-CHEK NANO SMARTVIEW W/DEVICE KIT: 1.0000 | PACK | Freq: Every day | 0 refills | Status: AC

## 2018-03-09 NOTE — Telephone Encounter (Signed)
Patient received a call from pharmacy and his ins. does not cover a One touch Verio Flex glucometer.  Please call in an Accucheck glucometer, strips and lancets to [facility] on [address].

## 2018-04-26 ENCOUNTER — Encounter: Payer: Self-pay | Admitting: Family Medicine

## 2018-05-28 ENCOUNTER — Ambulatory Visit: Payer: BLUE CROSS/BLUE SHIELD | Admitting: Podiatry

## 2018-05-28 ENCOUNTER — Ambulatory Visit (INDEPENDENT_AMBULATORY_CARE_PROVIDER_SITE_OTHER): Payer: BLUE CROSS/BLUE SHIELD

## 2018-05-28 ENCOUNTER — Encounter: Payer: Self-pay | Admitting: Podiatry

## 2018-05-28 DIAGNOSIS — M779 Enthesopathy, unspecified: Secondary | ICD-10-CM

## 2018-05-28 DIAGNOSIS — S99922A Unspecified injury of left foot, initial encounter: Secondary | ICD-10-CM

## 2018-05-28 DIAGNOSIS — M7752 Other enthesopathy of left foot: Secondary | ICD-10-CM

## 2018-05-31 NOTE — Progress Notes (Signed)
   HPI: 62 year old male with PMHx of DM presenting today as a new patient with a chief complaint of aching pain to the left hallux that began three days ago after stumping the toe. He reports associated swelling of the toe and states the pain is located at the 1st MPJ. Walking increases the pain. He has been taking Advil and icing the area for treatment. Patient is here for further evaluation and treatment.   Past Medical History:  Diagnosis Date  . Diabetes mellitus without complication ([place])   . Hypertension      Physical Exam: General: The patient is alert and oriented x3 in no acute distress.  Dermatology: Skin is warm, dry and supple bilateral lower extremities. Negative for open lesions or macerations.  Vascular: Palpable pedal pulses bilaterally. No edema or erythema noted. Capillary refill within normal limits.  Neurological: Epicritic and protective threshold diminished bilaterally.   Musculoskeletal Exam: Pain with palpation to the 1st MPJ of the left foot. Range of motion within normal limits to all pedal and ankle joints bilateral. Muscle strength 5/5 in all groups bilateral.   Radiographic Exam:  Normal osseous mineralization. Joint spaces preserved. No fracture/dislocation/boney destruction.    Assessment: 1. 1st MPJ capsulitis left foot secondary to stubbing injury    Plan of Care:  1. Patient evaluated. X-Rays reviewed.  2. Injection of 0.5 mLs Celestone Soluspan injected into the 1st MPJ of the left foot.  3. Compression anklet dispensed.  4. Post op shoe dispensed.  5. Continue taking OTC Advil.  6. Return to clinic as needed.       Edrick Kins, DPM [facility]  Dr. Edrick Kins, DPM    [address], [place] [address]                Office [phone]  Fax [phone]

## 2018-07-01 ENCOUNTER — Encounter: Payer: Self-pay | Admitting: Family Medicine

## 2018-07-01 ENCOUNTER — Ambulatory Visit (INDEPENDENT_AMBULATORY_CARE_PROVIDER_SITE_OTHER): Payer: BLUE CROSS/BLUE SHIELD | Admitting: Family Medicine

## 2018-07-01 VITALS — BP 171/93 | HR 84 | Temp 98.5°F | Resp 16 | Wt 247.0 lb

## 2018-07-01 DIAGNOSIS — E669 Obesity, unspecified: Secondary | ICD-10-CM | POA: Diagnosis not present

## 2018-07-01 DIAGNOSIS — E1169 Type 2 diabetes mellitus with other specified complication: Secondary | ICD-10-CM

## 2018-07-01 DIAGNOSIS — I10 Essential (primary) hypertension: Secondary | ICD-10-CM | POA: Diagnosis not present

## 2018-07-01 DIAGNOSIS — E785 Hyperlipidemia, unspecified: Secondary | ICD-10-CM | POA: Diagnosis not present

## 2018-07-01 LAB — POCT GLYCOSYLATED HEMOGLOBIN (HGB A1C): Hemoglobin A1C: 7.7 % — AB (ref 4.0–5.6)

## 2018-07-01 MED ORDER — METFORMIN HCL 1000 MG PO TABS: 1000.0000 mg | ORAL_TABLET | Freq: Two times a day (BID) | ORAL | 3 refills | Status: DC

## 2018-07-01 NOTE — Progress Notes (Signed)
Patient: Reginald Murphy Male    DOB: 1955/11/09   62 y.o.   MRN: [id number] Visit Date: 07/01/2018  Today's Provider: [name], MD   Chief Complaint  Patient presents with  . Diabetes  . Hypertension  . Hyperlipidemia   Subjective:    HPI      Diabetes Mellitus Type II, Follow-up:   Lab Results  Component Value Date   HGBA1C 7.7 (A) 07/01/2018   HGBA1C 7.7 (A) 02/23/2018   HGBA1C 7.4 09/10/2017   Last seen for diabetes 4 months ago.  Management since then includes checking fasting blood sugar at home and lifestyle changes. He reports excellent compliance with treatment. He is not having side effects.  Current symptoms include none and have been stable. Home blood sugar records: average in the range of 150s  Episodes of hypoglycemia? no   Current Insulin Regimen: N/A Most Recent Eye Exam: scheduled for next week [facility]) Weight trend: stable Prior visit with dietician: no Current diet: in general, an "unhealthy" diet Current exercise: none  ------------------------------------------------------------------------   Hypertension, follow-up:  BP Readings from Last 3 Encounters:  07/01/18 (!) 171/93  02/22/18 (!) 172/94  10/19/17 136/80    He was last seen for hypertension 4 months ago.  BP at that visit was 172/94. Management since that visit includes none.He reports excellent compliance with treatment. He is not having side effects.  He is not exercising. He is not adherent to low salt diet.   Outside blood pressures are not being checked. He is experiencing chest pain, chest pressure/discomfort, claudication, dyspnea, exertional chest pressure/discomfort, fatigue, irregular heart beat, lower extremity edema, near-syncope, orthopnea, palpitations, paroxysmal nocturnal dyspnea, syncope and tachypnea.  Patient denies chest pain, chest pressure/discomfort, claudication, dyspnea, exertional chest pressure/discomfort, fatigue,  irregular heart beat, lower extremity edema, near-syncope, orthopnea, palpitations, paroxysmal nocturnal dyspnea, syncope and tachypnea.   Cardiovascular risk factors include advanced age (older than 7 for men, 67 for women), diabetes mellitus, hypertension, male gender and obesity (BMI >= 30 kg/m2).  Use of agents associated with hypertension: none.   ------------------------------------------------------------------------    Lipid/Cholesterol, Follow-up:   Last seen for this 4 months ago.  Management since that visit includes none.  Last Lipid Panel:    Component Value Date/Time   CHOL 185 04/01/2017 1033   TRIG 448 (H) 04/01/2017 1033   HDL 30 (L) 04/01/2017 1033   [place] Comment 04/01/2017 1033    He reports excellent compliance with treatment. He is not having side effects.   Wt Readings from Last 3 Encounters:  07/01/18 247 lb (112 kg)  02/22/18 248 lb (112.5 kg)  10/19/17 247 lb (112 kg)    ------------------------------------------------------------------------   No Known Allergies   Current Outpatient Medications:  .  ACCU-CHEK GUIDE test strip, Please specify directions, refills and quantity, Disp: 100 each, Rfl: 11 .  amLODipine (NORVASC) 5 MG tablet, Take 1 tablet (5 mg total) by mouth daily., Disp: 90 tablet, Rfl: 3 .  Blood Glucose Monitoring Suppl (ACCU-CHEK NANO SMARTVIEW) w/Device KIT, 1 strip by Does not apply route daily., Disp: 1 kit, Rfl: 0 .  glucose blood test strip, 1 each by Other route as needed for other. Use as instructed, Disp: , Rfl:  .  lisinopril (PRINIVIL,ZESTRIL) 40 MG tablet, TAKE 1 TABLET (40 MG TOTAL) BY MOUTH DAILY., Disp: 90 tablet, Rfl: 3 .  metFORMIN (GLUCOPHAGE) 1000 MG tablet, TAKE 1 TABLET BY MOUTH IN THE EVENING, Disp: 90 tablet,  Rfl: 3 .  ONETOUCH DELICA LANCETS 00T MISC, 2 (two) times daily. for testing, Disp: , Rfl: 12  Review of Systems  Constitutional: Negative.   HENT: Negative.   Eyes: Negative.   Respiratory:  Negative.   Cardiovascular: Negative.   Gastrointestinal: Negative.   Neurological: Negative.   Psychiatric/Behavioral: Negative.     Social History   Tobacco Use  . Smoking status: Never Smoker  . Smokeless tobacco: Never Used  Substance Use Topics  . Alcohol use: Yes    Alcohol/week: 0.0 standard drinks    Comment: occasionally   Objective:   BP (!) 171/93   Pulse 84   Temp 98.5 F (36.9 C) (Oral)   Resp 16   Wt 247 lb (112 kg)   BMI 35.44 kg/m  Vitals:   07/01/18 1003  BP: (!) 171/93  Pulse: 84  Resp: 16  Temp: 98.5 F (36.9 C)  TempSrc: Oral  Weight: 247 lb (112 kg)     Physical Exam  Constitutional: He appears well-developed and well-nourished.  HENT:  Head: Normocephalic and atraumatic.  Right Ear: External ear normal.  Left Ear: External ear normal.  Nose: Nose normal.  Eyes: Conjunctivae are normal. No scleral icterus.  Neck: No thyromegaly present.  Cardiovascular: Normal rate, regular rhythm and normal heart sounds.  Pulmonary/Chest: Effort normal.  Abdominal: Soft.  Musculoskeletal: He exhibits no edema.  Neurological: He is alert.  Skin: Skin is warm and dry.  Psychiatric: He has a normal mood and affect. His behavior is normal. Judgment and thought content normal.        Assessment & Plan:     1. Diabetes mellitus type 2 in obese (HCC) Increase Metformin to 1000 mg BID. - POCT glycosylated hemoglobin (Hb A1C)--7.7  2. Hyperlipidemia associated with type 2 diabetes mellitus ([place])   3. Essential (primary) hypertension  I have done the exam and reviewed the chart and it is accurate to the best of my knowledge. Development worker, community has been used and  any errors in dictation or transcription are unintentional. Miguel Aschoff M.D. [place], MD  [facility]

## 2018-07-08 LAB — HM DIABETES EYE EXAM

## 2018-07-09 ENCOUNTER — Other Ambulatory Visit: Payer: Self-pay | Admitting: Family Medicine

## 2018-07-09 DIAGNOSIS — E669 Obesity, unspecified: Secondary | ICD-10-CM

## 2018-07-09 DIAGNOSIS — I10 Essential (primary) hypertension: Secondary | ICD-10-CM

## 2018-07-09 DIAGNOSIS — E1169 Type 2 diabetes mellitus with other specified complication: Secondary | ICD-10-CM

## 2018-10-04 ENCOUNTER — Ambulatory Visit: Payer: Self-pay | Admitting: Family Medicine

## 2018-10-11 ENCOUNTER — Encounter: Payer: Self-pay | Admitting: Family Medicine

## 2018-10-11 ENCOUNTER — Ambulatory Visit (INDEPENDENT_AMBULATORY_CARE_PROVIDER_SITE_OTHER): Payer: BLUE CROSS/BLUE SHIELD | Admitting: Family Medicine

## 2018-10-11 VITALS — BP 132/78 | HR 88 | Temp 98.9°F | Resp 16 | Ht 70.0 in | Wt 242.0 lb

## 2018-10-11 DIAGNOSIS — E1169 Type 2 diabetes mellitus with other specified complication: Secondary | ICD-10-CM

## 2018-10-11 DIAGNOSIS — I10 Essential (primary) hypertension: Secondary | ICD-10-CM | POA: Diagnosis not present

## 2018-10-11 DIAGNOSIS — E669 Obesity, unspecified: Secondary | ICD-10-CM | POA: Diagnosis not present

## 2018-10-11 DIAGNOSIS — E785 Hyperlipidemia, unspecified: Secondary | ICD-10-CM

## 2018-10-11 DIAGNOSIS — R0602 Shortness of breath: Secondary | ICD-10-CM | POA: Diagnosis not present

## 2018-10-11 LAB — POCT GLYCOSYLATED HEMOGLOBIN (HGB A1C)
Est. average glucose Bld gHb Est-mCnc: 169
Hemoglobin A1C: 7.5 % — AB (ref 4.0–5.6)

## 2018-10-11 NOTE — Patient Instructions (Signed)
Start Symbicort 80/4.5 2 puffs twice daily.

## 2018-10-11 NOTE — Progress Notes (Signed)
Patient: Reginald Murphy Male    DOB: [birth year]   63 y.o.   MRN: [id number] Visit Date: 10/11/2018  Today's Provider: [name], MD   Chief Complaint  Patient presents with  . Diabetes  . Hypertension   Subjective:     HPI   Diabetes Mellitus Type II, Follow-up:   Lab Results  Component Value Date   HGBA1C 7.5 (A) 10/11/2018   HGBA1C 7.7 (A) 07/01/2018   HGBA1C 7.7 (A) 02/23/2018    Last seen for diabetes 4 months ago.  Management since then includes no changes. He reports good compliance with treatment. He is not having side effects.  Current symptoms include none and have been stable. Home blood sugar records: fasting range: 150s  Episodes of hypoglycemia? no   Current Insulin Regimen: none Most Recent Eye Exam: 06/2018 Weight trend: stable Prior visit with dietician: no Current diet: well balanced Current exercise: no regular exercise, but he does stay active.   Pertinent Labs:    Component Value Date/Time   CHOL 185 04/01/2017 1033   TRIG 448 (H) 04/01/2017 1033   HDL 30 (L) 04/01/2017 1033   LDLCALC Comment 04/01/2017 1033   CREATININE 1.02 04/01/2017 1033    Wt Readings from Last 3 Encounters:  10/11/18 242 lb (109.8 kg)  07/01/18 247 lb (112 kg)  02/22/18 248 lb (112.5 kg)     Hypertension, follow-up:  BP Readings from Last 3 Encounters:  10/11/18 132/78  07/01/18 (!) 171/93  02/22/18 (!) 172/94    He was last seen for hypertension 4 months ago.  BP at that visit was 171/93. Management since that visit includes no changes. He reports good compliance with treatment. He is not having side effects.  He is not exercising. He is adherent to low salt diet.   Outside blood pressures are checked occasionally. He is experiencing none.  Patient denies exertional chest pressure/discomfort, lower extremity edema and palpitations.   Cardiovascular risk factors include diabetes mellitus and dyslipidemia. Had occasional chest  tightness recently.  This seems to occur mainly with cold weather and resolves with when he stops exerting himself.  No diaphoresis syncope or other symptoms.  Patient recently retired from work.  No Known Allergies   Current Outpatient Medications:  .  ACCU-CHEK GUIDE test strip, Please specify directions, refills and quantity, Disp: 100 each, Rfl: 11 .  amLODipine (NORVASC) 5 MG tablet, Take 1 tablet (5 mg total) by mouth daily., Disp: 90 tablet, Rfl: 3 .  Blood Glucose Monitoring Suppl (ACCU-CHEK NANO SMARTVIEW) w/Device KIT, 1 strip by Does not apply route daily., Disp: 1 kit, Rfl: 0 .  glucose blood test strip, 1 each by Other route as needed for other. Use as instructed, Disp: , Rfl:  .  lisinopril (PRINIVIL,ZESTRIL) 40 MG tablet, TAKE 1 TABLET (40 MG TOTAL) BY MOUTH DAILY., Disp: 90 tablet, Rfl: 3 .  metFORMIN (GLUCOPHAGE) 1000 MG tablet, Take 1 tablet (1,000 mg total) by mouth 2 (two) times daily with a meal., Disp: 90 tablet, Rfl: 3 .  metFORMIN (GLUCOPHAGE) 1000 MG tablet, TAKE 1 TABLET BY MOUTH IN THE EVENING, Disp: 90 tablet, Rfl: 3 .  ONETOUCH DELICA LANCETS 25K MISC, 2 (two) times daily. for testing, Disp: , Rfl: 12  Review of Systems  Constitutional: Negative for activity change, appetite change, chills, diaphoresis, fatigue, fever and unexpected weight change.  Eyes: Negative.   Respiratory: Positive for chest tightness. Negative for cough and shortness of breath.  Cardiovascular: Negative for chest pain, palpitations and leg swelling.  Gastrointestinal: Negative.   Endocrine: Negative for cold intolerance, heat intolerance, polydipsia, polyphagia and polyuria.  Musculoskeletal: Negative for arthralgias, joint swelling and myalgias.  Allergic/Immunologic: Negative for environmental allergies.  Neurological: Negative for dizziness, light-headedness and headaches.  Psychiatric/Behavioral: Negative.     Social History   Tobacco Use  . Smoking status: Never Smoker  .  Smokeless tobacco: Never Used  Substance Use Topics  . Alcohol use: Yes    Alcohol/week: 0.0 standard drinks    Comment: occasionally      Objective:   BP 132/78 (BP Location: Left Arm, Patient Position: Sitting, Cuff Size: Large)   Pulse 88   Temp 98.9 F (37.2 C)   Resp 16   Ht _0  (1.778 m)   Wt 242 lb (109.8 kg)   BMI 34.72 kg/m  Vitals:   10/11/18 1002  BP: 132/78  Pulse: 88  Resp: 16  Temp: 98.9 F (37.2 C)  Weight: 242 lb (109.8 kg)  Height: _1  (1.778 m)     Physical Exam Constitutional:      Appearance: He is well-developed.  HENT:     Head: Normocephalic and atraumatic.     Right Ear: External ear normal.     Left Ear: External ear normal.     Nose: Nose normal.  Eyes:     Conjunctiva/sclera: Conjunctivae normal.     Pupils: Pupils are equal, round, and reactive to light.  Neck:     Musculoskeletal: Normal range of motion and neck supple.  Cardiovascular:     Rate and Rhythm: Normal rate and regular rhythm.     Heart sounds: Normal heart sounds.  Pulmonary:     Effort: Pulmonary effort is normal.     Breath sounds: Normal breath sounds.  Abdominal:     General: Bowel sounds are normal.     Palpations: Abdomen is soft.  Genitourinary:    Penis: Normal.      Prostate: Normal.     Rectum: Normal.  Musculoskeletal: Normal range of motion.  Skin:    General: Skin is warm and dry.  Neurological:     Mental Status: He is alert and oriented to person, place, and time.     Deep Tendon Reflexes: Reflexes are normal and symmetric.  Psychiatric:        Behavior: Behavior normal.        Thought Content: Thought content normal.        Judgment: Judgment normal.   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 Diabetes mellitus type 2 in obese (HCC) Stable with an A1c of 7.5 today, it was 7.7 last time.  Encourage  patient to work hard on lifestyle as he is now retired. - POCT glycosylated hemoglobin (Hb A1C)  2. Essential (primary) hypertension  - EKG 12-Lead  3. Shortness of breath Possible mild asthma.  At this time start inhaler daily.  Return to clinic 2 to 3 weeks.  First will refer to cardiology to rule out any cardiovascular/heart disease.  Referral made to Dr. Ubaldo Glassing.  Patient is taking-aspirin daily.  He is having no rest pain and the tightness only occurs in cold weather. - Spirometry with graph; Future - Spirometry with graph  4. Hyperlipidemia associated with type 2 diabetes mellitus ([place]) We need  to start a statin.  I have done the exam and reviewed the above chart and it is accurate to the best of my knowledge. Development worker, community has been used in this note in any air is in the dictation or transcription are unintentional.  [name], MD  [place]

## 2018-10-27 ENCOUNTER — Other Ambulatory Visit: Payer: Self-pay

## 2018-10-27 DIAGNOSIS — J45909 Unspecified asthma, uncomplicated: Secondary | ICD-10-CM

## 2018-10-27 MED ORDER — ALBUTEROL SULFATE HFA 108 (90 BASE) MCG/ACT IN AERS: 2.0000 | INHALATION_SPRAY | Freq: Four times a day (QID) | RESPIRATORY_TRACT | 0 refills | Status: DC | PRN

## 2018-10-27 MED ORDER — ALBUTEROL SULFATE HFA 108 (90 BASE) MCG/ACT IN AERS: 2.0000 | INHALATION_SPRAY | Freq: Once | RESPIRATORY_TRACT | Status: DC

## 2018-11-08 ENCOUNTER — Ambulatory Visit (INDEPENDENT_AMBULATORY_CARE_PROVIDER_SITE_OTHER): Payer: BLUE CROSS/BLUE SHIELD | Admitting: Family Medicine

## 2018-11-08 VITALS — BP 152/70 | HR 74 | Temp 98.5°F | Resp 16 | Ht 70.0 in | Wt 241.0 lb

## 2018-11-08 DIAGNOSIS — E1169 Type 2 diabetes mellitus with other specified complication: Secondary | ICD-10-CM | POA: Diagnosis not present

## 2018-11-08 DIAGNOSIS — I10 Essential (primary) hypertension: Secondary | ICD-10-CM

## 2018-11-08 DIAGNOSIS — E785 Hyperlipidemia, unspecified: Secondary | ICD-10-CM

## 2018-11-08 DIAGNOSIS — R0602 Shortness of breath: Secondary | ICD-10-CM | POA: Diagnosis not present

## 2018-11-08 DIAGNOSIS — Z6834 Body mass index (BMI) 34.0-34.9, adult: Secondary | ICD-10-CM

## 2018-11-08 DIAGNOSIS — E6609 Other obesity due to excess calories: Secondary | ICD-10-CM | POA: Diagnosis not present

## 2018-11-08 MED ORDER — BUDESONIDE-FORMOTEROL FUMARATE 80-4.5 MCG/ACT IN AERO: 2.0000 | INHALATION_SPRAY | Freq: Two times a day (BID) | RESPIRATORY_TRACT | 3 refills | Status: DC

## 2018-11-08 NOTE — Progress Notes (Signed)
Patient: Reginald Murphy Male    DOB: [birth year]   63 y.o.   MRN: [id number] Visit Date: 11/08/2018  Today's Provider: [name], MD   Chief Complaint  Patient presents with  . Follow-up  . Shortness of Breath   Subjective:     HPI  Patient comes in today for a follow up. He was last seen in the office 1 month ago. He was started on Symbicort 80/4.5 2 puffs twice daily. Patient reports that this has helped a little with this breathing. Patient was also referred to Dr. Ubaldo Glassing for a stress test. He reports that he also has a f/u appt with them this afternoon.   No Known Allergies   Current Outpatient Medications:  .  ACCU-CHEK GUIDE test strip, Please specify directions, refills and quantity, Disp: 100 each, Rfl: 11 .  albuterol (PROVENTIL HFA;VENTOLIN HFA) 108 (90 Base) MCG/ACT inhaler, Inhale 2 puffs into the lungs every 6 (six) hours as needed for wheezing or shortness of breath., Disp: 1 Inhaler, Rfl: 0 .  amLODipine (NORVASC) 5 MG tablet, Take 1 tablet (5 mg total) by mouth daily., Disp: 90 tablet, Rfl: 3 .  Blood Glucose Monitoring Suppl (ACCU-CHEK NANO SMARTVIEW) w/Device KIT, 1 strip by Does not apply route daily., Disp: 1 kit, Rfl: 0 .  glucose blood test strip, 1 each by Other route as needed for other. Use as instructed, Disp: , Rfl:  .  lisinopril (PRINIVIL,ZESTRIL) 40 MG tablet, TAKE 1 TABLET (40 MG TOTAL) BY MOUTH DAILY., Disp: 90 tablet, Rfl: 3 .  metFORMIN (GLUCOPHAGE) 1000 MG tablet, Take 1 tablet (1,000 mg total) by mouth 2 (two) times daily with a meal., Disp: 90 tablet, Rfl: 3 .  metFORMIN (GLUCOPHAGE) 1000 MG tablet, TAKE 1 TABLET BY MOUTH IN THE EVENING, Disp: 90 tablet, Rfl: 3 .  ONETOUCH DELICA LANCETS 93Y MISC, 2 (two) times daily. for testing, Disp: , Rfl: 12  Review of Systems  Constitutional: Negative for activity change, appetite change and fatigue.  Respiratory: Negative for apnea, cough, chest tightness, shortness of breath and wheezing.     Cardiovascular: Negative for chest pain, palpitations and leg swelling.  Endocrine: Negative.   Musculoskeletal: Negative for back pain.  Allergic/Immunologic: Negative for environmental allergies.  Neurological: Negative for dizziness and headaches.  Psychiatric/Behavioral: Negative for agitation, self-injury, sleep disturbance and suicidal ideas. The patient is not nervous/anxious.     Social History   Tobacco Use  . Smoking status: Never Smoker  . Smokeless tobacco: Never Used  Substance Use Topics  . Alcohol use: Yes    Alcohol/week: 0.0 standard drinks    Comment: occasionally      Objective:   BP (!) 152/70 (BP Location: Right Arm, Patient Position: Sitting, Cuff Size: Normal)   Pulse 74   Temp 98.5 F (36.9 C)   Resp 16   Ht 5' 10"  (1.778 m)   Wt 241 lb (109.3 kg)   BMI 34.58 kg/m  Vitals:   11/08/18 0954  BP: (!) 152/70  Pulse: 74  Resp: 16  Temp: 98.5 F (36.9 C)  Weight: 241 lb (109.3 kg)  Height: 5' 10"  (1.778 m)     Physical Exam Vitals signs reviewed.  Constitutional:      Appearance: He is well-developed.  HENT:     Head: Normocephalic and atraumatic.     Right Ear: External ear normal.     Left Ear: External ear normal.     Nose: Nose  normal.  Eyes:     Conjunctiva/sclera: Conjunctivae normal.     Pupils: Pupils are equal, round, and reactive to light.  Neck:     Musculoskeletal: Normal range of motion and neck supple.  Cardiovascular:     Rate and Rhythm: Normal rate and regular rhythm.     Heart sounds: Normal heart sounds.  Pulmonary:     Effort: Pulmonary effort is normal.     Breath sounds: Normal breath sounds.  Abdominal:     General: Bowel sounds are normal.     Palpations: Abdomen is soft.  Genitourinary:    Penis: Normal.      Prostate: Normal.     Rectum: Normal.  Musculoskeletal: Normal range of motion.  Skin:    General: Skin is warm and dry.  Neurological:     General: No focal deficit present.     Mental Status:  He is alert and oriented to person, place, and time.     Deep Tendon Reflexes: Reflexes are normal and symmetric.  Psychiatric:        Behavior: Behavior normal.        Thought Content: Thought content normal.        Judgment: Judgment normal.         Assessment & Plan    1. Essential (primary) hypertension Fair control.  Follow-up 1 month.  2. Shortness of breath Improved on Symbicort.  Refill given.  He also has follow-up with Dr. Ubaldo Glassing tomorrow. - budesonide-formoterol (SYMBICORT) 80-4.5 MCG/ACT inhaler; Inhale 2 puffs into the lungs 2 (two) times daily.  Dispense: 1 Inhaler; Refill: 3  3. Hyperlipidemia associated with type 2 diabetes mellitus ([place]) Discussed with patient that we need to start him on a statin.  4. Class 1 obesity due to excess calories with serious comorbidity and body mass index (BMI) of 34.0 to 34.9 in adult With diabetes hypertension and hyperlipidemia.    I have done the exam and reviewed the above chart and it is accurate to the best of my knowledge. Development worker, community has been used in this note in any air is in the dictation or transcription are unintentional.  [name], MD  [place]

## 2018-11-14 ENCOUNTER — Other Ambulatory Visit: Payer: Self-pay | Admitting: Family Medicine

## 2018-11-14 DIAGNOSIS — J45909 Unspecified asthma, uncomplicated: Secondary | ICD-10-CM

## 2018-12-20 ENCOUNTER — Other Ambulatory Visit: Payer: Self-pay | Admitting: Family Medicine

## 2018-12-20 DIAGNOSIS — R0602 Shortness of breath: Secondary | ICD-10-CM

## 2018-12-29 ENCOUNTER — Other Ambulatory Visit: Payer: Self-pay | Admitting: Family Medicine

## 2019-01-21 ENCOUNTER — Telehealth: Payer: Self-pay | Admitting: Family Medicine

## 2019-01-21 NOTE — Telephone Encounter (Signed)
Patient called and says his daughter was exposed to her roommate who was around a family member who was tested positive for the coronavirus. He says his daughter is home now and is around him and her grandparents, so he's asking what does she need to do to be tested, then they will follow after her if it's positive. I advised she will need to contact her PCP or she can go on the [facility] website and do the covid assessment, then they will direct her what to do. I advised the testing sites requires a physician order, he verbalized understanding.

## 2019-03-02 ENCOUNTER — Ambulatory Visit: Payer: Self-pay | Admitting: Family Medicine

## 2019-03-03 ENCOUNTER — Encounter: Payer: Self-pay | Admitting: Family Medicine

## 2019-03-03 ENCOUNTER — Other Ambulatory Visit: Payer: Self-pay

## 2019-03-03 ENCOUNTER — Ambulatory Visit (INDEPENDENT_AMBULATORY_CARE_PROVIDER_SITE_OTHER): Payer: BLUE CROSS/BLUE SHIELD | Admitting: Family Medicine

## 2019-03-03 VITALS — BP 120/70 | HR 80 | Temp 98.4°F | Resp 16 | Wt 240.0 lb

## 2019-03-03 DIAGNOSIS — E1169 Type 2 diabetes mellitus with other specified complication: Secondary | ICD-10-CM | POA: Diagnosis not present

## 2019-03-03 DIAGNOSIS — Z125 Encounter for screening for malignant neoplasm of prostate: Secondary | ICD-10-CM

## 2019-03-03 DIAGNOSIS — E785 Hyperlipidemia, unspecified: Secondary | ICD-10-CM

## 2019-03-03 DIAGNOSIS — E119 Type 2 diabetes mellitus without complications: Secondary | ICD-10-CM

## 2019-03-03 DIAGNOSIS — I10 Essential (primary) hypertension: Secondary | ICD-10-CM | POA: Diagnosis not present

## 2019-03-03 LAB — POCT GLYCOSYLATED HEMOGLOBIN (HGB A1C): Hemoglobin A1C: 7 % — AB (ref 4.0–5.6)

## 2019-03-03 LAB — POCT UA - MICROALBUMIN: Microalbumin Ur, POC: 50 mg/L

## 2019-03-03 NOTE — Progress Notes (Signed)
Patient: Reginald Mahany Murphy Male    DOB: October 06, 1955   63 y.o.   MRN: [id number] Visit Date: 03/03/2019  Today's Provider: [name], MD   Chief Complaint  Patient presents with  . Diabetes  . Hypertension  . Hyperlipidemia   Subjective:     HPI  Diabetes Mellitus Type II, Follow-up:   Lab Results  Component Value Date   HGBA1C 7.5 (A) 10/11/2018   HGBA1C 7.7 (A) 07/01/2018   HGBA1C 7.7 (A) 02/23/2018   Last seen for diabetes 4 months ago.  Management since then includes . He reports excellent compliance with treatment. He is not having side effects.  Current symptoms include none and have been unchanged. Home blood sugar records: not being checked regularly  Episodes of hypoglycemia? no   Current Insulin Regimen: N/A Most Recent Eye Exam: Weight trend: fluctuating a bit Prior visit with dietician: no Current diet: well balanced Current exercise: walking  ------------------------------------------------------------------------   Hypertension, follow-up:  BP Readings from Last 3 Encounters:  03/03/19 120/70  11/08/18 (!) 152/70  10/11/18 132/78    He was last seen for hypertension 3 months ago.  BP at that visit was 152/70. Management since that visit includes none.He reports excellent compliance with treatment. He is not having side effects.  He is exercising. He is not adherent to low salt diet.   Outside blood pressures are not being check regularly. He is experiencing exertional chest pressure/discomfort.  Patient denies chest pain, chest pressure/discomfort, claudication, dyspnea, fatigue, irregular heart beat, lower extremity edema, near-syncope, orthopnea, palpitations, paroxysmal nocturnal dyspnea, syncope and tachypnea.   Cardiovascular risk factors include advanced age (older than 47 for men, 9 for women), diabetes mellitus, hypertension, male gender and obesity (BMI >= 30 kg/m2).  Use of agents associated with hypertension: NSAIDS.    ------------------------------------------------------------------------    Lipid/Cholesterol, Follow-up:   Last seen for this 3 months ago.  Management since that visit includes discussed starting patient on a statin drug.  Last Lipid Panel:    Component Value Date/Time   CHOL 185 04/01/2017 1033   TRIG 448 (H) 04/01/2017 1033   HDL 30 (L) 04/01/2017 1033   [place] Comment 04/01/2017 1033     Wt Readings from Last 3 Encounters:  03/03/19 240 lb (108.9 kg)  11/08/18 241 lb (109.3 kg)  10/11/18 242 lb (109.8 kg)    ------------------------------------------------------------------------  No Known Allergies   Current Outpatient Medications:  .  ACCU-CHEK GUIDE test strip, Please specify directions, refills and quantity, Disp: 100 each, Rfl: 11 .  amLODipine (NORVASC) 5 MG tablet, Take 1 tablet (5 mg total) by mouth daily., Disp: 90 tablet, Rfl: 3 .  aspirin EC 81 MG tablet, Take 81 mg by mouth daily., Disp: , Rfl:  .  Blood Glucose Monitoring Suppl (ACCU-CHEK NANO SMARTVIEW) w/Device KIT, 1 strip by Does not apply route daily., Disp: 1 kit, Rfl: 0 .  glucose blood test strip, 1 each by Other route as needed for other. Use as instructed, Disp: , Rfl:  .  lisinopril (PRINIVIL,ZESTRIL) 40 MG tablet, TAKE 1 TABLET (40 MG TOTAL) BY MOUTH DAILY., Disp: 90 tablet, Rfl: 3 .  metFORMIN (GLUCOPHAGE) 1000 MG tablet, TAKE 1 TABLET BY MOUTH 2 TIMES DAILY WITH A MEAL., Disp: 90 tablet, Rfl: 3 .  ONETOUCH DELICA LANCETS 22W MISC, 2 (two) times daily. for testing, Disp: , Rfl: 12 .  SYMBICORT 80-4.5 MCG/ACT inhaler, TAKE 2 PUFFS BY MOUTH TWICE A DAY, Disp: 30.6  Inhaler, Rfl: 1 .  VENTOLIN HFA 108 (90 Base) MCG/ACT inhaler, TAKE 2 PUFFS BY MOUTH EVERY 6 HOURS AS NEEDED FOR WHEEZE OR SHORTNESS OF BREATH, Disp: 18 Inhaler, Rfl: 0  Review of Systems  Constitutional: Negative.   HENT: Negative.   Respiratory: Negative.   Gastrointestinal: Negative.   Endocrine: Negative.   Genitourinary:  Negative.   Musculoskeletal: Negative.   Skin: Negative.   Neurological: Negative.     Social History   Tobacco Use  . Smoking status: Never Smoker  . Smokeless tobacco: Never Used  Substance Use Topics  . Alcohol use: Yes    Alcohol/week: 0.0 standard drinks    Comment: occasionally      Objective:   BP 120/70   Pulse 80   Temp 98.4 F (36.9 C) (Oral)   Resp 16   Wt 240 lb (108.9 kg)   SpO2 97%   BMI 34.44 kg/m  Vitals:   03/03/19 0940  BP: 120/70  Pulse: 80  Resp: 16  Temp: 98.4 F (36.9 C)  TempSrc: Oral  SpO2: 97%  Weight: 240 lb (108.9 kg)     Physical Exam Vitals signs reviewed.  Constitutional:      Appearance: He is well-developed.  HENT:     Head: Normocephalic and atraumatic.     Right Ear: External ear normal.     Left Ear: External ear normal.     Nose: Nose normal.  Eyes:     Conjunctiva/sclera: Conjunctivae normal.     Pupils: Pupils are equal, round, and reactive to light.  Neck:     Musculoskeletal: Normal range of motion and neck supple.  Cardiovascular:     Rate and Rhythm: Normal rate and regular rhythm.     Heart sounds: Normal heart sounds.  Pulmonary:     Effort: Pulmonary effort is normal.     Breath sounds: Normal breath sounds.  Abdominal:     General: Bowel sounds are normal.     Palpations: Abdomen is soft.  Genitourinary:    Penis: Normal.      Prostate: Normal.     Rectum: Normal.  Musculoskeletal: Normal range of motion.  Skin:    General: Skin is warm and dry.  Neurological:     Mental Status: He is alert and oriented to person, place, and time.     Deep Tendon Reflexes: Reflexes are normal and symmetric.  Psychiatric:        Behavior: Behavior normal.        Thought Content: Thought content normal.        Judgment: Judgment normal.      No results found for any visits on 03/03/19.     Assessment & Plan    1. Diabetes mellitus without complication ([place])  - POCT glycosylated hemoglobin (Hb A1C)--7.0  today--down from 7.5 - CBC with Differential/Platelet - POCT UA - Microalbumin  2. Essential (primary) hypertension On lisinopril,amlodipine. - Comprehensive metabolic panel - TSH  3. Hyperlipidemia associated with type 2 diabetes mellitus ([place])  - Lipid Panel With LDL/HDL Ratio  4. Screening for prostate cancer  - PSA     [name], MD  [facility] Fritzi Mandes [place] as a scribe for [name], MD.,have documented all relevant documentation on the behalf of [name], MD,as directed by  [name], MD while in the presence of [name], MD.

## 2019-03-04 ENCOUNTER — Telehealth: Payer: Self-pay

## 2019-03-04 LAB — CBC WITH DIFFERENTIAL/PLATELET
Basophils Absolute: 0 10*3/uL (ref 0.0–0.2)
Basos: 0 %
EOS (ABSOLUTE): 0.3 10*3/uL (ref 0.0–0.4)
Eos: 5 %
Hematocrit: 42.7 % (ref 37.5–51.0)
Hemoglobin: 14.3 g/dL (ref 13.0–17.7)
Immature Grans (Abs): 0 10*3/uL (ref 0.0–0.1)
Immature Granulocytes: 0 %
Lymphocytes Absolute: 2.5 10*3/uL (ref 0.7–3.1)
Lymphs: 36 %
MCH: 29.2 pg (ref 26.6–33.0)
MCHC: 33.5 g/dL (ref 31.5–35.7)
MCV: 87 fL (ref 79–97)
Monocytes Absolute: 0.6 10*3/uL (ref 0.1–0.9)
Monocytes: 9 %
Neutrophils Absolute: 3.5 10*3/uL (ref 1.4–7.0)
Neutrophils: 50 %
Platelets: 201 10*3/uL (ref 150–450)
RBC: 4.89 x10E6/uL (ref 4.14–5.80)
RDW: 12.7 % (ref 11.6–15.4)
WBC: 7 10*3/uL (ref 3.4–10.8)

## 2019-03-04 LAB — COMPREHENSIVE METABOLIC PANEL
ALT: 26 IU/L (ref 0–44)
AST: 19 IU/L (ref 0–40)
Albumin/Globulin Ratio: 1.7 (ref 1.2–2.2)
Albumin: 4.7 g/dL (ref 3.8–4.9)
Alkaline Phosphatase: 65 IU/L (ref 39–117)
BUN/Creatinine Ratio: 19 (ref 10–24)
BUN: 22 mg/dL (ref 8–27)
Bilirubin Total: 0.6 mg/dL (ref 0.0–1.2)
CO2: 21 mmol/L (ref 20–29)
Calcium: 9.6 mg/dL (ref 8.6–10.2)
Chloride: 101 mmol/L (ref 96–106)
Creatinine, Ser: 1.17 mg/dL (ref 0.76–1.27)
GFR calc Af Amer: 78 mL/min/{1.73_m2} (ref >59)
GFR calc non Af Amer: 67 mL/min/{1.73_m2} (ref >59)
Globulin, Total: 2.7 g/dL (ref 1.5–4.5)
Glucose: 116 mg/dL — ABNORMAL HIGH (ref 65–99)
Potassium: 4.6 mmol/L (ref 3.5–5.2)
Sodium: 140 mmol/L (ref 134–144)
Total Protein: 7.4 g/dL (ref 6.0–8.5)

## 2019-03-04 LAB — PSA: Prostate Specific Ag, Serum: 0.4 ng/mL (ref 0.0–4.0)

## 2019-03-04 LAB — LIPID PANEL WITH LDL/HDL RATIO
Cholesterol, Total: 176 mg/dL (ref 100–199)
HDL: 30 mg/dL — ABNORMAL LOW (ref >39)
LDL Calculated: 80 mg/dL (ref 0–99)
LDl/HDL Ratio: 2.7 ratio (ref 0.0–3.6)
Triglycerides: 329 mg/dL — ABNORMAL HIGH (ref 0–149)
VLDL Cholesterol Cal: 66 mg/dL — ABNORMAL HIGH (ref 5–40)

## 2019-03-04 LAB — TSH: TSH: 2.4 u[IU]/mL (ref 0.450–4.500)

## 2019-03-04 NOTE — Telephone Encounter (Signed)
Tried calling; pt's Voicemail is not set up.   Thanks,   -Mickel Baas

## 2019-03-04 NOTE — Telephone Encounter (Signed)
-----   Message from Jerrol Banana., MD sent at 03/04/2019 11:48 AM EDT ----- Lipids a little better.

## 2019-04-15 ENCOUNTER — Other Ambulatory Visit: Payer: Self-pay | Admitting: Family Medicine

## 2019-04-15 DIAGNOSIS — I10 Essential (primary) hypertension: Secondary | ICD-10-CM

## 2019-07-03 ENCOUNTER — Other Ambulatory Visit: Payer: Self-pay | Admitting: Family Medicine

## 2019-07-03 DIAGNOSIS — J45909 Unspecified asthma, uncomplicated: Secondary | ICD-10-CM

## 2019-07-06 ENCOUNTER — Ambulatory Visit: Payer: Self-pay | Admitting: Family Medicine

## 2019-07-18 ENCOUNTER — Other Ambulatory Visit: Payer: Self-pay

## 2019-07-18 ENCOUNTER — Ambulatory Visit (INDEPENDENT_AMBULATORY_CARE_PROVIDER_SITE_OTHER): Payer: BLUE CROSS/BLUE SHIELD | Admitting: Family Medicine

## 2019-07-18 ENCOUNTER — Encounter: Payer: Self-pay | Admitting: Family Medicine

## 2019-07-18 VITALS — BP 122/76 | HR 87 | Temp 97.1°F | Resp 16 | Ht 70.0 in | Wt 238.0 lb

## 2019-07-18 DIAGNOSIS — H539 Unspecified visual disturbance: Secondary | ICD-10-CM

## 2019-07-18 DIAGNOSIS — E119 Type 2 diabetes mellitus without complications: Secondary | ICD-10-CM | POA: Diagnosis not present

## 2019-07-18 DIAGNOSIS — E663 Overweight: Secondary | ICD-10-CM | POA: Diagnosis not present

## 2019-07-18 DIAGNOSIS — I10 Essential (primary) hypertension: Secondary | ICD-10-CM

## 2019-07-18 LAB — GLUCOSE, POCT (MANUAL RESULT ENTRY): POC Glucose: 117 mg/dl — AB (ref 70–99)

## 2019-07-18 LAB — POCT GLYCOSYLATED HEMOGLOBIN (HGB A1C)
Est. average glucose Bld gHb Est-mCnc: 154
Hemoglobin A1C: 7 % — AB (ref 4.0–5.6)

## 2019-07-18 NOTE — Progress Notes (Signed)
Patient: Reginald Murphy Male    DOB: 04-Sep-1956   63 y.o.   MRN: [id number] Visit Date: 07/18/2019  Today's Provider: [name], MD   Chief Complaint  Patient presents with  . Diabetes  . Follow-up  . Slight Vision Changes    Brightness interrupts vision   Subjective:    Diabetes mellitus without complication ([place]) From 04/19/7516 - POCT glycosylated hemoglobin (Hb A1C)--7.0 today--down from 7.5 - CBC with Differential/Platelet - POCT UA - Microalbumin 07/18/2019 Fasting level today was 140.  Essential (primary) hypertension From 03/03/2019 On lisinopril,amlodipine. Comprehensive metabolic panel TSH  Vision Changes:  07/18/2019  Uncorrected  L: 20/20  R:20/20  B:20/20  Patient's complaint today is 1 of mild blurry vision that he has noticed over recent weeks.  He thinks it is related to his diabetes.   No Known Allergies   Current Outpatient Medications:  .  ACCU-CHEK GUIDE test strip, Please specify directions, refills and quantity, Disp: 100 each, Rfl: 11 .  amLODipine (NORVASC) 5 MG tablet, TAKE 1 TABLET BY MOUTH EVERY DAY, Disp: 90 tablet, Rfl: 3 .  aspirin EC 81 MG tablet, Take 81 mg by mouth daily., Disp: , Rfl:  .  Blood Glucose Monitoring Suppl (ACCU-CHEK NANO SMARTVIEW) w/Device KIT, 1 strip by Does not apply route daily., Disp: 1 kit, Rfl: 0 .  glucose blood test strip, 1 each by Other route as needed for other. Use as instructed, Disp: , Rfl:  .  lisinopril (PRINIVIL,ZESTRIL) 40 MG tablet, TAKE 1 TABLET (40 MG TOTAL) BY MOUTH DAILY., Disp: 90 tablet, Rfl: 3 .  metFORMIN (GLUCOPHAGE) 1000 MG tablet, TAKE 1 TABLET BY MOUTH 2 TIMES DAILY WITH A MEAL., Disp: 90 tablet, Rfl: 3 .  ONETOUCH DELICA LANCETS 00F MISC, 2 (two) times daily. for testing, Disp: , Rfl: 12 .  SYMBICORT 80-4.5 MCG/ACT inhaler, TAKE 2 PUFFS BY MOUTH TWICE A DAY, Disp: 30.6 Inhaler, Rfl: 1 .  VENTOLIN HFA 108 (90 Base) MCG/ACT inhaler, TAKE 2 PUFFS BY MOUTH EVERY 6 HOURS AS  NEEDED FOR WHEEZE OR SHORTNESS OF BREATH, Disp: 16 g, Rfl: 3  Review of Systems  Constitutional: Negative.   HENT: Negative.   Eyes: Positive for visual disturbance.  Respiratory: Negative.   Cardiovascular: Negative.   Endocrine: Negative.   Allergic/Immunologic: Negative.   Neurological: Negative.   Hematological: Negative.   Psychiatric/Behavioral: Negative.     Social History   Tobacco Use  . Smoking status: Never Smoker  . Smokeless tobacco: Never Used  Substance Use Topics  . Alcohol use: Yes    Alcohol/week: 0.0 standard drinks    Comment: occasionally      Objective:   BP 122/76   Pulse 87   Temp (!) 97.1 F (36.2 C) (Temporal)   Resp 16   Ht 5' 10"  (1.778 m)   Wt 238 lb (108 kg)   SpO2 97%   BMI 34.15 kg/m  Vitals:   07/18/19 1412  BP: 122/76  Pulse: 87  Resp: 16  Temp: (!) 97.1 F (36.2 C)  TempSrc: Temporal  SpO2: 97%  Weight: 238 lb (108 kg)  Height: 5' 10"  (1.778 m)  Body mass index is 34.15 kg/m.   Physical Exam Vitals signs reviewed.  Constitutional:      Appearance: He is well-developed.  HENT:     Head: Normocephalic and atraumatic.     Right Ear: External ear normal.     Left Ear: External ear normal.  Nose: Nose normal.  Eyes:     Extraocular Movements: Extraocular movements intact.     Conjunctiva/sclera: Conjunctivae normal.     Pupils: Pupils are equal, round, and reactive to light.     Comments: Vision is 20/25 uncorrected and 20/20 in each eye checked.  Visual fields are intact.  Neck:     Musculoskeletal: Normal range of motion and neck supple.  Cardiovascular:     Rate and Rhythm: Normal rate and regular rhythm.     Heart sounds: Normal heart sounds.  Pulmonary:     Effort: Pulmonary effort is normal.     Breath sounds: Normal breath sounds.  Abdominal:     General: Bowel sounds are normal.     Palpations: Abdomen is soft.  Genitourinary:    Penis: Normal.      Prostate: Normal.     Rectum: Normal.   Musculoskeletal: Normal range of motion.  Skin:    General: Skin is warm and dry.  Neurological:     General: No focal deficit present.     Mental Status: He is alert and oriented to person, place, and time.     Deep Tendon Reflexes: Reflexes are normal and symmetric.  Psychiatric:        Mood and Affect: Mood normal.        Behavior: Behavior normal.        Thought Content: Thought content normal.        Judgment: Judgment normal.      Results for orders placed or performed in visit on 07/18/19  POCT HgB A1C  Result Value Ref Range   Hemoglobin A1C 7.0 (A) 4.0 - 5.6 %   Est. average glucose Bld gHb Est-mCnc 154   POCT Glucose (CBG)  Result Value Ref Range   POC Glucose 117 (A) 70 - 99 mg/dl       Assessment & Plan       1. Diabetes mellitus without complication ([facility]) I0X today is 7.0.  Random glucose is 137.  On next visit with patient will discuss starting statin although LDL is low at 80, this is still indicated. - POCT HgB A1C - POCT Glucose (CBG)  2. Vision changes Refer to ophthalmology.  He needs further eye exam. - Ambulatory referral to Ophthalmology  3. Essential (primary) hypertension Controlled on amlodipine and lisinopril  4. Excess weight Lifestyle discussed again.    Richard Cranford Mon, MD  [facility]

## 2019-07-20 LAB — HM DIABETES EYE EXAM

## 2019-07-21 ENCOUNTER — Encounter: Payer: Self-pay | Admitting: *Deleted

## 2019-07-28 ENCOUNTER — Other Ambulatory Visit: Payer: Self-pay

## 2019-07-28 DIAGNOSIS — Z20822 Contact with and (suspected) exposure to covid-19: Secondary | ICD-10-CM

## 2019-07-31 LAB — NOVEL CORONAVIRUS, NAA: SARS-CoV-2, NAA: NOT DETECTED

## 2019-08-02 ENCOUNTER — Telehealth: Payer: Self-pay | Admitting: Hematology

## 2019-08-02 NOTE — Telephone Encounter (Signed)
Pt is aware covid 19 test is ng on 08-02-2019

## 2019-08-11 ENCOUNTER — Other Ambulatory Visit: Payer: Self-pay | Admitting: Family Medicine

## 2019-08-11 DIAGNOSIS — E669 Obesity, unspecified: Secondary | ICD-10-CM

## 2019-08-11 DIAGNOSIS — E1169 Type 2 diabetes mellitus with other specified complication: Secondary | ICD-10-CM

## 2019-08-11 DIAGNOSIS — I10 Essential (primary) hypertension: Secondary | ICD-10-CM

## 2019-08-15 ENCOUNTER — Ambulatory Visit: Payer: Self-pay | Admitting: Family Medicine

## 2019-11-14 ENCOUNTER — Other Ambulatory Visit: Payer: Self-pay

## 2019-11-14 ENCOUNTER — Ambulatory Visit (INDEPENDENT_AMBULATORY_CARE_PROVIDER_SITE_OTHER): Payer: BLUE CROSS/BLUE SHIELD | Admitting: Family Medicine

## 2019-11-14 ENCOUNTER — Encounter: Payer: Self-pay | Admitting: Family Medicine

## 2019-11-14 VITALS — BP 127/77 | HR 93 | Temp 96.8°F | Resp 16 | Ht 70.0 in | Wt 242.0 lb

## 2019-11-14 DIAGNOSIS — N529 Male erectile dysfunction, unspecified: Secondary | ICD-10-CM | POA: Diagnosis not present

## 2019-11-14 DIAGNOSIS — E119 Type 2 diabetes mellitus without complications: Secondary | ICD-10-CM

## 2019-11-14 DIAGNOSIS — I10 Essential (primary) hypertension: Secondary | ICD-10-CM | POA: Diagnosis not present

## 2019-11-14 LAB — POCT GLYCOSYLATED HEMOGLOBIN (HGB A1C)
Est. average glucose Bld gHb Est-mCnc: 163
Hemoglobin A1C: 7.3 % — AB (ref 4.0–5.6)

## 2019-11-14 MED ORDER — TADALAFIL 20 MG PO TABS: 20.0000 mg | ORAL_TABLET | ORAL | 5 refills | Status: DC | PRN

## 2019-11-14 NOTE — Progress Notes (Signed)
Patient: Reginald Murphy Male    DOB: 1956-01-23   64 y.o.   MRN: [id number] Visit Date: 11/14/2019  Today's Provider: [name], MD   Chief Complaint  Patient presents with  . Follow-up  . Diabetes  . Hypertension   Subjective:     HPI  Patient is not following his diet as well as he could.  He eats a lot of ice cream he says. He does complain of some ED.  This is a new problem in the past year or 2.  No cardiovascular symptoms.  Specifically no angina chest pain . Diabetes mellitus without complication ([place]) From 24/58/0998-P3A today is 7.0.  Random glucose is 137.  On next visit with patient will discuss starting statin although LDL is low at 80, this is still indicated.  Vision changes From 07/18/2019-Referred to ophthalmology.  He needs further eye exam.  Essential (primary) hypertension From 07/18/2019-Controlled on amlodipine and lisinopril.  Excess weight From 07/18/2019-Lifestyle discussed again.   No Known Allergies   Current Outpatient Medications:  .  amLODipine (NORVASC) 5 MG tablet, TAKE 1 TABLET BY MOUTH EVERY DAY, Disp: 90 tablet, Rfl: 3 .  aspirin EC 81 MG tablet, Take 81 mg by mouth daily., Disp: , Rfl:  .  Blood Glucose Monitoring Suppl (ACCU-CHEK NANO SMARTVIEW) w/Device KIT, 1 strip by Does not apply route daily., Disp: 1 kit, Rfl: 0 .  glucose blood test strip, 1 each by Other route as needed for other. Use as instructed, Disp: , Rfl:  .  lisinopril (ZESTRIL) 40 MG tablet, TAKE 1 TABLET (40 MG TOTAL) BY MOUTH DAILY., Disp: 90 tablet, Rfl: 3 .  metFORMIN (GLUCOPHAGE) 1000 MG tablet, TAKE 1 TABLET BY MOUTH 2 TIMES DAILY WITH A MEAL., Disp: 180 tablet, Rfl: 1 .  ONETOUCH DELICA LANCETS 25K MISC, 2 (two) times daily. for testing, Disp: , Rfl: 12 .  SYMBICORT 80-4.5 MCG/ACT inhaler, TAKE 2 PUFFS BY MOUTH TWICE A DAY, Disp: 30.6 Inhaler, Rfl: 1 .  ACCU-CHEK GUIDE test strip, Please specify directions, refills and quantity, Disp: 100 each,  Rfl: 11 .  VENTOLIN HFA 108 (90 Base) MCG/ACT inhaler, TAKE 2 PUFFS BY MOUTH EVERY 6 HOURS AS NEEDED FOR WHEEZE OR SHORTNESS OF BREATH, Disp: 16 g, Rfl: 3  Review of Systems  Constitutional: Negative for appetite change, chills and fever.  Eyes: Negative.        Vision now back to normal.  Respiratory: Negative for chest tightness, shortness of breath and wheezing.   Cardiovascular: Negative for chest pain and palpitations.  Gastrointestinal: Negative for abdominal pain, nausea and vomiting.  Endocrine: Negative.   Genitourinary:       ED,mild.  Allergic/Immunologic: Negative.   Neurological: Negative.   Hematological: Negative.   Psychiatric/Behavioral: Negative.     Social History   Tobacco Use  . Smoking status: Never Smoker  . Smokeless tobacco: Never Used  Substance Use Topics  . Alcohol use: Yes    Alcohol/week: 0.0 standard drinks    Comment: occasionally      Objective:   BP 127/77 (BP Location: Right Arm, Patient Position: Sitting, Cuff Size: Large)   Pulse 93   Temp (!) 96.8 F (36 C) (Other (Comment))   Resp 16   Ht 5' 10"  (1.778 m)   Wt 242 lb (109.8 kg)   SpO2 97%   BMI 34.72 kg/m  Vitals:   11/14/19 1425  BP: 127/77  Pulse: 93  Resp: 16  Temp: (!) 96.8 F (36 C)  TempSrc: Other (Comment)  SpO2: 97%  Weight: 242 lb (109.8 kg)  Height: 5' 10"  (1.778 m)  Body mass index is 34.72 kg/m.   Physical Exam Vitals reviewed.  Constitutional:      Appearance: He is well-developed.  HENT:     Head: Normocephalic and atraumatic.     Right Ear: External ear normal.     Left Ear: External ear normal.     Nose: Nose normal.  Eyes:     Conjunctiva/sclera: Conjunctivae normal.     Pupils: Pupils are equal, round, and reactive to light.  Cardiovascular:     Rate and Rhythm: Normal rate and regular rhythm.     Heart sounds: Normal heart sounds.  Pulmonary:     Effort: Pulmonary effort is normal.     Breath sounds: Normal breath sounds.  Abdominal:      General: Bowel sounds are normal.     Palpations: Abdomen is soft.  Genitourinary:    Penis: Normal.      Prostate: Normal.     Rectum: Normal.  Musculoskeletal:        General: Normal range of motion.     Cervical back: Normal range of motion and neck supple.  Skin:    General: Skin is warm and dry.  Neurological:     Mental Status: He is alert and oriented to person, place, and time.     Deep Tendon Reflexes: Reflexes are normal and symmetric.  Psychiatric:        Behavior: Behavior normal.        Thought Content: Thought content normal.        Judgment: Judgment normal.      No results found for any visits on 11/14/19.     Assessment & Plan    1. Diabetes mellitus without complication ([place]) Fair control with A1c of 7.3.  Continue Metformin. - POCT glycosylated hemoglobin (Hb A1C) 7.3 today.  2. Essential (primary) hypertension Good control with amlodipine and lisinopril.  3. Erectile dysfunction, unspecified erectile dysfunction type Try tadalafil 20 mg every three days prn.  - tadalafil (CIALIS) 20 MG tablet; Take 1 tablet (20 mg total) by mouth every three (3) days as needed for erectile dysfunction.  Dispense: 30 tablet; Refill: 5 4.HLD On next visit we will discuss low-dose Crestor for patient with LDL of 80 and diabetes.   Follow up in July for CPE.     I,Haylei Cobin,acting as a scribe for [name], MD.,have documented all relevant documentation on the behalf of [name], MD,as directed by  [name], MD while in the presence of [name], MD.     [name], MD  [facility]

## 2019-12-19 ENCOUNTER — Other Ambulatory Visit: Payer: Self-pay | Admitting: Family Medicine

## 2019-12-19 DIAGNOSIS — J45909 Unspecified asthma, uncomplicated: Secondary | ICD-10-CM

## 2019-12-19 NOTE — Telephone Encounter (Signed)
Requested medication (s) are due for refill today: no  Requested medication (s) are on the active medication list: yes  Last refill:  11/15/2019  Future visit scheduled: yes  Notes to clinic:  One inhaler should last at least one month. If the patient is requesting refills earlier, contact the patient to check for uncontrolled symptoms   Requested Prescriptions  Pending Prescriptions Disp Refills   albuterol (VENTOLIN HFA) 108 (90 Base) MCG/ACT inhaler [Pharmacy Med Name: ALBUTEROL HFA (PROAIR) INHALER]  3    Sig: TAKE 2 PUFFS BY MOUTH EVERY 6 HOURS AS NEEDED FOR WHEEZE OR SHORTNESS OF BREATH      Pulmonology:  Beta Agonists Failed - 12/19/2019  1:35 AM      Failed - One inhaler should last at least one month. If the patient is requesting refills earlier, contact the patient to check for uncontrolled symptoms.      Passed - Valid encounter within last 12 months    Recent Outpatient Visits           1 month ago Diabetes mellitus without complication [facility])   [facility] Jerrol Banana., MD   5 months ago Diabetes mellitus without complication [facility])   [facility]   9 months ago Diabetes mellitus without complication [facility])   [facility] Jerrol Banana., MD   1 year ago Essential (primary) hypertension   [facility] Jerrol Banana., MD   1 year ago Diabetes mellitus type 2 in obese [facility])   [facility] Jerrol Banana., MD

## 2020-03-06 ENCOUNTER — Ambulatory Visit: Payer: Self-pay

## 2020-03-06 NOTE — Telephone Encounter (Signed)
Patient's wife called and says about 10 minutes ago he was changing the time on an antique clock on the wall, the screw gave way and the clock fell. He tried to catch the clock and in the process it gashed his wrist. She says blood was all over the floor, the wall, the table. She says they run cold water on it and applied pressure with paper towels, then she called the office. She says the blood is not shooting out with the heart beat, but it's oozing out, not as heavy as it was initially, but still bleeding when pressure is not held. I asked her to use a cloth instead of the paper towel. She says the laceration is on the wrist area and it's curved, so unable to determine the length. She says it's deep about 1-2 inches, but he says it's skin deep. She asked if the office could stitch it up or go to the UC. I advised since it's still bleeding after 10 minutes of holding pressure, protocol says go to the ED. She asked would the UC be able to stitch it. I advised that it may be too much for them to handle in the UC, but she could call and ask and follow their advice, but my recommendation is the ED. She verbalized understanding and says she will check with UC first.  Reason for Disposition . [1] Bleeding AND [2] won't stop after 10 minutes of direct pressure (using correct technique)  Answer Assessment - Initial Assessment Questions 1. APPEARANCE of INJURY: "What does the injury look like?"      Laceration  2. SIZE: "How large is the cut?"      Curved  3. BLEEDING: "Is it bleeding now?" If Yes, ask: "Is it difficult to stop?"      Yes, hard to stop 4. LOCATION: "Where is the injury located?"      On the wrist 5. ONSET: "How long ago did the injury occur?"      At least 10 minutes ago 6. MECHANISM: "Tell me how it happened."      An antique clock fell on it 7. TETANUS: "When was the last tetanus booster?"     I don't know 8. PREGNANCY: "Is there any chance you are pregnant?" "When was your last  menstrual period?"     N/A  Protocols used: Green

## 2020-03-23 ENCOUNTER — Ambulatory Visit: Payer: Self-pay | Admitting: Podiatry

## 2020-03-23 ENCOUNTER — Other Ambulatory Visit
Admission: RE | Admit: 2020-03-23 | Discharge: 2020-03-23 | Disposition: A | Discharge status: Home / Self Care | Payer: 59 | Source: Ambulatory Visit | Attending: Cardiology | Admitting: Cardiology

## 2020-03-23 ENCOUNTER — Other Ambulatory Visit: Payer: Self-pay

## 2020-03-23 DIAGNOSIS — Z20822 Contact with and (suspected) exposure to covid-19: Secondary | ICD-10-CM | POA: Diagnosis not present

## 2020-03-23 DIAGNOSIS — Z01812 Encounter for preprocedural laboratory examination: Secondary | ICD-10-CM | POA: Diagnosis present

## 2020-03-23 LAB — SARS CORONAVIRUS 2 (TAT 6-24 HRS): SARS Coronavirus 2: NEGATIVE

## 2020-03-26 ENCOUNTER — Other Ambulatory Visit: Payer: Self-pay | Admitting: Cardiology

## 2020-03-26 MED ORDER — SODIUM CHLORIDE 0.9% FLUSH: 3.0000 mL | Freq: Two times a day (BID) | INTRAVENOUS | Status: DC

## 2020-03-27 ENCOUNTER — Encounter
Admission: RE | Disposition: A | Discharge status: Home / Self Care | Payer: Self-pay | Source: Home / Self Care | Attending: Cardiology

## 2020-03-27 ENCOUNTER — Other Ambulatory Visit: Payer: Self-pay

## 2020-03-27 ENCOUNTER — Other Ambulatory Visit: Payer: Self-pay | Admitting: Family Medicine

## 2020-03-27 ENCOUNTER — Encounter: Payer: Self-pay | Admitting: Cardiology

## 2020-03-27 ENCOUNTER — Ambulatory Visit
Admission: RE | Admit: 2020-03-27 | Discharge: 2020-03-27 | Disposition: A | Discharge status: Home / Self Care | Payer: 59 | Attending: Cardiology | Admitting: Cardiology

## 2020-03-27 DIAGNOSIS — Z7984 Long term (current) use of oral hypoglycemic drugs: Secondary | ICD-10-CM | POA: Diagnosis not present

## 2020-03-27 DIAGNOSIS — I25119 Atherosclerotic heart disease of native coronary artery with unspecified angina pectoris: Secondary | ICD-10-CM | POA: Insufficient documentation

## 2020-03-27 DIAGNOSIS — I10 Essential (primary) hypertension: Secondary | ICD-10-CM

## 2020-03-27 DIAGNOSIS — R9439 Abnormal result of other cardiovascular function study: Secondary | ICD-10-CM

## 2020-03-27 DIAGNOSIS — Z7951 Long term (current) use of inhaled steroids: Secondary | ICD-10-CM | POA: Insufficient documentation

## 2020-03-27 DIAGNOSIS — Z79899 Other long term (current) drug therapy: Secondary | ICD-10-CM | POA: Insufficient documentation

## 2020-03-27 DIAGNOSIS — R079 Chest pain, unspecified: Secondary | ICD-10-CM | POA: Diagnosis present

## 2020-03-27 DIAGNOSIS — E119 Type 2 diabetes mellitus without complications: Secondary | ICD-10-CM | POA: Diagnosis not present

## 2020-03-27 HISTORY — PX: LEFT HEART CATH AND CORONARY ANGIOGRAPHY: CATH118249

## 2020-03-27 LAB — GLUCOSE, CAPILLARY
Glucose-Capillary: 162 mg/dL — ABNORMAL HIGH (ref 70–99)
Glucose-Capillary: 145 mg/dL — ABNORMAL HIGH (ref 70–99)

## 2020-03-27 SURGERY — LEFT HEART CATH AND CORONARY ANGIOGRAPHY
Anesthesia: Moderate Sedation | Laterality: Left

## 2020-03-27 MED ORDER — SODIUM CHLORIDE 0.9 % WEIGHT BASED INFUSION: 1.0000 mL/kg/h | INTRAVENOUS | Status: DC

## 2020-03-27 MED ORDER — ONDANSETRON HCL 4 MG/2ML IJ SOLN: 4.0000 mg | Freq: Four times a day (QID) | INTRAMUSCULAR | Status: DC | PRN

## 2020-03-27 MED ORDER — HYDRALAZINE HCL 20 MG/ML IJ SOLN: 10.0000 mg | INTRAMUSCULAR | Status: DC | PRN

## 2020-03-27 MED ORDER — SODIUM CHLORIDE 0.9 % IV SOLN: 250.0000 mL | INTRAVENOUS | Status: DC | PRN

## 2020-03-27 MED ORDER — ASPIRIN 81 MG PO CHEW
CHEWABLE_TABLET | ORAL | Status: AC
  Filled 2020-03-27: qty 1

## 2020-03-27 MED ORDER — SODIUM CHLORIDE 0.9% FLUSH: 3.0000 mL | INTRAVENOUS | Status: DC | PRN

## 2020-03-27 MED ORDER — ASPIRIN 81 MG PO CHEW
81.0000 mg | CHEWABLE_TABLET | ORAL | Status: AC
  Administered 2020-03-27: 81 mg via ORAL

## 2020-03-27 MED ORDER — SODIUM CHLORIDE 0.9 % WEIGHT BASED INFUSION
3.0000 mL/kg/h | INTRAVENOUS | Status: AC
  Administered 2020-03-27: 3 mL/kg/h via INTRAVENOUS

## 2020-03-27 MED ORDER — IOHEXOL 300 MG/ML  SOLN
INTRAMUSCULAR | Status: DC | PRN
  Administered 2020-03-27: 70 mL

## 2020-03-27 MED ORDER — NITROGLYCERIN 0.4 MG SL SUBL
SUBLINGUAL_TABLET | SUBLINGUAL | Status: DC | PRN
  Administered 2020-03-27: .4 mg via SUBLINGUAL

## 2020-03-27 MED ORDER — LABETALOL HCL 5 MG/ML IV SOLN: 10.0000 mg | INTRAVENOUS | Status: DC | PRN

## 2020-03-27 MED ORDER — LIDOCAINE HCL (PF) 1 % IJ SOLN
INTRAMUSCULAR | Status: AC
  Filled 2020-03-27: qty 30

## 2020-03-27 MED ORDER — ACETAMINOPHEN 325 MG PO TABS: 650.0000 mg | ORAL_TABLET | ORAL | Status: DC | PRN

## 2020-03-27 MED ORDER — FENTANYL CITRATE (PF) 100 MCG/2ML IJ SOLN
INTRAMUSCULAR | Status: DC | PRN
  Administered 2020-03-27: 25 ug via INTRAVENOUS

## 2020-03-27 MED ORDER — NITROGLYCERIN 0.4 MG SL SUBL
SUBLINGUAL_TABLET | SUBLINGUAL | Status: AC
  Filled 2020-03-27: qty 1

## 2020-03-27 MED ORDER — MIDAZOLAM HCL 2 MG/2ML IJ SOLN
INTRAMUSCULAR | Status: DC | PRN
  Administered 2020-03-27: 1 mg via INTRAVENOUS

## 2020-03-27 MED ORDER — LIDOCAINE HCL (PF) 1 % IJ SOLN
INTRAMUSCULAR | Status: DC | PRN
  Administered 2020-03-27: 15 mL

## 2020-03-27 MED ORDER — FENTANYL CITRATE (PF) 100 MCG/2ML IJ SOLN
INTRAMUSCULAR | Status: AC
  Filled 2020-03-27: qty 2

## 2020-03-27 MED ORDER — HEPARIN (PORCINE) IN NACL 1000-0.9 UT/500ML-% IV SOLN
INTRAVENOUS | Status: AC
  Filled 2020-03-27: qty 1000

## 2020-03-27 MED ORDER — MIDAZOLAM HCL 2 MG/2ML IJ SOLN
INTRAMUSCULAR | Status: AC
  Filled 2020-03-27: qty 2

## 2020-03-27 MED ORDER — SODIUM CHLORIDE 0.9% FLUSH: 3.0000 mL | Freq: Two times a day (BID) | INTRAVENOUS | Status: DC

## 2020-03-27 MED ORDER — HEPARIN (PORCINE) IN NACL 1000-0.9 UT/500ML-% IV SOLN
INTRAVENOUS | Status: DC | PRN
  Administered 2020-03-27: 500 mL

## 2020-03-27 SURGICAL SUPPLY — 9 items
CATH INFINITI 5FR ANG PIGTAIL (CATHETERS) ×2 IMPLANT
CATH INFINITI 5FR JL4 (CATHETERS) ×2 IMPLANT
CATH INFINITI JR4 5F (CATHETERS) ×2 IMPLANT
DEVICE CLOSURE MYNXGRIP 5F (Vascular Products) ×2 IMPLANT
KIT MANI 3VAL PERCEP (MISCELLANEOUS) ×2 IMPLANT
NEEDLE PERC 18GX7CM (NEEDLE) ×2 IMPLANT
PACK CARDIAC CATH (CUSTOM PROCEDURE TRAY) ×2 IMPLANT
SHEATH AVANTI 5FR X 11CM (SHEATH) ×2 IMPLANT
WIRE GUIDERIGHT .035X150 (WIRE) ×2 IMPLANT

## 2020-03-27 NOTE — Progress Notes (Signed)
After multiple discussions with MD, pt. And his wife decided to go home today, see surgeon for CABG, and potentially schedule OR for next week.

## 2020-03-27 NOTE — Telephone Encounter (Signed)
Requested Prescriptions  Pending Prescriptions Disp Refills  . amLODipine (NORVASC) 5 MG tablet [Pharmacy Med Name: AMLODIPINE BESYLATE 5 MG TAB] 90 tablet 3    Sig: TAKE 1 TABLET BY MOUTH EVERY DAY     Cardiovascular:  Calcium Channel Blockers Passed - 03/27/2020  2:02 AM      Passed - Last BP in normal range    BP Readings from Last 1 Encounters:  11/14/19 127/77         Passed - Valid encounter within last 6 months    Recent Outpatient Visits          4 months ago Diabetes mellitus without complication [facility])   [facility] Jerrol Banana., MD   8 months ago Diabetes mellitus without complication [facility])   [facility] Jerrol Banana., MD   1 year ago Diabetes mellitus without complication [facility])   [facility] Jerrol Banana., MD   1 year ago Essential (primary) hypertension   [facility] Jerrol Banana., MD   1 year ago Diabetes mellitus type 2 in obese [facility])   [facility] Jerrol Banana., MD

## 2020-03-27 NOTE — H&P (Signed)
Chief Complaint: Chief Complaint  Patient presents with  . 6 weeks followup  Date of Service: 03/19/2020 Date of Birth: August 29, 1956 PCP: Dwaine Gale, MD  History of Present Illness: Mr. Gabrielsen is a 64 y.o.male patient with a past medical history significant for hypertension and type 2 diabetes who presents for a follow-up visit. Admits to a several month history of mid-sternal, exertional chest pressure with associated shortness of breath. Denies radiating factors or associated diaphoresis. Symptoms occur a few minutes after onset of activity and are aggravated by colder weather. Recently started on Symbicort by PCP with some improvement in symptoms. Otherwise doing well and denies palpitations or heart racing sensation. Denies lower extremity swelling or orthopnea. Denies dizziness, lightheadedness, or syncopal/presyncopal episodes. Currently increasing activity level and working on dietary modifications to lose weight. He underwent a functional study which showed a small area of borderline reversible ischemia in the inferior wall. Based on this he underwent a cardiac CT angiogram with FFR CT. This revealed evidence of a possible occluded RCA with proximal LAD stenosis with some diagonal disease. Patient continues to have chest discomfort. Risk and benefits of left heart cath were explained to the patient and his wife, like to proceed. We will pursue left heart cath to evaluate coronary anatomy. Continue with current regimen for now..  Past Medical and Surgical History  Past Medical History Past Medical History:  Diagnosis Date  . Diabetes mellitus without complication (CMS-HCC)  . Hypertension   Past Surgical History He has no past surgical history on file.   Medications and Allergies  Current Medications  Current Outpatient Medications  Medication Sig Dispense Refill  . ACCU-CHEK GUIDE ME GLUCOSE MTR Misc TEST DAILY  . ACCU-CHEK GUIDE test strip  . amLODIPine (NORVASC) 5 MG  tablet Take 1 tablet by mouth once daily  . budesonide-formoteroL (SYMBICORT) 80-4.5 mcg/actuation inhaler Inhale 2 inhalations into the lungs 2 (two) times daily  . lancets (ONETOUCH DELICA LANCETS) 33 gauge Misc 2 (two) times daily. for testing  . lisinopril (ZESTRIL) 40 MG tablet Take 1 tablet by mouth once daily  . metFORMIN (GLUCOPHAGE) 500 MG tablet Take 500 mg by mouth 2 (two) times daily with meals   No current facility-administered medications for this visit.   Allergies: Patient has no known allergies.  Social and Family History  Social History reports that he has never smoked. He has never used smokeless tobacco. He reports current alcohol use. He reports previous drug use.  Family History Family History  Problem Relation Age of Onset  . Diabetic kidney disease Mother  . High blood pressure (Hypertension) Mother  . Heart disease Mother  . Heart disease Father  . Coronary Artery Disease (Blocked arteries around heart) Father   Review of Systems  Review of Systems  Constitutional: Negative for chills, diaphoresis, fever, malaise/fatigue and weight loss.  HENT: Negative for congestion, ear discharge, hearing loss and tinnitus.  Eyes: Negative for blurred vision.  Respiratory: Positive for shortness of breath. Negative for cough, hemoptysis, sputum production and wheezing.  Cardiovascular: Positive for chest pain. Negative for palpitations, orthopnea, claudication, leg swelling and PND.  Gastrointestinal: Negative for abdominal pain, blood in stool, constipation, diarrhea, heartburn, melena, nausea and vomiting.  Genitourinary: Negative for dysuria, frequency, hematuria and urgency.  Musculoskeletal: Negative for back pain, falls, joint pain and myalgias.  Skin: Negative for itching and rash.  Neurological: Negative for dizziness, tingling, focal weakness, loss of consciousness, weakness and headaches.  Endo/Heme/Allergies: Negative for polydipsia. Does not bruise/bleed  easily.  Psychiatric/Behavioral: Negative for depression, memory loss and substance abuse. The patient is not nervous/anxious.   Physical Examination   Vitals:BP 140/84  Pulse 82  Resp 16  Ht 177.8 cm (5\' 10" )  Wt (!) 110.2 kg (243 lb)  SpO2 96%  BMI 34.87 kg/m  Ht:177.8 cm (5\' 10" ) Wt:(!) 110.2 kg (243 lb) DUK:GURK surface area is 2.33 meters squared. Body mass index is 34.87 kg/m.  Wt Readings from Last 3 Encounters:  03/19/20 (!) 110.2 kg (243 lb)  03/15/20 (!) 108.9 kg (240 lb)  03/06/20 (!) 108.9 kg (240 lb)   BP Readings from Last 3 Encounters:  03/19/20 140/84  03/15/20 114/72  03/15/20 121/79   General appearance appears in no acute distress  Head Mouth and Eye exam Normocephalic, without obvious abnormality, atraumatic Dentition is good Eyes appear anicteric       LUNGS Breath Sounds: Normal Percussion: Normal  CARDIOVASCULAR JVP CV wave: no HJR: no Elevation at 90 degrees: None Carotid Pulse: normal pulsation bilaterally Bruit: None Apex: apical impulse normal  Auscultation Rhythm: normal sinus rhythm S1: normal S2: normal Clicks: no Rub: no Murmurs: no murmurs  Gallop: None  EXTREMITIES Clubbing: no Edema: trace to 1+ bilateral pedal edema Pulses: peripheral pulses symmetrical Femoral Bruits: no Amputation: no SKIN Rash: no Cyanosis: no Embolic phemonenon: no Bruising: no NEURO Alert and Oriented to person, place and time: yes Non focal: yes  PSYCH: Pt appears to have normal affect  LABS REVIEWED Last 3 CBC results: Lab Results  Component Value Date  WBC 6.2 03/19/2020   Lab Results  Component Value Date  HGB 13.9 (L) 03/19/2020   Lab Results  Component Value Date  HCT 41.3 03/19/2020   Lab Results  Component Value Date  PLT 178 03/19/2020   Lab Results  Component Value Date  CREATININE 1.4 (H) 02/16/2020  BUN 23 02/16/2020  NA 139 02/16/2020  K 4.0 02/16/2020  CL 104 02/16/2020  CO2 24.7 02/16/2020    No results found for: HGBA1C  No results found for: HDL No results found for: LDLCALC No results found for: TRIG  No results found for: ALT, AST, GGT, ALKPHOS, BILITOT  No results found for: TSH  Diagnostic Studies Reviewed:  EKG EKG demonstrated normal sinus rhythm, nonspecific ST and T waves changes.  Assessment and Plan   64 y.o. male with  ICD-10-CM  1. Chest pain with moderate risk of acute coronary syndrome-patient has some typical features of angina with borderline changes on a functional study. 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 Risk and benefit left heart cath were explained to the patient and his wife. Plan to proceed with left heart cath to evaluate coronary anatomy to guide further therapy. Avoid activity causing symptoms until cath can be completed. R07.9  2. Essential (primary) hypertension-continue with current regimen. I10   Return in about 4 weeks (around 04/16/2020).  These notes generated with voice recognition software. I apologize for typographical errors.  Sydnee Levans, MD   Pt seen and examined. No change from above.

## 2020-03-27 NOTE — Progress Notes (Signed)
Dr. Ubaldo Glassing at bedside, speaking with pt. And his spouse Juliann Pulse re: cath results. Both verbalize understanding of conversation and description of issues utilitzing cardiac display board.

## 2020-03-28 ENCOUNTER — Encounter: Payer: Self-pay | Admitting: Cardiology

## 2020-03-29 ENCOUNTER — Encounter: Payer: Self-pay | Admitting: Family Medicine

## 2020-03-30 DIAGNOSIS — I2510 Atherosclerotic heart disease of native coronary artery without angina pectoris: Secondary | ICD-10-CM | POA: Insufficient documentation

## 2020-03-30 DIAGNOSIS — I2583 Coronary atherosclerosis due to lipid rich plaque: Secondary | ICD-10-CM | POA: Insufficient documentation

## 2020-04-03 ENCOUNTER — Other Ambulatory Visit: Payer: Self-pay | Admitting: Family Medicine

## 2020-04-03 DIAGNOSIS — J45909 Unspecified asthma, uncomplicated: Secondary | ICD-10-CM

## 2020-04-03 NOTE — Telephone Encounter (Signed)
Requested Prescriptions  Pending Prescriptions Disp Refills  . albuterol (VENTOLIN HFA) 108 (90 Base) MCG/ACT inhaler [Pharmacy Med Name: ALBUTEROL HFA (PROVENTIL) INH] 18 g 2    Sig: TAKE 2 PUFFS BY MOUTH EVERY 6 HOURS AS NEEDED FOR WHEEZE OR SHORTNESS OF BREATH     Pulmonology:  Beta Agonists Failed - 04/03/2020  1:28 AM      Failed - One inhaler should last at least one month. If the patient is requesting refills earlier, contact the patient to check for uncontrolled symptoms.      Passed - Valid encounter within last 12 months    Recent Outpatient Visits          4 months ago Diabetes mellitus without complication [facility])   [facility] Jerrol Banana., MD   8 months ago Diabetes mellitus without complication [facility])   [facility] Jerrol Banana., MD   1 year ago Diabetes mellitus without complication [facility])   [facility] Jerrol Banana., MD   1 year ago Essential (primary) hypertension   [facility] Jerrol Banana., MD   1 year ago Diabetes mellitus type 2 in obese [facility])   [facility] Jerrol Banana., MD

## 2020-04-05 DIAGNOSIS — Z951 Presence of aortocoronary bypass graft: Secondary | ICD-10-CM | POA: Insufficient documentation

## 2020-04-05 DIAGNOSIS — G8918 Other acute postprocedural pain: Secondary | ICD-10-CM | POA: Insufficient documentation

## 2020-04-06 ENCOUNTER — Other Ambulatory Visit: Payer: Self-pay | Admitting: Family Medicine

## 2020-04-12 NOTE — Progress Notes (Deleted)
    Trena Platt Aries Townley,acting as a scribe for [name], MD.,have documented all relevant documentation on the behalf of [name], MD,as directed by  [name], MD while in the presence of [name], MD.  Established patient visit   Patient: Reginald Murphy   DOB: [birth year]   64 y.o. Male  MRN: [id number] Visit Date: 04/16/2020  Today's healthcare provider: [name], MD   No chief complaint on file.  Subjective    HPI   Follow up Hospitalization  Patient was admitted to [facility] on 04/04/20 and discharged on 04/12/20. He was treated for Dysphagia, CAD, Anemia, and diabetes. Treatment for this included see chart notes. Telephone follow up was not done.  He reports {excellent/good/fair:19992} compliance with treatment. He reports this condition is {resolved/improved/worsened:23923}.  ----------------------------------------------------------------------------------------- -   {Show patient history (optional):23778::" "}   Medications: Outpatient Medications Prior to Visit  Medication Sig  . ACCU-CHEK GUIDE test strip Please specify directions, refills and quantity  . albuterol (VENTOLIN HFA) 108 (90 Base) MCG/ACT inhaler TAKE 2 PUFFS BY MOUTH EVERY 6 HOURS AS NEEDED FOR WHEEZE OR SHORTNESS OF BREATH  . amLODipine (NORVASC) 5 MG tablet TAKE 1 TABLET BY MOUTH EVERY DAY  . aspirin EC 81 MG tablet Take 81 mg by mouth daily.  . Blood Glucose Monitoring Suppl (ACCU-CHEK NANO SMARTVIEW) w/Device KIT 1 strip by Does not apply route daily.  Marland Kitchen glucose blood test strip 1 each by Other route as needed for other. Use as instructed  . lisinopril (ZESTRIL) 40 MG tablet TAKE 1 TABLET (40 MG TOTAL) BY MOUTH DAILY. (Patient taking differently: Take 40 mg by mouth at bedtime. )  . metFORMIN (GLUCOPHAGE) 1000 MG tablet TAKE 1 TABLET BY MOUTH 2 TIMES DAILY WITH A MEAL.  Marland Kitchen ONETOUCH DELICA LANCETS 27M MISC 2 (two) times daily. for testing  . SYMBICORT  80-4.5 MCG/ACT inhaler TAKE 2 PUFFS BY MOUTH TWICE A DAY (Patient taking differently: Inhale 2 puffs into the lungs 2 (two) times daily as needed (respiratory issues.). )  . tadalafil (CIALIS) 20 MG tablet Take 1 tablet (20 mg total) by mouth every three (3) days as needed for erectile dysfunction.   No facility-administered medications prior to visit.    Review of Systems  Constitutional: Negative for appetite change, chills and fever.  Respiratory: Negative for chest tightness, shortness of breath and wheezing.   Cardiovascular: Negative for chest pain and palpitations.  Gastrointestinal: Negative for abdominal pain, nausea and vomiting.    {Heme  Chem  Endocrine  Serology  Results Review (optional):23779::" "}  Objective    There were no vitals taken for this visit. {Show previous vital signs (optional):23777::" "}  Physical Exam  ***  No results found for any visits on 04/16/20.  Assessment & Plan     ***  No follow-ups on file.      {provider attestation***:1}   [name], MD  [facility] [phone] (phone) [phone] (fax)  [place]

## 2020-04-16 ENCOUNTER — Inpatient Hospital Stay: Payer: BLUE CROSS/BLUE SHIELD | Admitting: Family Medicine

## 2020-05-01 NOTE — Progress Notes (Signed)
Trena Platt Cummings,acting as a scribe for [name], MD.,have documented all relevant documentation on the behalf of [name], MD,as directed by  [name], MD while in the presence of [name], MD.  Established patient visit   Patient: Reginald Murphy   DOB: 1956-06-23   64 y.o. Male  MRN: [id number] Visit Date: 05/02/2020  Today's healthcare provider: [name], MD   Chief Complaint  Patient presents with  . Surgery Follow up    Subjective    HPI   Patient presents today in office for a follow up from triple bypass surgery that the patient had done on April 30, 2020.  He is recovering well from his bypass surgery. No problems since bypass. With his diabetes he has had no discernible hypoglycemia. Patient would like to know if he should still be taking Trulicity? Patient said that they changed a lot of his medications after his surgery.   Past Surgical History:  Procedure Laterality Date  . LEFT HEART CATH AND CORONARY ANGIOGRAPHY Left 03/27/2020   Procedure: LEFT HEART CATH AND CORONARY ANGIOGRAPHY;  Surgeon: Teodoro Spray, MD;  Location: [place] [facility];  Service: Cardiovascular;  Laterality: Left;       Medications: Outpatient Medications Prior to Visit  Medication Sig  . atorvastatin (LIPITOR) 40 MG tablet Take 40 mg by mouth at bedtime.  . Blood Glucose Monitoring Suppl (ACCU-CHEK NANO SMARTVIEW) w/Device KIT 1 strip by Does not apply route daily.  Marland Kitchen glipiZIDE (GLUCOTROL XL) 10 MG 24 hr tablet Take by mouth.  Marland Kitchen glucose blood test strip 1 each by Other route as needed for other. Use as instructed  . metFORMIN (GLUCOPHAGE) 1000 MG tablet TAKE 1 TABLET BY MOUTH 2 TIMES DAILY WITH A MEAL.  Marland Kitchen ONETOUCH DELICA LANCETS 89V MISC 2 (two) times daily. for testing  . TRULICITY 6.94 HW/3.8UE SOPN SMARTSIG:0.5 Milliliter(s) SUB-Q Once a Week  . ACCU-CHEK GUIDE test strip Please specify directions, refills and quantity (Patient  not taking: Reported on 05/02/2020)  . albuterol (VENTOLIN HFA) 108 (90 Base) MCG/ACT inhaler TAKE 2 PUFFS BY MOUTH EVERY 6 HOURS AS NEEDED FOR WHEEZE OR SHORTNESS OF BREATH (Patient not taking: Reported on 05/02/2020)  . amLODipine (NORVASC) 5 MG tablet TAKE 1 TABLET BY MOUTH EVERY DAY (Patient not taking: Reported on 05/02/2020)  . aspirin EC 81 MG tablet Take 81 mg by mouth daily. (Patient not taking: Reported on 05/02/2020)  . lisinopril (ZESTRIL) 40 MG tablet TAKE 1 TABLET (40 MG TOTAL) BY MOUTH DAILY. (Patient not taking: Reported on 05/02/2020)  . SYMBICORT 80-4.5 MCG/ACT inhaler TAKE 2 PUFFS BY MOUTH TWICE A DAY (Patient not taking: Reported on 05/02/2020)  . tadalafil (CIALIS) 20 MG tablet Take 1 tablet (20 mg total) by mouth every three (3) days as needed for erectile dysfunction. (Patient not taking: Reported on 05/02/2020)  . [DISCONTINUED] atorvastatin (LIPITOR) 10 MG tablet Take 20 mg by mouth at bedtime.   No facility-administered medications prior to visit.    Review of Systems  Last hemoglobin A1c Lab Results  Component Value Date   HGBA1C 7.3 (A) 11/14/2019      Objective    BP 107/71 (BP Location: Right Arm, Patient Position: Sitting, Cuff Size: Normal)   Pulse 79   Temp 98.6 F (37 C) (Oral)   Wt 224 lb 6.4 oz (101.8 kg)   BMI 32.20 kg/m  BP Readings from Last 3 Encounters:  05/02/20 107/71  03/27/20 Marland Kitchen)  156/88  11/14/19 127/77   Wt Readings from Last 3 Encounters:  05/02/20 224 lb 6.4 oz (101.8 kg)  03/27/20 240 lb (108.9 kg)  11/14/19 242 lb (109.8 kg)      Physical Exam Vitals reviewed.  Constitutional:      Appearance: He is well-developed.  HENT:     Head: Normocephalic and atraumatic.     Right Ear: External ear normal.     Left Ear: External ear normal.     Nose: Nose normal.  Eyes:     Conjunctiva/sclera: Conjunctivae normal.     Pupils: Pupils are equal, round, and reactive to light.  Cardiovascular:     Rate and Rhythm: Normal rate and  regular rhythm.     Heart sounds: Normal heart sounds.     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 Pulmonary:     Effort: Pulmonary effort is normal.     Breath sounds: Normal breath sounds.  Abdominal:     General: Bowel sounds are normal.     Palpations: Abdomen is soft.  Genitourinary:    Penis: Normal.      Prostate: Normal.     Rectum: Normal.  Musculoskeletal:     Cervical back: Normal range of motion and neck supple.     Right lower leg: No edema.     Left lower leg: No edema.  Skin:    General: Skin is warm and dry.  Neurological:     General: No focal deficit present.     Mental Status: He is alert and oriented to person, place, and time.     Deep Tendon Reflexes: Reflexes are normal and symmetric.  Psychiatric:        Mood and Affect: Mood normal.        Behavior: Behavior normal.        Thought Content: Thought content normal.        Judgment: Judgment normal.    A1C is 5.9   No results found for any visits on 05/02/20.  Assessment & Plan     1. Diabetes mellitus  A1c is 5.9. Restart Trulicity but discontinue glipizide. If he loses weight in the future may consider stopping Trulicity. Continue Metformin. Follow-up 3 months. - POCT HgB Z6X - TRULICITY 0.96 EA/5.4UJ SOPN; SMARTSIG:0.5 Milliliter(s) SUB-Q Once a Week  Dispense: 12 mL; Refill: 3  2. Hyperlipidemia associated with type 2 diabetes mellitus ([place]) On atorvastatin 40  3. Type 2 diabetes mellitus with other specified complication, without long-term current use of insulin (HCC) Metformin and Trulicity  4. S/P CABG x 3 Healing well. All risk factors treated.   No follow-ups on file.      I, [name], MD, have reviewed all documentation for this visit. The documentation on 05/04/20 for the exam, diagnosis, procedures, and orders are all accurate and complete.    Jaylanni Eltringham Cranford Mon, MD  [facility] [phone] (phone) [phone] (fax)  [place]

## 2020-05-02 ENCOUNTER — Other Ambulatory Visit: Payer: Self-pay

## 2020-05-02 ENCOUNTER — Encounter: Payer: Self-pay | Admitting: Family Medicine

## 2020-05-02 ENCOUNTER — Ambulatory Visit (INDEPENDENT_AMBULATORY_CARE_PROVIDER_SITE_OTHER): Payer: 59 | Admitting: Family Medicine

## 2020-05-02 VITALS — BP 107/71 | HR 79 | Temp 98.6°F | Wt 224.4 lb

## 2020-05-02 DIAGNOSIS — E785 Hyperlipidemia, unspecified: Secondary | ICD-10-CM | POA: Diagnosis not present

## 2020-05-02 DIAGNOSIS — Z951 Presence of aortocoronary bypass graft: Secondary | ICD-10-CM

## 2020-05-02 DIAGNOSIS — E1169 Type 2 diabetes mellitus with other specified complication: Secondary | ICD-10-CM | POA: Diagnosis not present

## 2020-05-02 DIAGNOSIS — E119 Type 2 diabetes mellitus without complications: Secondary | ICD-10-CM

## 2020-05-02 MED ORDER — TRULICITY 0.75 MG/0.5ML ~~LOC~~ SOAJ: SUBCUTANEOUS | 3 refills | Status: DC

## 2020-05-02 NOTE — Patient Instructions (Signed)
Stop Glipizide

## 2020-05-10 ENCOUNTER — Encounter: Payer: 59 | Attending: Cardiology | Admitting: *Deleted

## 2020-05-10 ENCOUNTER — Other Ambulatory Visit: Payer: Self-pay

## 2020-05-10 ENCOUNTER — Encounter: Payer: Self-pay | Admitting: *Deleted

## 2020-05-10 DIAGNOSIS — Z951 Presence of aortocoronary bypass graft: Secondary | ICD-10-CM | POA: Insufficient documentation

## 2020-05-10 NOTE — Progress Notes (Signed)
Completed virtual orientation today.  EP evaluation is scheduled for Tues 9/7 at 9am.  Documentation for diagnosis can be found in [facility] encounter 04/04/20.

## 2020-05-15 ENCOUNTER — Encounter: Payer: 59 | Admitting: *Deleted

## 2020-05-15 ENCOUNTER — Other Ambulatory Visit: Payer: Self-pay

## 2020-05-15 VITALS — Ht 69.5 in | Wt 221.3 lb

## 2020-05-15 DIAGNOSIS — Z951 Presence of aortocoronary bypass graft: Secondary | ICD-10-CM

## 2020-05-15 NOTE — Progress Notes (Signed)
Cardiac Individual Treatment Plan  Patient Details  Name: Reginald Murphy MRN: [id number] Date of Birth: April 21, 1956 Referring Provider:     Cardiac Rehab from 05/15/2020 in [facility] Cardiac and Pulmonary Rehab  Referring Provider Bartholome Bill MD      Initial Encounter Date:    Cardiac Rehab from 05/15/2020 in [facility] Cardiac and Pulmonary Rehab  Date 05/15/20      Visit Diagnosis: No diagnosis found.  Patient's Home Medications on Admission:  Current Outpatient Medications:    ACCU-CHEK GUIDE test strip, Please specify directions, refills and quantity, Disp: 100 each, Rfl: 11   albuterol (VENTOLIN HFA) 108 (90 Base) MCG/ACT inhaler, TAKE 2 PUFFS BY MOUTH EVERY 6 HOURS AS NEEDED FOR WHEEZE OR SHORTNESS OF BREATH (Patient not taking: Reported on 05/02/2020), Disp: 18 g, Rfl: 2   amLODipine (NORVASC) 5 MG tablet, TAKE 1 TABLET BY MOUTH EVERY DAY (Patient not taking: Reported on 05/02/2020), Disp: 90 tablet, Rfl: 0   aspirin EC 81 MG tablet, Take 81 mg by mouth daily. , Disp: , Rfl:    atorvastatin (LIPITOR) 40 MG tablet, Take 40 mg by mouth at bedtime., Disp: , Rfl:    Blood Glucose Monitoring Suppl (ACCU-CHEK NANO SMARTVIEW) w/Device KIT, 1 strip by Does not apply route daily., Disp: 1 kit, Rfl: 0   glipiZIDE (GLUCOTROL XL) 10 MG 24 hr tablet, Take by mouth. (Patient not taking: Reported on 05/10/2020), Disp: , Rfl:    glucose blood test strip, 1 each by Other route as needed for other. Use as instructed, Disp: , Rfl:    lisinopril (ZESTRIL) 40 MG tablet, TAKE 1 TABLET (40 MG TOTAL) BY MOUTH DAILY. (Patient not taking: Reported on 05/02/2020), Disp: 90 tablet, Rfl: 3   metFORMIN (GLUCOPHAGE) 1000 MG tablet, TAKE 1 TABLET BY MOUTH 2 TIMES DAILY WITH A MEAL., Disp: 180 tablet, Rfl: 0   metoprolol tartrate (LOPRESSOR) 25 MG tablet, Take by mouth., Disp: , Rfl:    nitroGLYCERIN (NITROSTAT) 0.4 MG SL tablet, SMARTSIG:1 Tablet(s) Sublingual PRN, Disp: , Rfl:    ONETOUCH DELICA LANCETS 94B MISC,  2 (two) times daily. for testing, Disp: , Rfl: 12   SYMBICORT 80-4.5 MCG/ACT inhaler, TAKE 2 PUFFS BY MOUTH TWICE A DAY (Patient not taking: Reported on 05/02/2020), Disp: 30.6 Inhaler, Rfl: 1   tadalafil (CIALIS) 20 MG tablet, Take 1 tablet (20 mg total) by mouth every three (3) days as needed for erectile dysfunction., Disp: 30 tablet, Rfl: 5   TRULICITY 0.96 GE/3.6OQ SOPN, SMARTSIG:0.5 Milliliter(s) SUB-Q Once a Week, Disp: 12 mL, Rfl: 3  Past Medical History: Past Medical History:  Diagnosis Date   Diabetes mellitus without complication ([place])    Hypertension     Tobacco Use: Social History   Tobacco Use  Smoking Status Never Smoker  Smokeless Tobacco Never Used    Labs: Recent Review Flowsheet Data    Labs for ITP Cardiac and Pulmonary Rehab Latest Ref Rng & Units 07/01/2018 10/11/2018 03/03/2019 07/18/2019 11/14/2019   Cholestrol 100 - 199 mg/dL - - 176 - -   LDLCALC 0 - 99 mg/dL - - 80 - -   HDL >39 mg/dL - - 30(L) - -   Trlycerides 0 - 149 mg/dL - - 329(H) - -   Hemoglobin A1c 4.0 - 5.6 % 7.7(A) 7.5(A) 7.0(A) 7.0(A) 7.3(A)       Exercise Target Goals: Exercise Program Goal: Individual exercise prescription set using results from initial 6 min walk test and THRR while considering  patients activity  barriers and safety.   Exercise Prescription Goal: Initial exercise prescription builds to 30-45 minutes a day of aerobic activity, 2-3 days per week.  Home exercise guidelines will be given to patient during program as part of exercise prescription that the participant will acknowledge.   Education: Aerobic Exercise & Resistance Training: - Gives group verbal and written instruction on the various components of exercise. Focuses on aerobic and resistive training programs and the benefits of this training and how to safely progress through these programs..   Education: Exercise & Equipment Safety: - Individual verbal instruction and demonstration of equipment use and  safety with use of the equipment.   Cardiac Rehab from 05/15/2020 in [facility] Cardiac and Pulmonary Rehab  Date 05/15/20  Educator [facility]  Instruction Review Code 1- Verbalizes Understanding      Education: Exercise Physiology & General Exercise Guidelines: - Group verbal and written instruction with models to review the exercise physiology of the cardiovascular system and associated critical values. Provides general exercise guidelines with specific guidelines to those with heart or lung disease.    Cardiac Rehab from 05/15/2020 in [facility] Cardiac and Pulmonary Rehab  Date 05/15/20  Instruction Review Code 3- Needs Reinforcement  [need identified]      Education: Flexibility, Balance, Mind/Body Relaxation: Provides group verbal/written instruction on the benefits of flexibility and balance training, including mind/body exercise modes such as yoga, pilates and tai chi.  Demonstration and skill practice provided.   Activity Barriers & Risk Stratification:  Activity Barriers & Cardiac Risk Stratification - 05/15/20 1310      Activity Barriers & Cardiac Risk Stratification   Activity Barriers Deconditioning;Muscular Weakness;Balance Concerns    Cardiac Risk Stratification High           6 Minute Walk:  6 Minute Walk    Row Name 05/15/20 1307         6 Minute Walk   Phase Initial     Distance 1235 feet     Walk Time 6 minutes     # of Rest Breaks 0     MPH 2.34     METS 3.1     RPE 8     VO2 Peak 10.84     Symptoms No     Resting HR 86 bpm     Resting BP 124/62     Resting Oxygen Saturation  98 %     Exercise Oxygen Saturation  during 6 min walk 99 %     Max Ex. HR 114 bpm     Max Ex. BP 130/64     2 Minute Post BP 134/70            Oxygen Initial Assessment:   Oxygen Re-Evaluation:   Oxygen Discharge (Final Oxygen Re-Evaluation):   Initial Exercise Prescription:  Initial Exercise Prescription - 05/15/20 1300      Date of Initial Exercise RX and Referring Provider     Date 05/15/20    Referring Provider Bartholome Bill MD      Treadmill   MPH 2.3    Grade 1    Minutes 15    METs 3.08      Elliptical   Level 1    Speed 2.7    Minutes 15    METs 3      REL-XR   Level 3    Speed 50    Minutes 15    METs 3      Prescription Details   Frequency (times per  week) 2    Duration Progress to 30 minutes of continuous aerobic without signs/symptoms of physical distress      Intensity   THRR 40-80% of Max Heartrate 115-144    Ratings of Perceived Exertion 11-13    Perceived Dyspnea 0-4      Progression   Progression Continue to progress workloads to maintain intensity without signs/symptoms of physical distress.      Resistance Training   Training Prescription Yes    Weight 3 lb    Reps 10-15           Perform Capillary Blood Glucose checks as needed.  Exercise Prescription Changes:  Exercise Prescription Changes    Row Name 05/15/20 1300             Response to Exercise   Blood Pressure (Admit) 124/62       Blood Pressure (Exercise) 130/64       Blood Pressure (Exit) 134/70       Heart Rate (Admit) 86 bpm       Heart Rate (Exercise) 114 bpm       Heart Rate (Exit) 87 bpm       Oxygen Saturation (Admit) 98 %       Oxygen Saturation (Exercise) 99 %       Rating of Perceived Exertion (Exercise) 8       Symptoms none       Comments walk test results              Exercise Comments:   Exercise Goals and Review:  Exercise Goals    Row Name 05/15/20 1314             Exercise Goals   Increase Physical Activity Yes       Intervention Provide advice, education, support and counseling about physical activity/exercise needs.;Develop an individualized exercise prescription for aerobic and resistive training based on initial evaluation findings, risk stratification, comorbidities and participant's personal goals.       Expected Outcomes Short Term: Attend rehab on a regular basis to increase amount of physical  activity.;Long Term: Add in home exercise to make exercise part of routine and to increase amount of physical activity.;Long Term: Exercising regularly at least 3-5 days a week.       Increase Strength and Stamina Yes       Intervention Develop an individualized exercise prescription for aerobic and resistive training based on initial evaluation findings, risk stratification, comorbidities and participant's personal goals.;Provide advice, education, support and counseling about physical activity/exercise needs.       Expected Outcomes Short Term: Increase workloads from initial exercise prescription for resistance, speed, and METs.;Short Term: Perform resistance training exercises routinely during rehab and add in resistance training at home;Long Term: Improve cardiorespiratory fitness, muscular endurance and strength as measured by increased METs and functional capacity (6MWT)       Able to understand and use rate of perceived exertion (RPE) scale Yes       Intervention Provide education and explanation on how to use RPE scale       Expected Outcomes Short Term: Able to use RPE daily in rehab to express subjective intensity level;Long Term:  Able to use RPE to guide intensity level when exercising independently       Able to understand and use Dyspnea scale Yes       Intervention Provide education and explanation on how to use Dyspnea scale       Expected Outcomes Short Term:  Able to use Dyspnea scale daily in rehab to express subjective sense of shortness of breath during exertion;Long Term: Able to use Dyspnea scale to guide intensity level when exercising independently       Knowledge and understanding of Target Heart Rate Range (THRR) Yes       Intervention Provide education and explanation of THRR including how the numbers were predicted and where they are located for reference       Expected Outcomes Short Term: Able to state/look up THRR;Short Term: Able to use daily as guideline for intensity in  rehab;Long Term: Able to use THRR to govern intensity when exercising independently       Able to check pulse independently Yes       Intervention Provide education and demonstration on how to check pulse in carotid and radial arteries.;Review the importance of being able to check your own pulse for safety during independent exercise       Expected Outcomes Short Term: Able to explain why pulse checking is important during independent exercise;Long Term: Able to check pulse independently and accurately       Understanding of Exercise Prescription Yes       Intervention Provide education, explanation, and written materials on patient's individual exercise prescription       Expected Outcomes Short Term: Able to explain program exercise prescription;Long Term: Able to explain home exercise prescription to exercise independently              Exercise Goals Re-Evaluation :   Discharge Exercise Prescription (Final Exercise Prescription Changes):  Exercise Prescription Changes - 05/15/20 1300      Response to Exercise   Blood Pressure (Admit) 124/62    Blood Pressure (Exercise) 130/64    Blood Pressure (Exit) 134/70    Heart Rate (Admit) 86 bpm    Heart Rate (Exercise) 114 bpm    Heart Rate (Exit) 87 bpm    Oxygen Saturation (Admit) 98 %    Oxygen Saturation (Exercise) 99 %    Rating of Perceived Exertion (Exercise) 8    Symptoms none    Comments walk test results           Nutrition:  Target Goals: Understanding of nutrition guidelines, daily intake of sodium <1535m, cholesterol <2069m calories 30% from fat and 7% or less from saturated fats, daily to have 5 or more servings of fruits and vegetables.  Education: Controlling Sodium/Reading Food Labels -Group verbal and written material supporting the discussion of sodium use in heart healthy nutrition. Review and explanation with models, verbal and written materials for utilization of the food label.   Education: General  Nutrition Guidelines/Fats and Fiber: -Group instruction provided by verbal, written material, models and posters to present the general guidelines for heart healthy nutrition. Gives an explanation and review of dietary fats and fiber.   [facility]  Date 05/15/20  Instruction Review Code 3- Needs Reinforcement  [need identified]      Biometrics:  Pre Biometrics - 05/15/20 1314      Pre Biometrics   Height 5' 9.5" (1.765 m)    Weight 221 lb 4.8 oz (100.4 kg)    BMI (Calculated) 32.22    Single Leg Stand 4.72 seconds            Nutrition Therapy Plan and Nutrition Goals:   Nutrition Assessments:  Nutrition Assessments - 05/15/20 1326      MEDFICTS Scores   Pre Score 74  MEDIFICTS Score Key:          ?70 Need to make dietary changes          40-70 Heart Healthy Diet         ? 40 Therapeutic Level Cholesterol Diet  Nutrition Goals Re-Evaluation:   Nutrition Goals Discharge (Final Nutrition Goals Re-Evaluation):   Psychosocial: Target Goals: Acknowledge presence or absence of significant depression and/or stress, maximize coping skills, provide positive support system. Participant is able to verbalize types and ability to use techniques and skills needed for reducing stress and depression.   Education: Depression - Provides group verbal and written instruction on the correlation between heart/lung disease and depressed mood, treatment options, and the stigmas associated with seeking treatment.   Education: Sleep Hygiene -Provides group verbal and written instruction about how sleep can affect your health.  Define sleep hygiene, discuss sleep cycles and impact of sleep habits. Review good sleep hygiene tips.     Education: Stress and Anxiety: - Provides group verbal and written instruction about the health risks of elevated stress and causes of high stress.  Discuss the correlation between heart/lung  disease and anxiety and treatment options. Review healthy ways to manage with stress and anxiety.    Initial Review & Psychosocial Screening:  Initial Psych Review & Screening - 05/10/20 0939      Initial Review   Current issues with Current Stress Concerns    Source of Stress Concerns Family    Comments mother has dementia (living at [place])      [place]? Yes   wife, cousins that live near by (he's an only child)     Barriers   Psychosocial barriers to participate in program There are no identifiable barriers or psychosocial needs.      Screening Interventions   Interventions Encouraged to exercise;To provide support and resources with identified psychosocial needs;Provide feedback about the scores to participant    Expected Outcomes Short Term goal: Utilizing psychosocial counselor, staff and physician to assist with identification of specific Stressors or current issues interfering with healing process. Setting desired goal for each stressor or current issue identified.;Long Term Goal: Stressors or current issues are controlled or eliminated.;Short Term goal: Identification and review with participant of any Quality of Life or Depression concerns found by scoring the questionnaire.;Long Term goal: The participant improves quality of Life and PHQ9 Scores as seen by post scores and/or verbalization of changes           Quality of Life Scores:   Quality of Life - 05/15/20 1326      Quality of Life   Select Quality of Life      Quality of Life Scores   Health/Function Pre 29.57 %    Socioeconomic Pre 30 %    Psych/Spiritual Pre 30 %    Family Pre 30 %    GLOBAL Pre 29.82 %          Scores of 19 and below usually indicate a poorer quality of life in these areas.  A difference of  2-3 points is a clinically meaningful difference.  A difference of 2-3 points in the total score of the Quality of Life Index has been associated with significant  improvement in overall quality of life, self-image, physical symptoms, and general health in studies assessing change in quality of life.  PHQ-9: Recent Review Flowsheet Data    Depression screen [facility] 2/9 05/15/2020 07/18/2019 04/01/2017 04/01/2017   Decreased Interest  0 0 0 0   Down, Depressed, Hopeless 0 0 0 0   PHQ - 2 Score 0 0 0 0   Altered sleeping 0 - 2 -   Tired, decreased energy 0 - 1 -   Change in appetite 0 - 0 -   Feeling bad or failure about yourself  0 - 0 -   Trouble concentrating 0 - 0 -   Moving slowly or fidgety/restless 0 - 0 -   Suicidal thoughts 0 - 0 -   PHQ-9 Score 0 - 3 -   Difficult doing work/chores Not difficult at all - - -     Interpretation of Total Score  Total Score Depression Severity:  1-4 = Minimal depression, 5-9 = Mild depression, 10-14 = Moderate depression, 15-19 = Moderately severe depression, 20-27 = Severe depression   Psychosocial Evaluation and Intervention:  Psychosocial Evaluation - 05/10/20 0946      Psychosocial Evaluation & Interventions   Interventions Encouraged to exercise with the program and follow exercise prescription    Comments 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  He is a good spirited man. He is an only child but has a great support system with his wife and cousins that live nearby.  His mother is now living in [facility] with dementia, which got worse while he was having to undergo surgery.  Overall, he is doing very well and eager to get going again.  He wants to get back to his normal and be able to walk on the beach again without having to stop to catch his breath.  He sleeps well at night and has no other major stressors currently.    Expected Outcomes Short: Attend rehab to build stamina  Long: Continue to cope well with recovery    Continue Psychosocial Services  Follow up required by staff           Psychosocial Re-Evaluation:   Psychosocial Discharge (Final Psychosocial Re-Evaluation):   Vocational  Rehabilitation: Provide vocational rehab assistance to qualifying candidates.   Vocational Rehab Evaluation & Intervention:  Vocational Rehab - 05/10/20 2992      Initial Vocational Rehab Evaluation & Intervention   Assessment shows need for Vocational Rehabilitation No   retired          Education: Education Goals: Education classes will be provided on a variety of topics geared toward better understanding of heart health and risk factor modification. Participant will state understanding/return demonstration of topics presented as noted by education test scores.  Learning Barriers/Preferences:  Learning Barriers/Preferences - 05/10/20 4268      Learning Barriers/Preferences   Learning Barriers Sight   glasses for reading   Learning Preferences None           General Cardiac Education Topics:  AED/CPR: - Group verbal and written instruction with the use of models to demonstrate the basic use of the AED with the basic ABC's of resuscitation.   Anatomy & Physiology of the Heart: - Group verbal and written instruction and models provide basic cardiac anatomy and physiology, with the coronary electrical and arterial systems. Review of Valvular disease and Heart Failure   Cardiac Rehab from 05/15/2020 in [facility] Cardiac and Pulmonary Rehab  Date 05/15/20  Instruction Review Code 3- Needs Reinforcement  [need identified]      Cardiac Procedures: - Group verbal and written instruction to review commonly prescribed medications for heart disease. Reviews the medication, class of the drug, and side effects. Includes the  steps to properly store meds and maintain the prescription regimen. (beta blockers and nitrates)   Cardiac Medications I: - Group verbal and written instruction to review commonly prescribed medications for heart disease. Reviews the medication, class of the drug, and side effects. Includes the steps to properly store meds and maintain the prescription  regimen.   Cardiac Medications II: -Group verbal and written instruction to review commonly prescribed medications for heart disease. Reviews the medication, class of the drug, and side effects. (all other drug classes)    Go Sex-Intimacy & Heart Disease, Get SMART - Goal Setting: - Group verbal and written instruction through game format to discuss heart disease and the return to sexual intimacy. Provides group verbal and written material to discuss and apply goal setting through the application of the S.M.A.R.T. Method.   Other Matters of the Heart: - Provides group verbal, written materials and models to describe Stable Angina and Peripheral Artery. Includes description of the disease process and treatment options available to the cardiac patient.   Infection Prevention: - Provides verbal and written material to individual with discussion of infection control including proper hand washing and proper equipment cleaning during exercise session.   Cardiac Rehab from 05/15/2020 in [facility] Cardiac and Pulmonary Rehab  Date 05/15/20  Educator [facility]  Instruction Review Code 1- Verbalizes Understanding      Falls Prevention: - Provides verbal and written material to individual with discussion of falls prevention and safety.   Cardiac Rehab from 05/15/2020 in [facility] Cardiac and Pulmonary Rehab  Date 05/15/20  Educator [facility]  Instruction Review Code 1- Verbalizes Understanding      Other: -Provides group and verbal instruction on various topics (see comments)   Knowledge Questionnaire Score:  Knowledge Questionnaire Score - 05/15/20 1326      Knowledge Questionnaire Score   Pre Score 24/28 Education Focus: Angina, Nutrtition, Exercise           Core Components/Risk Factors/Patient Goals at Admission:  Personal Goals and Risk Factors at Admission - 05/15/20 1329      Core Components/Risk Factors/Patient Goals on Admission    Weight Management Yes;Obesity;Weight Loss    Intervention  Weight Management: Develop a combined nutrition and exercise program designed to reach desired caloric intake, while maintaining appropriate intake of nutrient and fiber, sodium and fats, and appropriate energy expenditure required for the weight goal.;Weight Management: Provide education and appropriate resources to help participant work on and attain dietary goals.;Weight Management/Obesity: Establish reasonable short term and long term weight goals.;Obesity: Provide education and appropriate resources to help participant work on and attain dietary goals.    Admit Weight 221 lb 4.8 oz (100.4 kg)    Goal Weight: Short Term 215 lb (97.5 kg)    Goal Weight: Long Term 200 lb (90.7 kg)    Expected Outcomes Short Term: Continue to assess and modify interventions until short term weight is achieved;Long Term: Adherence to nutrition and physical activity/exercise program aimed toward attainment of established weight goal;Weight Loss: Understanding of general recommendations for a balanced deficit meal plan, which promotes 1-2 lb weight loss per week and includes a negative energy balance of [phone] kcal/d;Understanding recommendations for meals to include 15-35% energy as protein, 25-35% energy from fat, 35-60% energy from carbohydrates, less than 254m of dietary cholesterol, 20-35 gm of total fiber daily;Understanding of distribution of calorie intake throughout the day with the consumption of 4-5 meals/snacks    Diabetes Yes    Intervention Provide education about signs/symptoms and action to take  for hypo/hyperglycemia.;Provide education about proper nutrition, including hydration, and aerobic/resistive exercise prescription along with prescribed medications to achieve blood glucose in normal ranges: Fasting glucose 65-99 mg/dL    Expected Outcomes Short Term: Participant verbalizes understanding of the signs/symptoms and immediate care of hyper/hypoglycemia, proper foot care and importance of medication,  aerobic/resistive exercise and nutrition plan for blood glucose control.;Long Term: Attainment of HbA1C < 7%.    Hypertension Yes    Intervention Provide education on lifestyle modifcations including regular physical activity/exercise, weight management, moderate sodium restriction and increased consumption of fresh fruit, vegetables, and low fat dairy, alcohol moderation, and smoking cessation.;Monitor prescription use compliance.    Expected Outcomes Short Term: Continued assessment and intervention until BP is < 140/49m HG in hypertensive participants. < 130/827mHG in hypertensive participants with diabetes, heart failure or chronic kidney disease.;Long Term: Maintenance of blood pressure at goal levels.    Lipids Yes    Intervention Provide education and support for participant on nutrition & aerobic/resistive exercise along with prescribed medications to achieve LDL <7016mHDL >88m65m  Expected Outcomes Short Term: Participant states understanding of desired cholesterol values and is compliant with medications prescribed. Participant is following exercise prescription and nutrition guidelines.;Long Term: Cholesterol controlled with medications as prescribed, with individualized exercise RX and with personalized nutrition plan. Value goals: LDL < 70mg60mL > 40 mg.           Education:Diabetes - Individual verbal and written instruction to review signs/symptoms of diabetes, desired ranges of glucose level fasting, after meals and with exercise. Acknowledge that pre and post exercise glucose checks will be done for 3 sessions at entry of program.   [facility]  Date 05/15/20  Educator [facility] Code 1- Verbalizes Understanding      Education: Know Your Numbers and Risk Factors: -Group verbal and written instruction about important numbers in your health.  Discussion of what are risk factors and how they play a role in the  disease process.  Review of Cholesterol, Blood Pressure, Diabetes, and BMI and the role they play in your overall health.   Core Components/Risk Factors/Patient Goals Review:    Core Components/Risk Factors/Patient Goals at Discharge (Final Review):    ITP Comments:  ITP Comments    Row Name 05/10/20 0949 05/15/20 1304         ITP Comments Completed virtual orientation today.  EP evaluation is scheduled for Tues 9/7 at 9am.  [facility] Completed 6MWT and gym orientation. Initial ITP created and sent for review to Dr. Mark Emily Filbertical Director.             Comments: Initial ITP

## 2020-05-15 NOTE — Patient Instructions (Signed)
Patient Instructions  Patient Details  Name: Reginald Murphy MRN: [id number] Date of Birth: 06/01/1956 Referring Provider:  Teodoro Spray, MD  Below are your personal goals for exercise, nutrition, and risk factors. Our goal is to help you stay on track towards obtaining and maintaining these goals. We will be discussing your progress on these goals with you throughout the program.  Initial Exercise Prescription:  Initial Exercise Prescription - 05/15/20 1300      Date of Initial Exercise RX and Referring Provider   Date 05/15/20    Referring Provider Bartholome Bill MD      Treadmill   MPH 2.3    Grade 1    Minutes 15    METs 3.08      Elliptical   Level 1    Speed 2.7    Minutes 15    METs 3      REL-XR   Level 3    Speed 50    Minutes 15    METs 3      Prescription Details   Frequency (times per week) 2    Duration Progress to 30 minutes of continuous aerobic without signs/symptoms of physical distress      Intensity   THRR 40-80% of Max Heartrate 115-144    Ratings of Perceived Exertion 11-13    Perceived Dyspnea 0-4      Progression   Progression Continue to progress workloads to maintain intensity without signs/symptoms of physical distress.      Resistance Training   Training Prescription Yes    Weight 3 lb    Reps 10-15           Exercise Goals: Frequency: Be able to perform aerobic exercise two to three times per week in program working toward 2-5 days per week of home exercise.  Intensity: Work with a perceived exertion of 11 (fairly light) - 15 (hard) while following your exercise prescription.  We will make changes to your prescription with you as you progress through the program.   Duration: Be able to do 30 to 45 minutes of continuous aerobic exercise in addition to a 5 minute warm-up and a 5 minute cool-down routine.   Nutrition Goals: Your personal nutrition goals will be established when you do your nutrition analysis with the  dietician.  The following are general nutrition guidelines to follow: Cholesterol < 200mg /day Sodium < 1500mg /day Fiber: Men over 50 yrs - 30 grams per day  Personal Goals:  Personal Goals and Risk Factors at Admission - 05/15/20 1329      Core Components/Risk Factors/Patient Goals on Admission    Weight Management Yes;Obesity;Weight Loss    Intervention Weight Management: Develop a combined nutrition and exercise program designed to reach desired caloric intake, while maintaining appropriate intake of nutrient and fiber, sodium and fats, and appropriate energy expenditure required for the weight goal.;Weight Management: Provide education and appropriate resources to help participant work on and attain dietary goals.;Weight Management/Obesity: Establish reasonable short term and long term weight goals.;Obesity: Provide education and appropriate resources to help participant work on and attain dietary goals.    Admit Weight 221 lb 4.8 oz (100.4 kg)    Goal Weight: Short Term 215 lb (97.5 kg)    Goal Weight: Long Term 200 lb (90.7 kg)    Expected Outcomes Short Term: Continue to assess and modify interventions until short term weight is achieved;Long Term: Adherence to nutrition and physical activity/exercise program aimed toward attainment of established weight goal;Weight Loss:  Understanding of general recommendations for a balanced deficit meal plan, which promotes 1-2 lb weight loss per week and includes a negative energy balance of [phone] kcal/d;Understanding recommendations for meals to include 15-35% energy as protein, 25-35% energy from fat, 35-60% energy from carbohydrates, less than 200mg  of dietary cholesterol, 20-35 gm of total fiber daily;Understanding of distribution of calorie intake throughout the day with the consumption of 4-5 meals/snacks    Diabetes Yes    Intervention Provide education about signs/symptoms and action to take for hypo/hyperglycemia.;Provide education about  proper nutrition, including hydration, and aerobic/resistive exercise prescription along with prescribed medications to achieve blood glucose in normal ranges: Fasting glucose 65-99 mg/dL    Expected Outcomes Short Term: Participant verbalizes understanding of the signs/symptoms and immediate care of hyper/hypoglycemia, proper foot care and importance of medication, aerobic/resistive exercise and nutrition plan for blood glucose control.;Long Term: Attainment of HbA1C < 7%.    Hypertension Yes    Intervention Provide education on lifestyle modifcations including regular physical activity/exercise, weight management, moderate sodium restriction and increased consumption of fresh fruit, vegetables, and low fat dairy, alcohol moderation, and smoking cessation.;Monitor prescription use compliance.    Expected Outcomes Short Term: Continued assessment and intervention until BP is < 140/77mm HG in hypertensive participants. < 130/74mm HG in hypertensive participants with diabetes, heart failure or chronic kidney disease.;Long Term: Maintenance of blood pressure at goal levels.    Lipids Yes    Intervention Provide education and support for participant on nutrition & aerobic/resistive exercise along with prescribed medications to achieve LDL 70mg , HDL >40mg .    Expected Outcomes Short Term: Participant states understanding of desired cholesterol values and is compliant with medications prescribed. Participant is following exercise prescription and nutrition guidelines.;Long Term: Cholesterol controlled with medications as prescribed, with individualized exercise RX and with personalized nutrition plan. Value goals: LDL < 70mg , HDL > 40 mg.           Tobacco Use Initial Evaluation: Social History   Tobacco Use  Smoking Status Never Smoker  Smokeless Tobacco Never Used    Exercise Goals and Review:  Exercise Goals    Row Name 05/15/20 1314             Exercise Goals   Increase Physical Activity  Yes       Intervention Provide advice, education, support and counseling about physical activity/exercise needs.;Develop an individualized exercise prescription for aerobic and resistive training based on initial evaluation findings, risk stratification, comorbidities and participant's personal goals.       Expected Outcomes Short Term: Attend rehab on a regular basis to increase amount of physical activity.;Long Term: Add in home exercise to make exercise part of routine and to increase amount of physical activity.;Long Term: Exercising regularly at least 3-5 days a week.       Increase Strength and Stamina Yes       Intervention Develop an individualized exercise prescription for aerobic and resistive training based on initial evaluation findings, risk stratification, comorbidities and participant's personal goals.;Provide advice, education, support and counseling about physical activity/exercise needs.       Expected Outcomes Short Term: Increase workloads from initial exercise prescription for resistance, speed, and METs.;Short Term: Perform resistance training exercises routinely during rehab and add in resistance training at home;Long Term: Improve cardiorespiratory fitness, muscular endurance and strength as measured by increased METs and functional capacity (6MWT)       Able to understand and use rate of perceived exertion (RPE) scale Yes  Intervention Provide education and explanation on how to use RPE scale       Expected Outcomes Short Term: Able to use RPE daily in rehab to express subjective intensity level;Long Term:  Able to use RPE to guide intensity level when exercising independently       Able to understand and use Dyspnea scale Yes       Intervention Provide education and explanation on how to use Dyspnea scale       Expected Outcomes Short Term: Able to use Dyspnea scale daily in rehab to express subjective sense of shortness of breath during exertion;Long Term: Able to use  Dyspnea scale to guide intensity level when exercising independently       Knowledge and understanding of Target Heart Rate Range (THRR) Yes       Intervention Provide education and explanation of THRR including how the numbers were predicted and where they are located for reference       Expected Outcomes Short Term: Able to state/look up THRR;Short Term: Able to use daily as guideline for intensity in rehab;Long Term: Able to use THRR to govern intensity when exercising independently       Able to check pulse independently Yes       Intervention Provide education and demonstration on how to check pulse in carotid and radial arteries.;Review the importance of being able to check your own pulse for safety during independent exercise       Expected Outcomes Short Term: Able to explain why pulse checking is important during independent exercise;Long Term: Able to check pulse independently and accurately       Understanding of Exercise Prescription Yes       Intervention Provide education, explanation, and written materials on patient's individual exercise prescription       Expected Outcomes Short Term: Able to explain program exercise prescription;Long Term: Able to explain home exercise prescription to exercise independently              Copy of goals given to participant.

## 2020-05-16 ENCOUNTER — Encounter: Payer: Self-pay | Admitting: *Deleted

## 2020-05-16 DIAGNOSIS — Z951 Presence of aortocoronary bypass graft: Secondary | ICD-10-CM

## 2020-05-16 NOTE — Progress Notes (Signed)
Cardiac Individual Treatment Plan  Patient Details  Name: Reginald Murphy MRN: [id number] Date of Birth: 08-25-1956 Referring Provider:     Cardiac Rehab from 05/15/2020 in [facility] Cardiac and Pulmonary Rehab  Referring Provider Bartholome Bill MD      Initial Encounter Date:    Cardiac Rehab from 05/15/2020 in [facility] Cardiac and Pulmonary Rehab  Date 05/15/20      Visit Diagnosis: S/P CABG x 3  Patient's Home Medications on Admission:  Current Outpatient Medications:  .  ACCU-CHEK GUIDE test strip, Please specify directions, refills and quantity, Disp: 100 each, Rfl: 11 .  albuterol (VENTOLIN HFA) 108 (90 Base) MCG/ACT inhaler, TAKE 2 PUFFS BY MOUTH EVERY 6 HOURS AS NEEDED FOR WHEEZE OR SHORTNESS OF BREATH (Patient not taking: Reported on 05/02/2020), Disp: 18 g, Rfl: 2 .  amLODipine (NORVASC) 5 MG tablet, TAKE 1 TABLET BY MOUTH EVERY DAY (Patient not taking: Reported on 05/02/2020), Disp: 90 tablet, Rfl: 0 .  aspirin EC 81 MG tablet, Take 81 mg by mouth daily. , Disp: , Rfl:  .  atorvastatin (LIPITOR) 40 MG tablet, Take 40 mg by mouth at bedtime., Disp: , Rfl:  .  Blood Glucose Monitoring Suppl (ACCU-CHEK NANO SMARTVIEW) w/Device KIT, 1 strip by Does not apply route daily., Disp: 1 kit, Rfl: 0 .  glipiZIDE (GLUCOTROL XL) 10 MG 24 hr tablet, Take by mouth. (Patient not taking: Reported on 05/10/2020), Disp: , Rfl:  .  glucose blood test strip, 1 each by Other route as needed for other. Use as instructed, Disp: , Rfl:  .  lisinopril (ZESTRIL) 40 MG tablet, TAKE 1 TABLET (40 MG TOTAL) BY MOUTH DAILY. (Patient not taking: Reported on 05/02/2020), Disp: 90 tablet, Rfl: 3 .  metFORMIN (GLUCOPHAGE) 1000 MG tablet, TAKE 1 TABLET BY MOUTH 2 TIMES DAILY WITH A MEAL., Disp: 180 tablet, Rfl: 0 .  metoprolol tartrate (LOPRESSOR) 25 MG tablet, Take by mouth., Disp: , Rfl:  .  nitroGLYCERIN (NITROSTAT) 0.4 MG SL tablet, SMARTSIG:1 Tablet(s) Sublingual PRN, Disp: , Rfl:  .  ONETOUCH DELICA LANCETS 37T MISC, 2  (two) times daily. for testing, Disp: , Rfl: 12 .  SYMBICORT 80-4.5 MCG/ACT inhaler, TAKE 2 PUFFS BY MOUTH TWICE A DAY (Patient not taking: Reported on 05/02/2020), Disp: 30.6 Inhaler, Rfl: 1 .  tadalafil (CIALIS) 20 MG tablet, Take 1 tablet (20 mg total) by mouth every three (3) days as needed for erectile dysfunction., Disp: 30 tablet, Rfl: 5 .  TRULICITY 0.24 OX/7.3ZH SOPN, SMARTSIG:0.5 Milliliter(s) SUB-Q Once a Week, Disp: 12 mL, Rfl: 3  Past Medical History: Past Medical History:  Diagnosis Date  . Diabetes mellitus without complication ([place])   . Hypertension     Tobacco Use: Social History   Tobacco Use  Smoking Status Never Smoker  Smokeless Tobacco Never Used    Labs: Recent Review Flowsheet Data    Labs for ITP Cardiac and Pulmonary Rehab Latest Ref Rng & Units 07/01/2018 10/11/2018 03/03/2019 07/18/2019 11/14/2019   Cholestrol 100 - 199 mg/dL - - 176 - -   LDLCALC 0 - 99 mg/dL - - 80 - -   HDL >39 mg/dL - - 30(L) - -   Trlycerides 0 - 149 mg/dL - - 329(H) - -   Hemoglobin A1c 4.0 - 5.6 % 7.7(A) 7.5(A) 7.0(A) 7.0(A) 7.3(A)       Exercise Target Goals: Exercise Program Goal: Individual exercise prescription set using results from initial 6 min walk test and THRR while considering  patient's  activity barriers and safety.   Exercise Prescription Goal: Initial exercise prescription builds to 30-45 minutes a day of aerobic activity, 2-3 days per week.  Home exercise guidelines will be given to patient during program as part of exercise prescription that the participant will acknowledge.   Education: Aerobic Exercise & Resistance Training: - Gives group verbal and written instruction on the various components of exercise. Focuses on aerobic and resistive training programs and the benefits of this training and how to safely progress through these programs..   Education: Exercise & Equipment Safety: - Individual verbal instruction and demonstration of equipment use and safety  with use of the equipment.   Cardiac Rehab from 05/15/2020 in [facility] Cardiac and Pulmonary Rehab  Date 05/15/20  Educator [facility]  Instruction Review Code 1- Verbalizes Understanding      Education: Exercise Physiology & General Exercise Guidelines: - Group verbal and written instruction with models to review the exercise physiology of the cardiovascular system and associated critical values. Provides general exercise guidelines with specific guidelines to those with heart or lung disease.    Cardiac Rehab from 05/15/2020 in [facility] Cardiac and Pulmonary Rehab  Date 05/15/20  Instruction Review Code 3- Needs Reinforcement  [need identified]      Education: Flexibility, Balance, Mind/Body Relaxation: Provides group verbal/written instruction on the benefits of flexibility and balance training, including mind/body exercise modes such as yoga, pilates and tai chi.  Demonstration and skill practice provided.   Activity Barriers & Risk Stratification:  Activity Barriers & Cardiac Risk Stratification - 05/15/20 1310      Activity Barriers & Cardiac Risk Stratification   Activity Barriers Deconditioning;Muscular Weakness;Balance Concerns    Cardiac Risk Stratification High           6 Minute Walk:  6 Minute Walk    Row Name 05/15/20 1307         6 Minute Walk   Phase Initial     Distance 1235 feet     Walk Time 6 minutes     # of Rest Breaks 0     MPH 2.34     METS 3.1     RPE 8     VO2 Peak 10.84     Symptoms No     Resting HR 86 bpm     Resting BP 124/62     Resting Oxygen Saturation  98 %     Exercise Oxygen Saturation  during 6 min walk 99 %     Max Ex. HR 114 bpm     Max Ex. BP 130/64     2 Minute Post BP 134/70            Oxygen Initial Assessment:   Oxygen Re-Evaluation:   Oxygen Discharge (Final Oxygen Re-Evaluation):   Initial Exercise Prescription:  Initial Exercise Prescription - 05/15/20 1300      Date of Initial Exercise RX and Referring Provider   Date  05/15/20    Referring Provider Bartholome Bill MD      Treadmill   MPH 2.3    Grade 1    Minutes 15    METs 3.08      Elliptical   Level 1    Speed 2.7    Minutes 15    METs 3      REL-XR   Level 3    Speed 50    Minutes 15    METs 3      Prescription Details   Frequency (times per  week) 2    Duration Progress to 30 minutes of continuous aerobic without signs/symptoms of physical distress      Intensity   THRR 40-80% of Max Heartrate 115-144    Ratings of Perceived Exertion 11-13    Perceived Dyspnea 0-4      Progression   Progression Continue to progress workloads to maintain intensity without signs/symptoms of physical distress.      Resistance Training   Training Prescription Yes    Weight 3 lb    Reps 10-15           Perform Capillary Blood Glucose checks as needed.  Exercise Prescription Changes:  Exercise Prescription Changes    Row Name 05/15/20 1300             Response to Exercise   Blood Pressure (Admit) 124/62       Blood Pressure (Exercise) 130/64       Blood Pressure (Exit) 134/70       Heart Rate (Admit) 86 bpm       Heart Rate (Exercise) 114 bpm       Heart Rate (Exit) 87 bpm       Oxygen Saturation (Admit) 98 %       Oxygen Saturation (Exercise) 99 %       Rating of Perceived Exertion (Exercise) 8       Symptoms none       Comments walk test results              Exercise Comments:   Exercise Goals and Review:  Exercise Goals    Row Name 05/15/20 1314             Exercise Goals   Increase Physical Activity Yes       Intervention Provide advice, education, support and counseling about physical activity/exercise needs.;Develop an individualized exercise prescription for aerobic and resistive training based on initial evaluation findings, risk stratification, comorbidities and participant's personal goals.       Expected Outcomes Short Term: Attend rehab on a regular basis to increase amount of physical activity.;Long Term:  Add in home exercise to make exercise part of routine and to increase amount of physical activity.;Long Term: Exercising regularly at least 3-5 days a week.       Increase Strength and Stamina Yes       Intervention Develop an individualized exercise prescription for aerobic and resistive training based on initial evaluation findings, risk stratification, comorbidities and participant's personal goals.;Provide advice, education, support and counseling about physical activity/exercise needs.       Expected Outcomes Short Term: Increase workloads from initial exercise prescription for resistance, speed, and METs.;Short Term: Perform resistance training exercises routinely during rehab and add in resistance training at home;Long Term: Improve cardiorespiratory fitness, muscular endurance and strength as measured by increased METs and functional capacity (6MWT)       Able to understand and use rate of perceived exertion (RPE) scale Yes       Intervention Provide education and explanation on how to use RPE scale       Expected Outcomes Short Term: Able to use RPE daily in rehab to express subjective intensity level;Long Term:  Able to use RPE to guide intensity level when exercising independently       Able to understand and use Dyspnea scale Yes       Intervention Provide education and explanation on how to use Dyspnea scale       Expected Outcomes Short Term:  Able to use Dyspnea scale daily in rehab to express subjective sense of shortness of breath during exertion;Long Term: Able to use Dyspnea scale to guide intensity level when exercising independently       Knowledge and understanding of Target Heart Rate Range (THRR) Yes       Intervention Provide education and explanation of THRR including how the numbers were predicted and where they are located for reference       Expected Outcomes Short Term: Able to state/look up THRR;Short Term: Able to use daily as guideline for intensity in rehab;Long Term:  Able to use THRR to govern intensity when exercising independently       Able to check pulse independently Yes       Intervention Provide education and demonstration on how to check pulse in carotid and radial arteries.;Review the importance of being able to check your own pulse for safety during independent exercise       Expected Outcomes Short Term: Able to explain why pulse checking is important during independent exercise;Long Term: Able to check pulse independently and accurately       Understanding of Exercise Prescription Yes       Intervention Provide education, explanation, and written materials on patient's individual exercise prescription       Expected Outcomes Short Term: Able to explain program exercise prescription;Long Term: Able to explain home exercise prescription to exercise independently              Exercise Goals Re-Evaluation :   Discharge Exercise Prescription (Final Exercise Prescription Changes):  Exercise Prescription Changes - 05/15/20 1300      Response to Exercise   Blood Pressure (Admit) 124/62    Blood Pressure (Exercise) 130/64    Blood Pressure (Exit) 134/70    Heart Rate (Admit) 86 bpm    Heart Rate (Exercise) 114 bpm    Heart Rate (Exit) 87 bpm    Oxygen Saturation (Admit) 98 %    Oxygen Saturation (Exercise) 99 %    Rating of Perceived Exertion (Exercise) 8    Symptoms none    Comments walk test results           Nutrition:  Target Goals: Understanding of nutrition guidelines, daily intake of sodium <1553m, cholesterol <2066m calories 30% from fat and 7% or less from saturated fats, daily to have 5 or more servings of fruits and vegetables.  Education: Controlling Sodium/Reading Food Labels -Group verbal and written material supporting the discussion of sodium use in heart healthy nutrition. Review and explanation with models, verbal and written materials for utilization of the food label.   Education: General Nutrition  Guidelines/Fats and Fiber: -Group instruction provided by verbal, written material, models and posters to present the general guidelines for heart healthy nutrition. Gives an explanation and review of dietary fats and fiber.   [facility]  [facility]  Instruction Review Code 3- Needs Reinforcement  [need identified]      Biometrics:  Pre Biometrics - 05/15/20 1314      Pre Biometrics   Height 5' 9.5" (1.765 m)    Weight 221 lb 4.8 oz (100.4 kg)    BMI (Calculated) 32.22    Single Leg Stand 4.72 seconds            Nutrition Therapy Plan and Nutrition Goals:   Nutrition Assessments:  Nutrition Assessments - 05/15/20 1326      MEDFICTS Scores   Pre Score 74  MEDIFICTS Score Key:          ?70 Need to make dietary changes          40-70 Heart Healthy Diet         ? 40 Therapeutic Level Cholesterol Diet  Nutrition Goals Re-Evaluation:   Nutrition Goals Discharge (Final Nutrition Goals Re-Evaluation):   Psychosocial: Target Goals: Acknowledge presence or absence of significant depression and/or stress, maximize coping skills, provide positive support system. Participant is able to verbalize types and ability to use techniques and skills needed for reducing stress and depression.   Education: Depression - Provides group verbal and written instruction on the correlation between heart/lung disease and depressed mood, treatment options, and the stigmas associated with seeking treatment.   Education: Sleep Hygiene -Provides group verbal and written instruction about how sleep can affect your health.  Define sleep hygiene, discuss sleep cycles and impact of sleep habits. Review good sleep hygiene tips.     Education: Stress and Anxiety: - Provides group verbal and written instruction about the health risks of elevated stress and causes of high stress.  Discuss the correlation between heart/lung disease and  anxiety and treatment options. Review healthy ways to manage with stress and anxiety.    Initial Review & Psychosocial Screening:  Initial Psych Review & Screening - 05/10/20 0939      Initial Review   Current issues with Current Stress Concerns    Source of Stress Concerns Family    Comments mother has dementia (living at [place])      [place]? Yes   wife, cousins that live near by (he's an only child)     Barriers   Psychosocial barriers to participate in program There are no identifiable barriers or psychosocial needs.      Screening Interventions   Interventions Encouraged to exercise;To provide support and resources with identified psychosocial needs;Provide feedback about the scores to participant    Expected Outcomes Short Term goal: Utilizing psychosocial counselor, staff and physician to assist with identification of specific Stressors or current issues interfering with healing process. Setting desired goal for each stressor or current issue identified.;Long Term Goal: Stressors or current issues are controlled or eliminated.;Short Term goal: Identification and review with participant of any Quality of Life or Depression concerns found by scoring the questionnaire.;Long Term goal: The participant improves quality of Life and PHQ9 Scores as seen by post scores and/or verbalization of changes           Quality of Life Scores:   Quality of Life - 05/15/20 1326      Quality of Life   Select Quality of Life      Quality of Life Scores   Health/Function Pre 29.57 %    Socioeconomic Pre 30 %    Psych/Spiritual Pre 30 %    Family Pre 30 %    GLOBAL Pre 29.82 %          Scores of 19 and below usually indicate a poorer quality of life in these areas.  A difference of  2-3 points is a clinically meaningful difference.  A difference of 2-3 points in the total score of the Quality of Life Index has been associated with significant improvement in  overall quality of life, self-image, physical symptoms, and general health in studies assessing change in quality of life.  PHQ-9: Recent Review Flowsheet Data    Depression screen [facility] 2/9 05/15/2020 07/18/2019 04/01/2017 04/01/2017   Decreased Interest  0 0 0 0   Down, Depressed, Hopeless 0 0 0 0   PHQ - 2 Score 0 0 0 0   Altered sleeping 0 - 2 -   Tired, decreased energy 0 - 1 -   Change in appetite 0 - 0 -   Feeling bad or failure about yourself  0 - 0 -   Trouble concentrating 0 - 0 -   Moving slowly or fidgety/restless 0 - 0 -   Suicidal thoughts 0 - 0 -   PHQ-9 Score 0 - 3 -   Difficult doing work/chores Not difficult at all - - -     Interpretation of Total Score  Total Score Depression Severity:  1-4 = Minimal depression, 5-9 = Mild depression, 10-14 = Moderate depression, 15-19 = Moderately severe depression, 20-27 = Severe depression   Psychosocial Evaluation and Intervention:  Psychosocial Evaluation - 05/10/20 0946      Psychosocial Evaluation & Interventions   Interventions Encouraged to exercise with the program and follow exercise prescription    Comments 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  He is a good spirited man. He is an only child but has a great support system with his wife and cousins that live nearby.  His mother is now living in [facility] with dementia, which got worse while he was having to undergo surgery.  Overall, he is doing very well and eager to get going again.  He wants to get back to his normal and be able to walk on the beach again without having to stop to catch his breath.  He sleeps well at night and has no other major stressors currently.    Expected Outcomes Short: Attend rehab to build stamina  Long: Continue to cope well with recovery    Continue Psychosocial Services  Follow up required by staff           Psychosocial Re-Evaluation:   Psychosocial Discharge (Final Psychosocial Re-Evaluation):   Vocational  Rehabilitation: Provide vocational rehab assistance to qualifying candidates.   Vocational Rehab Evaluation & Intervention:  Vocational Rehab - 05/10/20 3704      Initial Vocational Rehab Evaluation & Intervention   Assessment shows need for Vocational Rehabilitation No   retired          Education: Education Goals: Education classes will be provided on a variety of topics geared toward better understanding of heart health and risk factor modification. Participant will state understanding/return demonstration of topics presented as noted by education test scores.  Learning Barriers/Preferences:  Learning Barriers/Preferences - 05/10/20 8889      Learning Barriers/Preferences   Learning Barriers Sight   glasses for reading   Learning Preferences None           General Cardiac Education Topics:  AED/CPR: - Group verbal and written instruction with the use of models to demonstrate the basic use of the AED with the basic ABC's of resuscitation.   Anatomy & Physiology of the Heart: - Group verbal and written instruction and models provide basic cardiac anatomy and physiology, with the coronary electrical and arterial systems. Review of Valvular disease and Heart Failure   Cardiac Rehab from 05/15/[facility] in [facility] Cardiac and Pulmonary Rehab  Date 05/15/20  Instruction Review Code 3- Needs Reinforcement  [need identified]      Cardiac Procedures: - Group verbal and written instruction to review commonly prescribed medications for heart disease. Reviews the medication, class of the drug, and side effects. Includes the  steps to properly store meds and maintain the prescription regimen. (beta blockers and nitrates)   Cardiac Medications I: - Group verbal and written instruction to review commonly prescribed medications for heart disease. Reviews the medication, class of the drug, and side effects. Includes the steps to properly store meds and maintain the prescription  regimen.   Cardiac Medications II: -Group verbal and written instruction to review commonly prescribed medications for heart disease. Reviews the medication, class of the drug, and side effects. (all other drug classes)    Go Sex-Intimacy & Heart Disease, Get SMART - Goal Setting: - Group verbal and written instruction through game format to discuss heart disease and the return to sexual intimacy. Provides group verbal and written material to discuss and apply goal setting through the application of the S.M.A.R.T. Method.   Other Matters of the Heart: - Provides group verbal, written materials and models to describe Stable Angina and Peripheral Artery. Includes description of the disease process and treatment options available to the cardiac patient.   Infection Prevention: - Provides verbal and written material to individual with discussion of infection control including proper hand washing and proper equipment cleaning during exercise session.   Cardiac Rehab from 05/15/2020 in [facility] Cardiac and Pulmonary Rehab  Date 05/15/20  Educator [facility]  Instruction Review Code 1- Verbalizes Understanding      Falls Prevention: - Provides verbal and written material to individual with discussion of falls prevention and safety.   Cardiac Rehab from 05/15/2020 in [facility] Cardiac and Pulmonary Rehab  Date 05/15/20  Educator [facility]  Instruction Review Code 1- Verbalizes Understanding      Other: -Provides group and verbal instruction on various topics (see comments)   Knowledge Questionnaire Score:  Knowledge Questionnaire Score - 05/15/20 1326      Knowledge Questionnaire Score   Pre Score 24/28 Education Focus: Angina, Nutrtition, Exercise           Core Components/Risk Factors/Patient Goals at Admission:  Personal Goals and Risk Factors at Admission - 05/15/20 1329      Core Components/Risk Factors/Patient Goals on Admission    Weight Management Yes;Obesity;Weight Loss    Intervention  Weight Management: Develop a combined nutrition and exercise program designed to reach desired caloric intake, while maintaining appropriate intake of nutrient and fiber, sodium and fats, and appropriate energy expenditure required for the weight goal.;Weight Management: Provide education and appropriate resources to help participant work on and attain dietary goals.;Weight Management/Obesity: Establish reasonable short term and long term weight goals.;Obesity: Provide education and appropriate resources to help participant work on and attain dietary goals.    Admit Weight 221 lb 4.8 oz (100.4 kg)    Goal Weight: Short Term 215 lb (97.5 kg)    Goal Weight: Long Term 200 lb (90.7 kg)    Expected Outcomes Short Term: Continue to assess and modify interventions until short term weight is achieved;Long Term: Adherence to nutrition and physical activity/exercise program aimed toward attainment of established weight goal;Weight Loss: Understanding of general recommendations for a balanced deficit meal plan, which promotes 1-2 lb weight loss per week and includes a negative energy balance of [phone] kcal/d;Understanding recommendations for meals to include 15-35% energy as protein, 25-35% energy from fat, 35-60% energy from carbohydrates, less than 235m of dietary cholesterol, 20-35 gm of total fiber daily;Understanding of distribution of calorie intake throughout the day with the consumption of 4-5 meals/snacks    Diabetes Yes    Intervention Provide education about signs/symptoms and action to take  for hypo/hyperglycemia.;Provide education about proper nutrition, including hydration, and aerobic/resistive exercise prescription along with prescribed medications to achieve blood glucose in normal ranges: Fasting glucose 65-99 mg/dL    Expected Outcomes Short Term: Participant verbalizes understanding of the signs/symptoms and immediate care of hyper/hypoglycemia, proper foot care and importance of medication,  aerobic/resistive exercise and nutrition plan for blood glucose control.;Long Term: Attainment of HbA1C < 7%.    Hypertension Yes    Intervention Provide education on lifestyle modifcations including regular physical activity/exercise, weight management, moderate sodium restriction and increased consumption of fresh fruit, vegetables, and low fat dairy, alcohol moderation, and smoking cessation.;Monitor prescription use compliance.    Expected Outcomes Short Term: Continued assessment and intervention until BP is < 140/65m HG in hypertensive participants. < 130/83mHG in hypertensive participants with diabetes, heart failure or chronic kidney disease.;Long Term: Maintenance of blood pressure at goal levels.    Lipids Yes    Intervention Provide education and support for participant on nutrition & aerobic/resistive exercise along with prescribed medications to achieve LDL <7011mHDL >76m69m  Expected Outcomes Short Term: Participant states understanding of desired cholesterol values and is compliant with medications prescribed. Participant is following exercise prescription and nutrition guidelines.;Long Term: Cholesterol controlled with medications as prescribed, with individualized exercise RX and with personalized nutrition plan. Value goals: LDL < 70mg30mL > 40 mg.           Education:Diabetes - Individual verbal and written instruction to review signs/symptoms of diabetes, desired ranges of glucose level fasting, after meals and with exercise. Acknowledge that pre and post exercise glucose checks will be done for 3 sessions at entry of program.   [facility]  Date 05/15/20  Educator J[facility]  [facility] Review Code 1- Verbalizes Understanding      Education: Know Your Numbers and Risk Factors: -Group verbal and written instruction about important numbers in your health.  Discussion of what are risk factors and how they play a role in the  disease process.  Review of Cholesterol, Blood Pressure, Diabetes, and BMI and the role they play in your overall health.   Core Components/Risk Factors/Patient Goals Review:    Core Components/Risk Factors/Patient Goals at Discharge (Final Review):    ITP Comments:  ITP Comments    Row Name 05/10/20 0949 05/15/20 1304 05/16/20 1624       ITP Comments Completed virtual orientation today.  EP evaluation is scheduled for Tues 9/7 at 9am.  [facility] Completed 6MWT and gym orientation. Initial ITP created and sent for review to Dr. Mark Emily Filbertical Director. 30 day review completed. ITP sent to Dr. Mark Emily Filbertical Director of Cardiac and Pulmonary Rehab. Continue with ITP unless changes are made by physician.            Comments: 30 day review

## 2020-05-17 ENCOUNTER — Other Ambulatory Visit: Payer: Self-pay

## 2020-05-17 ENCOUNTER — Encounter: Payer: 59 | Admitting: *Deleted

## 2020-05-17 DIAGNOSIS — Z951 Presence of aortocoronary bypass graft: Secondary | ICD-10-CM | POA: Diagnosis not present

## 2020-05-17 LAB — GLUCOSE, CAPILLARY
Glucose-Capillary: 164 mg/dL — ABNORMAL HIGH (ref 70–99)
Glucose-Capillary: 117 mg/dL — ABNORMAL HIGH (ref 70–99)

## 2020-05-17 NOTE — Progress Notes (Signed)
Daily Session Note  Patient Details  Name: Reginald Murphy MRN: [id number] Date of Birth: [birth year] Referring Provider:     Cardiac Rehab from 05/15/2020 in [facility] Cardiac and Pulmonary Rehab  Referring Provider Bartholome Bill MD      Encounter Date: 05/17/2020  Check In:  Session Check In - 05/17/20 0819      Check-In   Supervising physician immediately available to respond to emergencies See telemetry face sheet for immediately available ER MD    Location [facility]-Cardiac & Pulmonary Rehab    Staff Present Heath Lark, RN, BSN, Jacklynn Bue, MS Exercise Physiologist;Amanda Oletta Darter, [place], ACSM CEP, Exercise Physiologist    Virtual Visit No    Medication changes reported     No    Fall or balance concerns reported    No    Warm-up and Cool-down Performed on first and last piece of equipment    Resistance Training Performed Yes    VAD Patient? No    PAD/SET Patient? No      Pain Assessment   Currently in Pain? No/denies              Social History   Tobacco Use  Smoking Status Never Smoker  Smokeless Tobacco Never Used    Goals Met:  Exercise tolerated well Personal goals reviewed No report of cardiac concerns or symptoms  Goals Unmet:  Not Applicable  Comments: First full day of exercise!  Patient was oriented to gym and equipment including functions, settings, policies, and procedures.  Patient's individual exercise prescription and treatment plan were reviewed.  All starting workloads were established based on the results of the 6 minute walk test done at initial orientation visit.  The plan for exercise progression was also introduced and progression will be customized based on patient's performance and goals.    Dr. Emily Filbert is Medical Director for [place] and [facility].

## 2020-05-23 ENCOUNTER — Encounter: Payer: 59 | Admitting: *Deleted

## 2020-05-23 ENCOUNTER — Other Ambulatory Visit: Payer: Self-pay

## 2020-05-23 DIAGNOSIS — Z951 Presence of aortocoronary bypass graft: Secondary | ICD-10-CM

## 2020-05-23 LAB — GLUCOSE, CAPILLARY
Glucose-Capillary: 155 mg/dL — ABNORMAL HIGH (ref 70–99)
Glucose-Capillary: 101 mg/dL — ABNORMAL HIGH (ref 70–99)

## 2020-05-23 LAB — POCT GLYCOSYLATED HEMOGLOBIN (HGB A1C)
Estimated Average Glucose: 123
Hemoglobin A1C: 5.9 % — AB (ref 4.0–5.6)

## 2020-05-23 NOTE — Progress Notes (Signed)
Daily Session Note  Patient Details  Name: Reginald Murphy MRN: [id number] Date of Birth: 06/09/1956 Referring Provider:     Cardiac Rehab from 05/15/2020 in [facility] Cardiac and Pulmonary Rehab  Referring Provider Bartholome Bill MD      Encounter Date: 05/23/2020  Check In:  Session Check In - 05/23/20 0734      Check-In   Supervising physician immediately available to respond to emergencies See telemetry face sheet for immediately available ER MD    Location [facility]-Cardiac & Pulmonary Rehab    Staff Present Nada Maclachlan, BA, ACSM CEP, Exercise Physiologist;Krista Frederico Hamman, RN BSN    Virtual Visit No    Medication changes reported     No    Fall or balance concerns reported    No    Warm-up and Cool-down Performed on first and last piece of equipment    Resistance Training Performed Yes    VAD Patient? No    PAD/SET Patient? No      Pain Assessment   Currently in Pain? No/denies              Social History   Tobacco Use  Smoking Status Never Smoker  Smokeless Tobacco Never Used    Goals Met:  Independence with exercise equipment Exercise tolerated well No report of cardiac concerns or symptoms Strength training completed today  Goals Unmet:  Not Applicable  Comments: Pt able to follow exercise prescription today without complaint.  Will continue to monitor for progression. Reviewed home exercise with pt today.  Pt plans to use stationary bike and elliptical at home for exercise.  He will also continue to use his weights and walk when at beach too.  Reviewed THR, pulse, RPE, sign and symptoms, pulse oximetery and when to call 911 or MD.  Also discussed weather considerations and indoor options.  Pt voiced understanding.    Dr. Emily Filbert is Medical Director for [place] and [facility].

## 2020-05-29 ENCOUNTER — Other Ambulatory Visit: Payer: Self-pay

## 2020-05-29 ENCOUNTER — Encounter: Payer: 59 | Admitting: *Deleted

## 2020-05-29 DIAGNOSIS — Z951 Presence of aortocoronary bypass graft: Secondary | ICD-10-CM

## 2020-05-29 LAB — GLUCOSE, CAPILLARY
Glucose-Capillary: 146 mg/dL — ABNORMAL HIGH (ref 70–99)
Glucose-Capillary: 120 mg/dL — ABNORMAL HIGH (ref 70–99)

## 2020-05-29 NOTE — Progress Notes (Signed)
Daily Session Note  Patient Details  Name: Reginald Murphy MRN: [id number] Date of Birth: April 26, 1956 Referring Provider:     Cardiac Rehab from 05/15/2020 in [facility] Cardiac and Pulmonary Rehab  Referring Provider Bartholome Bill MD      Encounter Date: 05/29/2020  Check In:  Session Check In - 05/29/20 0833      Check-In   Supervising physician immediately available to respond to emergencies See telemetry face sheet for immediately available ER MD    Location [facility]-Cardiac & Pulmonary Rehab    Staff Present Heath Lark, RN, BSN, Lance Sell, BA, ACSM CEP, Exercise Physiologist;Kara Eliezer Bottom, MS Exercise Physiologist    Virtual Visit No    Medication changes reported     No    Fall or balance concerns reported    No    Warm-up and Cool-down Performed on first and last piece of equipment    Resistance Training Performed Yes    VAD Patient? No    PAD/SET Patient? No      Pain Assessment   Currently in Pain? No/denies              Social History   Tobacco Use  Smoking Status Never Smoker  Smokeless Tobacco Never Used    Goals Met:  Independence with exercise equipment Exercise tolerated well No report of cardiac concerns or symptoms  Goals Unmet:  Not Applicable  Comments: Pt able to follow exercise prescription today without complaint.  Will continue to monitor for progression.    Dr. Emily Filbert is Medical Director for [place] and [facility].

## 2020-05-31 ENCOUNTER — Encounter: Payer: 59 | Admitting: *Deleted

## 2020-05-31 ENCOUNTER — Other Ambulatory Visit: Payer: Self-pay

## 2020-05-31 DIAGNOSIS — Z951 Presence of aortocoronary bypass graft: Secondary | ICD-10-CM

## 2020-05-31 NOTE — Progress Notes (Signed)
Daily Session Note  Patient Details  Name: Reginald Murphy MRN: [id number] Date of Birth: [birth year] Referring Provider:     Cardiac Rehab from 05/15/2020 in [facility] Cardiac and Pulmonary Rehab  Referring Provider Bartholome Bill MD      Encounter Date: 05/31/2020  Check In:  Session Check In - 05/31/20 0739      Check-In   Supervising physician immediately available to respond to emergencies See telemetry face sheet for immediately available ER MD    Location [facility]-Card[facility] & Pul[facility] Rehab    Staff Present Heath Lark, RN, BSN, CCRP;Amanda Sommer, BA, ACSM CEP, Exercise Physiologist;Melissa Caiola RDN, LDN    Virtual Visit No    Medication changes reported     No    Fall or balance concerns reported    No    Warm-up and Cool-down Performed on first and last piece of equipment    Resistance Training Performed Yes    VAD Patient? No    PAD/SET Patient? No      Pain Assessment   Currently in Pain? No/denies              Social History   Tobacco Use  Smoking Status Never Smoker  Smokeless Tobacco Never Used    Goals Met:  Independence with exercise equipment Exercise tolerated well No report of cardiac concerns or symptoms  Goals Unmet:  Not Applicable  Comments: Pt able to follow exercise prescription today without complaint.  Will continue to monitor for progression.    Dr. Emily Filbert is Medical Director for [place] and [facility].

## 2020-06-05 ENCOUNTER — Other Ambulatory Visit: Payer: Self-pay

## 2020-06-05 ENCOUNTER — Encounter: Payer: 59 | Admitting: *Deleted

## 2020-06-05 DIAGNOSIS — Z951 Presence of aortocoronary bypass graft: Secondary | ICD-10-CM | POA: Diagnosis not present

## 2020-06-05 NOTE — Progress Notes (Signed)
Daily Session Note  Patient Details  Name: Reginald Murphy MRN: [id number] Date of Birth: 04-13-1956 Referring Provider:     Cardiac Rehab from 05/15/2020 in [facility] Cardiac and Pulmonary Rehab  Referring Provider Bartholome Bill MD      Encounter Date: 06/05/2020  Check In:      Social History   Tobacco Use  Smoking Status Never Smoker  Smokeless Tobacco Never Used    Goals Met:  Independence with exercise equipment Exercise tolerated well Strength training completed today  Goals Unmet:  Not Applicable  Comments: Pt able to follow exercise prescription today without complaint.  Will continue to monitor for progression.    Dr. Emily Filbert is Medical Director for [place] and [facility].

## 2020-06-05 NOTE — Progress Notes (Signed)
Daily Session Note  Patient Details  Name: ROE WILNER MRN: [id number] Date of Birth: 13-Feb-1956 Referring Provider:     Cardiac Rehab from 05/15/2020 in [facility] Cardiac and Pulmonary Rehab  Referring Provider Bartholome Bill MD      Encounter Date: 06/05/2020  Check In:  Session Check In - 06/05/20 0835      Check-In   Supervising physician immediately available to respond to emergencies See telemetry face sheet for immediately available ER MD    Location [facility]-Cardiac & Pulmonary Rehab    Virtual Visit No    Medication changes reported     No    Fall or balance concerns reported    No    Warm-up and Cool-down Performed on first and last piece of equipment    Resistance Training Performed Yes    VAD Patient? No    PAD/SET Patient? No      Pain Assessment   Currently in Pain? No/denies              Social History   Tobacco Use  Smoking Status Never Smoker  Smokeless Tobacco Never Used    Goals Met:  Independence with exercise equipment Exercise tolerated well No report of cardiac concerns or symptoms  Goals Unmet:  Not Applicable  Comments: Pt able to follow exercise prescription today without complaint.  Will continue to monitor for progression.    Dr. Emily Filbert is Medical Director for [place] and [facility].

## 2020-06-07 ENCOUNTER — Encounter: Payer: 59 | Admitting: *Deleted

## 2020-06-07 ENCOUNTER — Other Ambulatory Visit: Payer: Self-pay

## 2020-06-07 DIAGNOSIS — Z951 Presence of aortocoronary bypass graft: Secondary | ICD-10-CM

## 2020-06-07 NOTE — Progress Notes (Signed)
Daily Session Note  Patient Details  Name: Reginald Murphy MRN: [id number] Date of Birth: [birth year] Referring Provider:     Cardiac Rehab from 05/15/2020 in [facility] Cardiac and Pulmonary Rehab  Referring Provider Bartholome Bill MD      Encounter Date: 06/07/2020  Check In:  Session Check In - 06/07/20 0914      Check-In   Supervising physician immediately available to respond to emergencies See telemetry face sheet for immediately available ER MD    Location [facility]-Cardiac & Pulmonary Rehab    Staff Present Heath Lark, RN, BSN, Lance Sell, BA, ACSM CEP, Exercise Physiologist;Kara Eliezer Bottom, MS Exercise Physiologist    Virtual Visit No    Medication changes reported     No    Fall or balance concerns reported    No    Warm-up and Cool-down Performed on first and last piece of equipment    Resistance Training Performed Yes    VAD Patient? No    PAD/SET Patient? No      Pain Assessment   Currently in Pain? No/denies              Social History   Tobacco Use  Smoking Status Never Smoker  Smokeless Tobacco Never Used    Goals Met:  Independence with exercise equipment Exercise tolerated well No report of cardiac concerns or symptoms  Goals Unmet:  Not Applicable  Comments: Pt able to follow exercise prescription today without complaint.  Will continue to monitor for progression.    Dr. Emily Filbert is Medical Director for [place] and [facility].

## 2020-06-13 ENCOUNTER — Encounter: Payer: Self-pay | Admitting: *Deleted

## 2020-06-13 DIAGNOSIS — Z951 Presence of aortocoronary bypass graft: Secondary | ICD-10-CM

## 2020-06-13 NOTE — Progress Notes (Signed)
Cardiac Individual Treatment Plan  Patient Details  Name: Reginald Murphy MRN: [id number] Date of Birth: 1956-04-10 Referring Provider:     Cardiac Rehab from 05/15/2020 in [facility] Cardiac and Pulmonary Rehab  Referring Provider Bartholome Bill MD      Initial Encounter Date:    Cardiac Rehab from 05/15/2020 in [facility] Cardiac and Pulmonary Rehab  Date 05/15/20      Visit Diagnosis: S/P CABG x 3  Patient's Home Medications on Admission:  Current Outpatient Medications:  .  ACCU-CHEK GUIDE test strip, Please specify directions, refills and quantity, Disp: 100 each, Rfl: 11 .  albuterol (VENTOLIN HFA) 108 (90 Base) MCG/ACT inhaler, TAKE 2 PUFFS BY MOUTH EVERY 6 HOURS AS NEEDED FOR WHEEZE OR SHORTNESS OF BREATH (Patient not taking: Reported on 05/02/2020), Disp: 18 g, Rfl: 2 .  amLODipine (NORVASC) 5 MG tablet, TAKE 1 TABLET BY MOUTH EVERY DAY (Patient not taking: Reported on 05/02/2020), Disp: 90 tablet, Rfl: 0 .  aspirin EC 81 MG tablet, Take 81 mg by mouth daily. , Disp: , Rfl:  .  atorvastatin (LIPITOR) 40 MG tablet, Take 40 mg by mouth at bedtime., Disp: , Rfl:  .  Blood Glucose Monitoring Suppl (ACCU-CHEK NANO SMARTVIEW) w/Device KIT, 1 strip by Does not apply route daily., Disp: 1 kit, Rfl: 0 .  glipiZIDE (GLUCOTROL XL) 10 MG 24 hr tablet, Take by mouth. (Patient not taking: Reported on 05/10/2020), Disp: , Rfl:  .  glucose blood test strip, 1 each by Other route as needed for other. Use as instructed, Disp: , Rfl:  .  lisinopril (ZESTRIL) 40 MG tablet, TAKE 1 TABLET (40 MG TOTAL) BY MOUTH DAILY. (Patient not taking: Reported on 05/02/2020), Disp: 90 tablet, Rfl: 3 .  metFORMIN (GLUCOPHAGE) 1000 MG tablet, TAKE 1 TABLET BY MOUTH 2 TIMES DAILY WITH A MEAL., Disp: 180 tablet, Rfl: 0 .  metoprolol tartrate (LOPRESSOR) 25 MG tablet, Take by mouth., Disp: , Rfl:  .  nitroGLYCERIN (NITROSTAT) 0.4 MG SL tablet, SMARTSIG:1 Tablet(s) Sublingual PRN, Disp: , Rfl:  .  ONETOUCH DELICA LANCETS 07E MISC, 2  (two) times daily. for testing, Disp: , Rfl: 12 .  SYMBICORT 80-4.5 MCG/ACT inhaler, TAKE 2 PUFFS BY MOUTH TWICE A DAY (Patient not taking: Reported on 05/02/2020), Disp: 30.6 Inhaler, Rfl: 1 .  tadalafil (CIALIS) 20 MG tablet, Take 1 tablet (20 mg total) by mouth every three (3) days as needed for erectile dysfunction., Disp: 30 tablet, Rfl: 5 .  TRULICITY 6.75 QG/9.2EF SOPN, SMARTSIG:0.5 Milliliter(s) SUB-Q Once a Week, Disp: 12 mL, Rfl: 3  Past Medical History: Past Medical History:  Diagnosis Date  . Diabetes mellitus without complication ([place])   . Hypertension     Tobacco Use: Social History   Tobacco Use  Smoking Status Never Smoker  Smokeless Tobacco Never Used    Labs: Recent Review Flowsheet Data    Labs for ITP Cardiac and Pulmonary Rehab Latest Ref Rng & Units 10/11/2018 03/03/2019 07/18/2019 11/14/2019 05/23/2020   Cholestrol 100 - 199 mg/dL - 176 - - -   LDLCALC 0 - 99 mg/dL - 80 - - -   HDL >39 mg/dL - 30(L) - - -   Trlycerides 0 - 149 mg/dL - 329(H) - - -   Hemoglobin A1c 4.0 - 5.6 % 7.5(A) 7.0(A) 7.0(A) 7.3(A) 5.9(A)       Exercise Target Goals: Exercise Program Goal: Individual exercise prescription set using results from initial 6 min walk test and THRR while considering  patient's  activity barriers and safety.   Exercise Prescription Goal: Initial exercise prescription builds to 30-45 minutes a day of aerobic activity, 2-3 days per week.  Home exercise guidelines will be given to patient during program as part of exercise prescription that the participant will acknowledge.   Education: Aerobic Exercise & Resistance Training: - Gives group verbal and written instruction on the various components of exercise. Focuses on aerobic and resistive training programs and the benefits of this training and how to safely progress through these programs..   Education: Exercise & Equipment Safety: - Individual verbal instruction and demonstration of equipment use and safety  with use of the equipment.   Cardiac Rehab from 05/23/2020 in [facility] Cardiac and Pulmonary Rehab  Date 05/15/20  Educator [facility]  Instruction Review Code 1- Verbalizes Understanding      Education: Exercise Physiology & General Exercise Guidelines: - Group verbal and written instruction with models to review the exercise physiology of the cardiovascular system and associated critical values. Provides general exercise guidelines with specific guidelines to those with heart or lung disease.    Cardiac Rehab from 05/23/2020 in [facility] Cardiac and Pulmonary Rehab  Date 05/15/20  Instruction Review Code 3- Needs Reinforcement  [need identified]      Education: Flexibility, Balance, Mind/Body Relaxation: Provides group verbal/written instruction on the benefits of flexibility and balance training, including mind/body exercise modes such as yoga, pilates and tai chi.  Demonstration and skill practice provided.   Activity Barriers & Risk Stratification:  Activity Barriers & Cardiac Risk Stratification - 05/15/20 1310      Activity Barriers & Cardiac Risk Stratification   Activity Barriers Deconditioning;Muscular Weakness;Balance Concerns    Cardiac Risk Stratification High           6 Minute Walk:  6 Minute Walk    Row Name 05/15/20 1307         6 Minute Walk   Phase Initial     Distance 1235 feet     Walk Time 6 minutes     # of Rest Breaks 0     MPH 2.34     METS 3.1     RPE 8     VO2 Peak 10.84     Symptoms No     Resting HR 86 bpm     Resting BP 124/62     Resting Oxygen Saturation  98 %     Exercise Oxygen Saturation  during 6 min walk 99 %     Max Ex. HR 114 bpm     Max Ex. BP 130/64     2 Minute Post BP 134/70            Oxygen Initial Assessment:   Oxygen Re-Evaluation:   Oxygen Discharge (Final Oxygen Re-Evaluation):   Initial Exercise Prescription:  Initial Exercise Prescription - 05/15/20 1300      Date of Initial Exercise RX and Referring Provider    Date 05/15/20    Referring Provider Bartholome Bill MD      Treadmill   MPH 2.3    Grade 1    Minutes 15    METs 3.08      Elliptical   Level 1    Speed 2.7    Minutes 15    METs 3      REL-XR   Level 3    Speed 50    Minutes 15    METs 3      Prescription Details   Frequency (times per  week) 2    Duration Progress to 30 minutes of continuous aerobic without signs/symptoms of physical distress      Intensity   THRR 40-80% of Max Heartrate 115-144    Ratings of Perceived Exertion 11-13    Perceived Dyspnea 0-4      Progression   Progression Continue to progress workloads to maintain intensity without signs/symptoms of physical distress.      Resistance Training   Training Prescription Yes    Weight 3 lb    Reps 10-15           Perform Capillary Blood Glucose checks as needed.  Exercise Prescription Changes:  Exercise Prescription Changes    Row Name 05/15/20 1300 05/23/20 0700 05/30/20 0700 06/11/20 1700       Response to Exercise   Blood Pressure (Admit) 124/62 -- 128/70 132/72    Blood Pressure (Exercise) 130/64 -- 114/54 140/68    Blood Pressure (Exit) 134/70 -- 118/72 130/70    Heart Rate (Admit) 86 bpm -- 82 bpm 84 bpm    Heart Rate (Exercise) 114 bpm -- 107 bpm 116 bpm    Heart Rate (Exit) 87 bpm -- 91 bpm 82 bpm    Oxygen Saturation (Admit) 98 % -- -- --    Oxygen Saturation (Exercise) 99 % -- -- --    Rating of Perceived Exertion (Exercise) 8 -- 12 15    Symptoms none -- none none    Comments walk test results -- -- --    Duration -- -- Continue with 30 min of aerobic exercise without signs/symptoms of physical distress. Continue with 30 min of aerobic exercise without signs/symptoms of physical distress.    Intensity -- -- THRR unchanged THRR unchanged      Progression   Progression -- -- Continue to progress workloads to maintain intensity without signs/symptoms of physical distress. Continue to progress workloads to maintain intensity without  signs/symptoms of physical distress.    Average METs -- -- 3.52 3.4      Resistance Training   Training Prescription -- -- Yes Yes    Weight -- -- 3 lb 5 lb    Reps -- -- 10-15 10-15      Interval Training   Interval Training -- -- No No      Treadmill   MPH -- -- 2.5 2.5    Grade -- -- 1.5 1.5    Minutes -- -- 15 15    METs -- -- 3.43 3.43      Elliptical   Level -- -- 1 1    Speed -- -- 2.7 2.7    Minutes -- -- 5 4      REL-XR   Level -- -- 5 15    Minutes -- -- 15 15    METs -- -- 3.6 --      Home Exercise Plan   Plans to continue exercise at -- Home (comment)  walking, bike, elliptical, weights Home (comment)  walking, bike, elliptical, weights Home (comment)  walking, bike, elliptical, weights    Frequency -- Add 3 additional days to program exercise sessions. Add 3 additional days to program exercise sessions. Add 3 additional days to program exercise sessions.    Initial Home Exercises Provided -- 05/23/20 05/23/20 --           Exercise Comments:  Exercise Comments    Row Name 05/17/20 [phone]           Exercise Comments First full day of  exercise!  Patient was oriented to gym and equipment including functions, settings, policies, and procedures.  Patient's individual exercise prescription and treatment plan were reviewed.  All starting workloads were established based on the results of the 6 minute walk test done at initial orientation visit.  The plan for exercise progression was also introduced and progression will be customized based on patient's performance and goals.              Exercise Goals and Review:  Exercise Goals    Row Name 05/15/20 1314             Exercise Goals   Increase Physical Activity Yes       Intervention Provide advice, education, support and counseling about physical activity/exercise needs.;Develop an individualized exercise prescription for aerobic and resistive training based on initial evaluation findings, risk  stratification, comorbidities and participant's personal goals.       Expected Outcomes Short Term: Attend rehab on a regular basis to increase amount of physical activity.;Long Term: Add in home exercise to make exercise part of routine and to increase amount of physical activity.;Long Term: Exercising regularly at least 3-5 days a week.       Increase Strength and Stamina Yes       Intervention Develop an individualized exercise prescription for aerobic and resistive training based on initial evaluation findings, risk stratification, comorbidities and participant's personal goals.;Provide advice, education, support and counseling about physical activity/exercise needs.       Expected Outcomes Short Term: Increase workloads from initial exercise prescription for resistance, speed, and METs.;Short Term: Perform resistance training exercises routinely during rehab and add in resistance training at home;Long Term: Improve cardiorespiratory fitness, muscular endurance and strength as measured by increased METs and functional capacity (6MWT)       Able to understand and use rate of perceived exertion (RPE) scale Yes       Intervention Provide education and explanation on how to use RPE scale       Expected Outcomes Short Term: Able to use RPE daily in rehab to express subjective intensity level;Long Term:  Able to use RPE to guide intensity level when exercising independently       Able to understand and use Dyspnea scale Yes       Intervention Provide education and explanation on how to use Dyspnea scale       Expected Outcomes Short Term: Able to use Dyspnea scale daily in rehab to express subjective sense of shortness of breath during exertion;Long Term: Able to use Dyspnea scale to guide intensity level when exercising independently       Knowledge and understanding of Target Heart Rate Range (THRR) Yes       Intervention Provide education and explanation of THRR including how the numbers were predicted  and where they are located for reference       Expected Outcomes Short Term: Able to state/look up THRR;Short Term: Able to use daily as guideline for intensity in rehab;Long Term: Able to use THRR to govern intensity when exercising independently       Able to check pulse independently Yes       Intervention Provide education and demonstration on how to check pulse in carotid and radial arteries.;Review the importance of being able to check your own pulse for safety during independent exercise       Expected Outcomes Short Term: Able to explain why pulse checking is important during independent exercise;Long Term: Able to check pulse independently  and accurately       Understanding of Exercise Prescription Yes       Intervention Provide education, explanation, and written materials on patient's individual exercise prescription       Expected Outcomes Short Term: Able to explain program exercise prescription;Long Term: Able to explain home exercise prescription to exercise independently              Exercise Goals Re-Evaluation :  Exercise Goals Re-Evaluation    Row Name 05/17/20 0824 05/23/20 0736 05/29/20 0740 06/11/20 1718       Exercise Goal Re-Evaluation   Exercise Goals Review Able to understand and use rate of perceived exertion (RPE) scale;Knowledge and understanding of Target Heart Rate Range (THRR);Able to understand and use Dyspnea scale;Understanding of Exercise Prescription Increase Physical Activity;Increase Strength and Stamina;Able to understand and use rate of perceived exertion (RPE) scale;Able to understand and use Dyspnea scale;Knowledge and understanding of Target Heart Rate Range (THRR);Understanding of Exercise Prescription;Able to check pulse independently Increase Physical Activity;Increase Strength and Stamina;Able to understand and use rate of perceived exertion (RPE) scale;Able to understand and use Dyspnea scale;Knowledge and understanding of Target Heart Rate Range  (THRR);Understanding of Exercise Prescription;Able to check pulse independently Increase Physical Activity;Increase Strength and Stamina;Able to understand and use rate of perceived exertion (RPE) scale    Comments Reviewed RPE and dyspnea scales, THR and program prescription with pt today.  Pt voiced understanding and was given a copy of goals to take home. Reviewed home exercise with pt today.  Pt plans to use stationary bike and elliptical at home for exercise.  He will also continue to use his weights and walk when at beach too.  Reviewed THR, pulse, RPE, sign and symptoms, pulse oximetery and when to call 911 or MD.  Also discussed weather considerations and indoor options.  Pt voiced understanding. Reginald Murphy hasnt tried to exercise at home yet.  He has an ellipitcal and bike at home.  He hasnt been sore since starting to exercise. Reginald Murphy tried the elliptical and made it 4 minutes !  he has increase to 5 lb for strength work.  Staff will monitor progress.    Expected Outcomes Short: Use RPE daily to regulate intensity. Long: Follow program prescription in THR. Short: Continue to exercise on off days  Long: Continue to improve stamina. Short:  add in home exercise Long: improve stamina and MET level Short: build up to 15 min on elliptical Long: improve overall MET level           Discharge Exercise Prescription (Final Exercise Prescription Changes):  Exercise Prescription Changes - 06/11/20 1700      Response to Exercise   Blood Pressure (Admit) 132/72    Blood Pressure (Exercise) 140/68    Blood Pressure (Exit) 130/70    Heart Rate (Admit) 84 bpm    Heart Rate (Exercise) 116 bpm    Heart Rate (Exit) 82 bpm    Rating of Perceived Exertion (Exercise) 15    Symptoms none    Duration Continue with 30 min of aerobic exercise without signs/symptoms of physical distress.    Intensity THRR unchanged      Progression   Progression Continue to progress workloads to maintain intensity without  signs/symptoms of physical distress.    Average METs 3.4      Resistance Training   Training Prescription Yes    Weight 5 lb    Reps 10-15      Interval Training   Interval Training No  Treadmill   MPH 2.5    Grade 1.5    Minutes 15    METs 3.43      Elliptical   Level 1    Speed 2.7    Minutes 4      REL-XR   Level 15    Minutes 15      Home Exercise Plan   Plans to continue exercise at Home (comment)   walking, bike, elliptical, weights   Frequency Add 3 additional days to program exercise sessions.           Nutrition:  Target Goals: Understanding of nutrition guidelines, daily intake of sodium <1572m, cholesterol <2046m calories 30% from fat and 7% or less from saturated fats, daily to have 5 or more servings of fruits and vegetables.  Education: Controlling Sodium/Reading Food Labels -Group verbal and written material supporting the discussion of sodium use in heart healthy nutrition. Review and explanation with models, verbal and written materials for utilization of the food label.   Education: General Nutrition Guidelines/Fats and Fiber: -Group instruction provided by verbal, written material, models and posters to present the general guidelines for heart healthy nutrition. Gives an explanation and review of dietary fats and fiber.   [facility]  Date 05/17/20  [facility] 1- Verbalizes Understanding  [need identified]      Biometrics:  Pre Biometrics - 05/15/20 1314      Pre Biometrics   Height 5' 9.5" (1.765 m)    Weight 221 lb 4.8 oz (100.4 kg)    BMI (Calculated) 32.22    Single Leg Stand 4.72 seconds            Nutrition Therapy Plan and Nutrition Goals:  Nutrition Therapy & Goals - 05/29/20 0817      Nutrition Therapy   Diet Heart healthy, Low Na, T2DM    Drug/Food Interactions Statins/Certain Fruits    Protein (specify units) 80g    Fiber 30 grams     Whole Grain Foods 3 servings    Saturated Fats 12 max. grams    Fruits and Vegetables 5 servings/day    Sodium 1.5 grams      Personal Nutrition Goals   Nutrition Goal ST: increase wate intake by 16oz per day  LT: Feel better 7/10 now - working on increasing energy    Comments Pt reports losing appetite and eating about half of his usual intake. B:OJ or low sugar grape juice and two pieces of toast and 2-3 slices of bacon (will not eat eggs). L: vary from meat with two vegetables to a sandwich. D: meat and two vegetables or sandwich or hamburger. S: small amount of ice cream. cooks vegetables steamed, baked, and grilled. Likes to eat salad with dark leafy greens as his salad base. Drinks elderberry juice. Eats a variety vegetables. Eats peanut butter and tries to eat more fruit. Tries to eat whole wheat bread, will sometimes have oatmeal. Pt reports high fat foods turn him off to eating and has now switched to 2% milk. sugar free mountain dew and unsweetened or half and half tea. Pt reports that blood sugar has been good, only had lows right after surgery - now at low 100s. No longer on glipizide anymore, now just on trulicity and metformin. Pt report having rouble with drinking water, but wants to drink more.      Intervention Plan   Intervention Prescribe, educate and counsel regarding individualized  specific dietary modifications aiming towards targeted core components such as weight, hypertension, lipid management, diabetes, heart failure and other comorbidities.;Nutrition handout(s) given to patient.    Expected Outcomes Short Term Goal: Understand basic principles of dietary content, such as calories, fat, sodium, cholesterol and nutrients.;Short Term Goal: A plan has been developed with personal nutrition goals set during dietitian appointment.;Long Term Goal: Adherence to prescribed nutrition plan.           Nutrition Assessments:  Nutrition Assessments - 05/15/20 1326      MEDFICTS  Scores   Pre Score 74           MEDIFICTS Score Key:          ?70 Need to make dietary changes          40-70 Heart Healthy Diet         ? 40 Therapeutic Level Cholesterol Diet  Nutrition Goals Re-Evaluation:   Nutrition Goals Discharge (Final Nutrition Goals Re-Evaluation):   Psychosocial: Target Goals: Acknowledge presence or absence of significant depression and/or stress, maximize coping skills, provide positive support system. Participant is able to verbalize types and ability to use techniques and skills needed for reducing stress and depression.   Education: Depression - Provides group verbal and written instruction on the correlation between heart/lung disease and depressed mood, treatment options, and the stigmas associated with seeking treatment.   Education: Sleep Hygiene -Provides group verbal and written instruction about how sleep can affect your health.  Define sleep hygiene, discuss sleep cycles and impact of sleep habits. Review good sleep hygiene tips.     Education: Stress and Anxiety: - Provides group verbal and written instruction about the health risks of elevated stress and causes of high stress.  Discuss the correlation between heart/lung disease and anxiety and treatment options. Review healthy ways to manage with stress and anxiety.    Initial Review & Psychosocial Screening:  Initial Psych Review & Screening - 05/10/20 0939      Initial Review   Current issues with Current Stress Concerns    Source of Stress Concerns Family    Comments mother has dementia (living at [place])      [place]? Yes   wife, cousins that live near by (he's an only child)     Barriers   Psychosocial barriers to participate in program There are no identifiable barriers or psychosocial needs.      Screening Interventions   Interventions Encouraged to exercise;To provide support and resources with identified psychosocial needs;Provide  feedback about the scores to participant    Expected Outcomes Short Term goal: Utilizing psychosocial counselor, staff and physician to assist with identification of specific Stressors or current issues interfering with healing process. Setting desired goal for each stressor or current issue identified.;Long Term Goal: Stressors or current issues are controlled or eliminated.;Short Term goal: Identification and review with participant of any Quality of Life or Depression concerns found by scoring the questionnaire.;Long Term goal: The participant improves quality of Life and PHQ9 Scores as seen by post scores and/or verbalization of changes           Quality of Life Scores:   Quality of Life - 05/15/20 1326      Quality of Life   Select Quality of Life      Quality of Life Scores   Health/Function Pre 29.57 %    Socioeconomic Pre 30 %    Psych/Spiritual Pre 30 %    Family  Pre 30 %    GLOBAL Pre 29.82 %          Scores of 19 and below usually indicate a poorer quality of life in these areas.  A difference of  2-3 points is a clinically meaningful difference.  A difference of 2-3 points in the total score of the Quality of Life Index has been associated with significant improvement in overall quality of life, self-image, physical symptoms, and general health in studies assessing change in quality of life.  PHQ-9: Recent Review Flowsheet Data    Depression screen [facility] 2/9 05/15/2020 07/18/2019 04/01/2017 04/01/2017   Decreased Interest 0 0 0 0   Down, Depressed, Hopeless 0 0 0 0   PHQ - 2 Score 0 0 0 0   Altered sleeping 0 - 2 -   Tired, decreased energy 0 - 1 -   Change in appetite 0 - 0 -   Feeling bad or failure about yourself  0 - 0 -   Trouble concentrating 0 - 0 -   Moving slowly or fidgety/restless 0 - 0 -   Suicidal thoughts 0 - 0 -   PHQ-9 Score 0 - 3 -   Difficult doing work/chores Not difficult at all - - -     Interpretation of Total Score  Total Score Depression  Severity:  1-4 = Minimal depression, 5-9 = Mild depression, 10-14 = Moderate depression, 15-19 = Moderately severe depression, 20-27 = Severe depression   Psychosocial Evaluation and Intervention:  Psychosocial Evaluation - 05/10/20 0946      Psychosocial Evaluation & Interventions   Interventions Encouraged to exercise with the program and follow exercise prescription    Comments 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  He is a good spirited man. He is an only child but has a great support system with his wife and cousins that live nearby.  His mother is now living in [place] with dementia, which got worse while he was having to undergo surgery.  Overall, he is doing very well and eager to get going again.  He wants to get back to his normal and be able to walk on the beach again without having to stop to catch his breath.  He sleeps well at night and has no other major stressors currently.    Expected Outcomes Short: Attend rehab to build stamina  Long: Continue to cope well with recovery    Continue Psychosocial Services  Follow up required by staff           Psychosocial Re-Evaluation:  Psychosocial Re-Evaluation    [place] Name 05/29/20 [id number]             Psychosocial Re-Evaluation   Current issues with Current Stress Concerns       Comments Reginald Murphy has no new stress concerns - says he has less stress since he got his taxes done.       Expected Outcomes Short: continue to exercise to manage stress Long: maintain positive outlook              Psychosocial Discharge (Final Psychosocial Re-Evaluation):  Psychosocial Re-Evaluation - 05/29/20 0742      Psychosocial Re-Evaluation   Current issues with Current Stress Concerns    Comments Reginald Murphy has no new stress concerns - says he has less stress since he got his taxes done.    Expected Outcomes Short: continue to exercise to manage stress Long: maintain positive outlook  Vocational Rehabilitation: Provide  vocational rehab assistance to qualifying candidates.   Vocational Rehab Evaluation & Intervention:  Vocational Rehab - 05/10/20 2536      Initial Vocational Rehab Evaluation & Intervention   Assessment shows need for Vocational Rehabilitation No   retired          Education: Education Goals: Education classes will be provided on a variety of topics geared toward better understanding of heart health and risk factor modification. Participant will state understanding/return demonstration of topics presented as noted by education test scores.  Learning Barriers/Preferences:  Learning Barriers/Preferences - 05/10/20 6440      Learning Barriers/Preferences   Learning Barriers Sight   glasses for reading   Learning Preferences None           General Cardiac Education Topics:  AED/CPR: - Group verbal and written instruction with the use of models to demonstrate the basic use of the AED with the basic ABC's of resuscitation.   Anatomy & Physiology of the Heart: - Group verbal and written instruction and models provide basic cardiac anatomy and physiology, with the coronary electrical and arterial systems. Review of Valvular disease and Heart Failure   Cardiac Rehab from 05/23/2020 in [facility] Cardiac and Pulmonary Rehab  Date 05/15/20  Instruction Review Code 3- Needs Reinforcement  [need identified]      Cardiac Procedures: - Group verbal and written instruction to review commonly prescribed medications for heart disease. Reviews the medication, class of the drug, and side effects. Includes the steps to properly store meds and maintain the prescription regimen. (beta blockers and nitrates)   Cardiac Medications I: - Group verbal and written instruction to review commonly prescribed medications for heart disease. Reviews the medication, class of the drug, and side effects. Includes the steps to properly store meds and maintain the prescription regimen.   Cardiac Rehab from 05/23/2020  in [facility] Cardiac and Pulmonary Rehab  Date 05/23/20  Educator SB  Instruction Review Code 1- Verbalizes Understanding      Cardiac Medications II: -Group verbal and written instruction to review commonly prescribed medications for heart disease. Reviews the medication, class of the drug, and side effects. (all other drug classes)    Go Sex-Intimacy & Heart Disease, Get SMART - Goal Setting: - Group verbal and written instruction through game format to discuss heart disease and the return to sexual intimacy. Provides group verbal and written material to discuss and apply goal setting through the application of the S.M.A.R.T. Method.   Other Matters of the Heart: - Provides group verbal, written materials and models to describe Stable Angina and Peripheral Artery. Includes description of the disease process and treatment options available to the cardiac patient.   Infection Prevention: - Provides verbal and written material to individual with discussion of infection control including proper hand washing and proper equipment cleaning during exercise session.   Cardiac Rehab from 05/23/2020 in [facility] Cardiac and Pulmonary Rehab  Date 05/15/20  Educator [facility]  Instruction Review Code 1- Verbalizes Understanding      Falls Prevention: - Provides verbal and written material to individual with discussion of falls prevention and safety.   Cardiac Rehab from 05/23/2020 in [facility] Cardiac and Pulmonary Rehab  Date 05/15/20  Educator [facility]  Instruction Review Code 1- Verbalizes Understanding      Other: -Provides group and verbal instruction on various topics (see comments)   Knowledge Questionnaire Score:  Knowledge Questionnaire Score - 05/15/20 1326      Knowledge Questionnaire Score  Pre Score 24/28 Education Focus: Angina, Nutrtition, Exercise           Core Components/Risk Factors/Patient Goals at Admission:  Personal Goals and Risk Factors at Admission - 05/15/20 1329      Core  Components/Risk Factors/Patient Goals on Admission    Weight Management Yes;Obesity;Weight Loss    Intervention Weight Management: Develop a combined nutrition and exercise program designed to reach desired caloric intake, while maintaining appropriate intake of nutrient and fiber, sodium and fats, and appropriate energy expenditure required for the weight goal.;Weight Management: Provide education and appropriate resources to help participant work on and attain dietary goals.;Weight Management/Obesity: Establish reasonable short term and long term weight goals.;Obesity: Provide education and appropriate resources to help participant work on and attain dietary goals.    Admit Weight 221 lb 4.8 oz (100.4 kg)    Goal Weight: Short Term 215 lb (97.5 kg)    Goal Weight: Long Term 200 lb (90.7 kg)    Expected Outcomes Short Term: Continue to assess and modify interventions until short term weight is achieved;Long Term: Adherence to nutrition and physical activity/exercise program aimed toward attainment of established weight goal;Weight Loss: Understanding of general recommendations for a balanced deficit meal plan, which promotes 1-2 lb weight loss per week and includes a negative energy balance of [phone] kcal/d;Understanding recommendations for meals to include 15-35% energy as protein, 25-35% energy from fat, 35-60% energy from carbohydrates, less than 262m of dietary cholesterol, 20-35 gm of total fiber daily;Understanding of distribution of calorie intake throughout the day with the consumption of 4-5 meals/snacks    Diabetes Yes    Intervention Provide education about signs/symptoms and action to take for hypo/hyperglycemia.;Provide education about proper nutrition, including hydration, and aerobic/resistive exercise prescription along with prescribed medications to achieve blood glucose in normal ranges: Fasting glucose 65-99 mg/dL    Expected Outcomes Short Term: Participant verbalizes understanding  of the signs/symptoms and immediate care of hyper/hypoglycemia, proper foot care and importance of medication, aerobic/resistive exercise and nutrition plan for blood glucose control.;Long Term: Attainment of HbA1C < 7%.    Hypertension Yes    Intervention Provide education on lifestyle modifcations including regular physical activity/exercise, weight management, moderate sodium restriction and increased consumption of fresh fruit, vegetables, and low fat dairy, alcohol moderation, and smoking cessation.;Monitor prescription use compliance.    Expected Outcomes Short Term: Continued assessment and intervention until BP is < 140/91mHG in hypertensive participants. < 130/8012mG in hypertensive participants with diabetes, heart failure or chronic kidney disease.;Long Term: Maintenance of blood pressure at goal levels.    Lipids Yes    Intervention Provide education and support for participant on nutrition & aerobic/resistive exercise along with prescribed medications to achieve LDL <58m73mDL >40mg18m Expected Outcomes Short Term: Participant states understanding of desired cholesterol values and is compliant with medications prescribed. Participant is following exercise prescription and nutrition guidelines.;Long Term: Cholesterol controlled with medications as prescribed, with individualized exercise RX and with personalized nutrition plan. Value goals: LDL < 58mg,34m > 40 mg.           Education:Diabetes - Individual verbal and written instruction to review signs/symptoms of diabetes, desired ranges of glucose level fasting, after meals and with exercise. Acknowledge that pre and post exercise glucose checks will be done for 3 sessions at entry of program.   [facility]  Date 05/15/20  [facility] 1- Verbalizes Understanding  Education: Know Your Numbers and Risk Factors: -Group verbal and written instruction  about important numbers in your health.  Discussion of what are risk factors and how they play a role in the disease process.  Review of Cholesterol, Blood Pressure, Diabetes, and BMI and the role they play in your overall health.   Core Components/Risk Factors/Patient Goals Review:   Goals and Risk Factor Review    Row Name 05/29/20 0739 05/31/20 0728           Core Components/Risk Factors/Patient Goals Review   Personal Goals Review Weight Management/Obesity;Hypertension;Diabetes;Lipids Weight Management/Obesity;Hypertension;Diabetes;Lipids      Review Reginald Murphy checks his BG at home on days not at [facility].  Normally it runs around 110.  He reports taking meds as directed. --      Expected Outcomes Short: attend HT consistently Long: manage risk factors on his own --             Core Components/Risk Factors/Patient Goals at Discharge (Final Review):   Goals and Risk Factor Review - 05/31/20 0728      Core Components/Risk Factors/Patient Goals Review   Personal Goals Review Weight Management/Obesity;Hypertension;Diabetes;Lipids           ITP Comments:  ITP Comments    Row Name 05/10/20 0949 05/15/20 1304 05/16/20 1624 05/17/20 0824 06/13/20 0613   ITP Comments Completed virtual orientation today.  EP evaluation is scheduled for Tues 9/7 at 9am.  Documentation for diagnosis can be found in [facility] encounter 04/04/20. Completed 6MWT and gym orientation. Initial ITP created and sent for review to Dr. Emily Filbert, Medical Director. 30 day review completed. ITP sent to Dr. Emily Filbert, Medical Director of Cardiac and Pulmonary Rehab. Continue with ITP unless changes are made by physician. First full day of exercise!  Patient was oriented to gym and equipment including functions, settings, policies, and procedures.  Patient's individual exercise prescription and treatment plan were reviewed.  All starting workloads were established based on the results of the 6 minute walk test done at initial  orientation visit.  The plan for exercise progression was also introduced and progression will be customized based on patient's performance and goals. 30 Day review completed. Medical Director ITP review done, changes made as directed, and signed approval by Medical Director.          Comments:

## 2020-06-14 ENCOUNTER — Encounter: Payer: 59 | Attending: Cardiology | Admitting: *Deleted

## 2020-06-14 ENCOUNTER — Other Ambulatory Visit: Payer: Self-pay

## 2020-06-14 DIAGNOSIS — Z951 Presence of aortocoronary bypass graft: Secondary | ICD-10-CM | POA: Insufficient documentation

## 2020-06-14 NOTE — Progress Notes (Signed)
Daily Session Note  Patient Details  Name: Reginald Murphy MRN: [id number] Date of Birth: 1956/01/06 Referring Provider:     Cardiac Rehab from 05/15/2020 in [facility] Cardiac and Pulmonary Rehab  Referring Provider Bartholome Bill MD      Encounter Date: 06/14/2020  Check In:  Session Check In - 06/14/20 0819      Check-In   Supervising physician immediately available to respond to emergencies See telemetry face sheet for immediately available ER MD    Location [facility]-Cardiac & Pulmonary Rehab    Staff Present Heath Lark, RN, BSN, CCRP;Melissa Arkport RDN, Rowe Pavy, BA, ACSM CEP, Exercise Physiologist;Jessica Coplay, MA, RCEP, CCRP, CCET    Virtual Visit No    Medication changes reported     No    Fall or balance concerns reported    No    Warm-up and Cool-down Performed on first and last piece of equipment    Resistance Training Performed Yes    VAD Patient? No    PAD/SET Patient? No      Pain Assessment   Currently in Pain? No/denies              Social History   Tobacco Use  Smoking Status Never Smoker  Smokeless Tobacco Never Used    Goals Met:  Independence with exercise equipment Exercise tolerated well No report of cardiac concerns or symptoms Strength training completed today  Goals Unmet:  Not Applicable  Comments: Pt able to follow exercise prescription today without complaint.  Will continue to monitor for progression.    Dr. Emily Filbert is Medical Director for [place] and [facility].

## 2020-06-17 ENCOUNTER — Other Ambulatory Visit: Payer: Self-pay | Admitting: Family Medicine

## 2020-06-17 DIAGNOSIS — I10 Essential (primary) hypertension: Secondary | ICD-10-CM

## 2020-06-19 ENCOUNTER — Other Ambulatory Visit: Payer: Self-pay

## 2020-06-19 ENCOUNTER — Encounter: Payer: 59 | Admitting: *Deleted

## 2020-06-19 DIAGNOSIS — Z951 Presence of aortocoronary bypass graft: Secondary | ICD-10-CM

## 2020-06-19 NOTE — Progress Notes (Signed)
Daily Session Note  Patient Details  Name: Reginald Murphy MRN: [id number] Date of Birth: 1956/08/15 Referring Provider:     Cardiac Rehab from 05/15/2020 in [facility] Cardiac and Pulmonary Rehab  Referring Provider Bartholome Bill MD      Encounter Date: 06/19/2020  Check In:  Session Check In - 06/19/20 0839      Check-In   Supervising physician immediately available to respond to emergencies See telemetry face sheet for immediately available ER MD    Location [facility]-Cardiac & Pulmonary Rehab    Staff Present Heath Lark, RN, BSN, Lance Sell, BA, ACSM CEP, Exercise Physiologist;Kara Eliezer Bottom, MS Exercise Physiologist    Virtual Visit No    Medication changes reported     No    Fall or balance concerns reported    No    Warm-up and Cool-down Performed on first and last piece of equipment    Resistance Training Performed Yes    VAD Patient? No    PAD/SET Patient? No      Pain Assessment   Currently in Pain? No/denies              Social History   Tobacco Use  Smoking Status Never Smoker  Smokeless Tobacco Never Used    Goals Met:  Independence with exercise equipment Exercise tolerated well No report of cardiac concerns or symptoms  Goals Unmet:  Not Applicable  Comments: Pt able to follow exercise prescription today without complaint.  Will continue to monitor for progression.    Dr. Emily Filbert is Medical Director for [place] and [facility].

## 2020-06-21 ENCOUNTER — Other Ambulatory Visit: Payer: Self-pay

## 2020-06-21 ENCOUNTER — Encounter: Payer: 59 | Admitting: *Deleted

## 2020-06-21 DIAGNOSIS — Z951 Presence of aortocoronary bypass graft: Secondary | ICD-10-CM

## 2020-06-21 NOTE — Progress Notes (Signed)
Daily Session Note  Patient Details  Name: DARTANYON FRANKOWSKI MRN: [id number] Date of Birth: 1955-12-29 Referring Provider:     Cardiac Rehab from 05/15/2020 in [facility] Cardiac and Pulmonary Rehab  Referring Provider Bartholome Bill MD      Encounter Date: 06/21/2020  Check In:  Session Check In - 06/21/20 0730      Check-In   Supervising physician immediately available to respond to emergencies See telemetry face sheet for immediately available ER MD    Location [facility]-Cardiac & Pulmonary Rehab    Staff Present Heath Lark, RN, BSN, CCRP;Amanda Sommer, BA, ACSM CEP, Exercise Physiologist;Melissa Caiola RDN, LDN    Virtual Visit No    Medication changes reported     No    Fall or balance concerns reported    No    Warm-up and Cool-down Performed on first and last piece of equipment    Resistance Training Performed Yes    VAD Patient? No    PAD/SET Patient? No      Pain Assessment   Currently in Pain? No/denies              Social History   Tobacco Use  Smoking Status Never Smoker  Smokeless Tobacco Never Used    Goals Met:  Independence with exercise equipment Exercise tolerated well No report of cardiac concerns or symptoms  Goals Unmet:  Not Applicable  Comments: Pt able to follow exercise prescription today without complaint.  Will continue to monitor for progression.   Dr. Emily Filbert is Medical Director for [place] and [facility].

## 2020-06-26 ENCOUNTER — Encounter: Payer: 59 | Admitting: *Deleted

## 2020-06-26 ENCOUNTER — Other Ambulatory Visit: Payer: Self-pay

## 2020-06-26 DIAGNOSIS — Z951 Presence of aortocoronary bypass graft: Secondary | ICD-10-CM

## 2020-06-26 NOTE — Progress Notes (Signed)
Daily Session Note  Patient Details  Name: Reginald Murphy MRN: [id number] Date of Birth: 11/11/1955 Referring Provider:     Cardiac Rehab from 05/15/2020 in [facility] Cardiac and Pulmonary Rehab  Referring Provider Bartholome Bill MD      Encounter Date: 06/26/2020  Check In:  Session Check In - 06/26/20 0916      Check-In   Supervising physician immediately available to respond to emergencies See telemetry face sheet for immediately available ER MD    Location [facility]-Cardiac & Pulmonary Rehab    Staff Present Heath Lark, RN, BSN, Lance Sell, BA, ACSM CEP, Exercise Physiologist;Kara Eliezer Bottom, MS Exercise Physiologist    Virtual Visit No    Medication changes reported     No    Fall or balance concerns reported    No    Warm-up and Cool-down Performed on first and last piece of equipment    Resistance Training Performed Yes    VAD Patient? No    PAD/SET Patient? No      Pain Assessment   Currently in Pain? No/denies             Exercise Prescription Changes - 06/26/20 0800      Response to Exercise   Blood Pressure (Admit) 134/72    Blood Pressure (Exercise) 134/70    Blood Pressure (Exit) 118/64    Heart Rate (Admit) 76 bpm    Heart Rate (Exercise) 92 bpm    Heart Rate (Exit) 79 bpm    Rating of Perceived Exertion (Exercise) 13    Symptoms none    Duration Continue with 30 min of aerobic exercise without signs/symptoms of physical distress.    Intensity THRR unchanged      Progression   Progression Continue to progress workloads to maintain intensity without signs/symptoms of physical distress.    Average METs 3.63      Resistance Training   Training Prescription Yes    Weight 6 lb    Reps 10-15      Interval Training   Interval Training No      Treadmill   MPH 2.7    Grade 2.5    Minutes 15    METs 4      Recumbant Elliptical   Level 2.3    Minutes 15    METs 2.7      REL-XR   Level 10    Minutes 15    METs 4.2      Home Exercise Plan    Plans to continue exercise at Home (comment)   walking, bike, elliptical, weights   Frequency Add 3 additional days to program exercise sessions.    Initial Home Exercises Provided 05/23/20           Social History   Tobacco Use  Smoking Status Never Smoker  Smokeless Tobacco Never Used    Goals Met:  Independence with exercise equipment Exercise tolerated well No report of cardiac concerns or symptoms  Goals Unmet:  Not Applicable  Comments: Pt able to follow exercise prescription today without complaint.  Will continue to monitor for progression.    Dr. Emily Filbert is Medical Director for [place] and [facility].

## 2020-07-03 ENCOUNTER — Encounter: Payer: 59 | Admitting: *Deleted

## 2020-07-03 ENCOUNTER — Other Ambulatory Visit: Payer: Self-pay

## 2020-07-03 DIAGNOSIS — Z951 Presence of aortocoronary bypass graft: Secondary | ICD-10-CM

## 2020-07-03 NOTE — Progress Notes (Signed)
Daily Session Note  Patient Details  Name: Reginald Murphy MRN: [id number] Date of Birth: 16-Apr-1956 Referring Provider:     Cardiac Rehab from 05/15/2020 in [facility] Cardiac and Pulmonary Rehab  Referring Provider Bartholome Bill MD      Encounter Date: 07/03/2020  Check In:  Session Check In - 07/03/20 0721      Check-In   Supervising physician immediately available to respond to emergencies See telemetry face sheet for immediately available ER MD    Location [facility]-Cardiac & Pulmonary Rehab    Staff Present Renita Papa, RN Margurite Auerbach, MS Exercise Physiologist;Melissa Caiola RDN, LDN    Virtual Visit No    Medication changes reported     No    Fall or balance concerns reported    No    Warm-up and Cool-down Performed on first and last piece of equipment    Resistance Training Performed Yes    VAD Patient? No    PAD/SET Patient? No      Pain Assessment   Currently in Pain? No/denies              Social History   Tobacco Use  Smoking Status Never Smoker  Smokeless Tobacco Never Used    Goals Met:  Independence with exercise equipment Exercise tolerated well No report of cardiac concerns or symptoms Strength training completed today  Goals Unmet:  Not Applicable  Comments: Pt able to follow exercise prescription today without complaint.  Will continue to monitor for progression.    Dr. Emily Filbert is Medical Director for [place] and [facility].

## 2020-07-05 ENCOUNTER — Other Ambulatory Visit: Payer: Self-pay | Admitting: Family Medicine

## 2020-07-10 ENCOUNTER — Encounter: Payer: 59 | Attending: Cardiology | Admitting: *Deleted

## 2020-07-10 ENCOUNTER — Other Ambulatory Visit: Payer: Self-pay

## 2020-07-10 DIAGNOSIS — Z951 Presence of aortocoronary bypass graft: Secondary | ICD-10-CM | POA: Insufficient documentation

## 2020-07-10 NOTE — Progress Notes (Signed)
Daily Session Note  Patient Details  Name: Reginald Murphy MRN: [id number] Date of Birth: 03-29-1956 Referring Provider:     Cardiac Rehab from 05/15/2020 in [facility] Cardiac and Pulmonary Rehab  Referring Provider Bartholome Bill MD      Encounter Date: 07/10/2020  Check In:  Session Check In - 07/10/20 0915      Check-In   Supervising physician immediately available to respond to emergencies See telemetry face sheet for immediately available ER MD    Location [facility]-Cardiac & Pulmonary Rehab    Staff Present Heath Lark, RN, BSN, CCRP;Joseph Foy [place], [place], ACSM CEP, Exercise Physiologist    Virtual Visit No    Medication changes reported     No    Fall or balance concerns reported    No    Warm-up and Cool-down Performed on first and last piece of equipment    Resistance Training Performed Yes    VAD Patient? No    PAD/SET Patient? No      Pain Assessment   Currently in Pain? No/denies              Social History   Tobacco Use  Smoking Status Never Smoker  Smokeless Tobacco Never Used    Goals Met:  Independence with exercise equipment Exercise tolerated well No report of cardiac concerns or symptoms  Goals Unmet:  Not Applicable  Comments: Pt able to follow exercise prescription today without complaint.  Will continue to monitor for progression.    Dr. Emily Filbert is Medical Director for [place] and [facility].

## 2020-07-11 ENCOUNTER — Encounter: Payer: Self-pay | Admitting: *Deleted

## 2020-07-11 DIAGNOSIS — Z951 Presence of aortocoronary bypass graft: Secondary | ICD-10-CM

## 2020-07-11 NOTE — Progress Notes (Signed)
Cardiac Individual Treatment Plan  Patient Details  Name: Reginald Murphy MRN: [id number] Date of Birth: 1955-09-15 Referring Provider:     Cardiac Rehab from 05/15/2020 in [facility] Cardiac and Pulmonary Rehab  Referring Provider Bartholome Bill MD      Initial Encounter Date:    Cardiac Rehab from 05/15/2020 in [facility] Cardiac and Pulmonary Rehab  Date 05/15/20      Visit Diagnosis: S/P CABG x 3  Patient's Home Medications on Admission:  Current Outpatient Medications:  .  ACCU-CHEK GUIDE test strip, Please specify directions, refills and quantity, Disp: 100 each, Rfl: 11 .  albuterol (VENTOLIN HFA) 108 (90 Base) MCG/ACT inhaler, TAKE 2 PUFFS BY MOUTH EVERY 6 HOURS AS NEEDED FOR WHEEZE OR SHORTNESS OF BREATH (Patient not taking: Reported on 05/02/2020), Disp: 18 g, Rfl: 2 .  amLODipine (NORVASC) 5 MG tablet, TAKE 1 TABLET BY MOUTH EVERY DAY, Disp: 90 tablet, Rfl: 0 .  aspirin EC 81 MG tablet, Take 81 mg by mouth daily. , Disp: , Rfl:  .  atorvastatin (LIPITOR) 40 MG tablet, Take 40 mg by mouth at bedtime., Disp: , Rfl:  .  Blood Glucose Monitoring Suppl (ACCU-CHEK NANO SMARTVIEW) w/Device KIT, 1 strip by Does not apply route daily., Disp: 1 kit, Rfl: 0 .  glipiZIDE (GLUCOTROL XL) 10 MG 24 hr tablet, Take by mouth. (Patient not taking: Reported on 05/10/2020), Disp: , Rfl:  .  glucose blood test strip, 1 each by Other route as needed for other. Use as instructed, Disp: , Rfl:  .  lisinopril (ZESTRIL) 40 MG tablet, TAKE 1 TABLET (40 MG TOTAL) BY MOUTH DAILY. (Patient not taking: Reported on 05/02/2020), Disp: 90 tablet, Rfl: 3 .  metFORMIN (GLUCOPHAGE) 1000 MG tablet, TAKE 1 TABLET BY MOUTH 2 TIMES DAILY WITH A MEAL., Disp: 180 tablet, Rfl: 0 .  metoprolol tartrate (LOPRESSOR) 25 MG tablet, Take by mouth., Disp: , Rfl:  .  nitroGLYCERIN (NITROSTAT) 0.4 MG SL tablet, SMARTSIG:1 Tablet(s) Sublingual PRN, Disp: , Rfl:  .  ONETOUCH DELICA LANCETS 46T MISC, 2 (two) times daily. for testing, Disp: , Rfl:  12 .  SYMBICORT 80-4.5 MCG/ACT inhaler, TAKE 2 PUFFS BY MOUTH TWICE A DAY (Patient not taking: Reported on 05/02/2020), Disp: 30.6 Inhaler, Rfl: 1 .  tadalafil (CIALIS) 20 MG tablet, Take 1 tablet (20 mg total) by mouth every three (3) days as needed for erectile dysfunction., Disp: 30 tablet, Rfl: 5 .  TRULICITY 0.35 WS/5.6CL SOPN, SMARTSIG:0.5 Milliliter(s) SUB-Q Once a Week, Disp: 12 mL, Rfl: 3  Past Medical History: Past Medical History:  Diagnosis Date  . Diabetes mellitus without complication ([place])   . Hypertension     Tobacco Use: Social History   Tobacco Use  Smoking Status Never Smoker  Smokeless Tobacco Never Used    Labs: Recent Review Flowsheet Data    Labs for ITP Cardiac and Pulmonary Rehab Latest Ref Rng & Units 10/11/2018 03/03/2019 07/18/2019 11/14/2019 05/23/2020   Cholestrol 100 - 199 mg/dL - 176 - - -   LDLCALC 0 - 99 mg/dL - 80 - - -   HDL >39 mg/dL - 30(L) - - -   Trlycerides 0 - 149 mg/dL - 329(H) - - -   Hemoglobin A1c 4.0 - 5.6 % 7.5(A) 7.0(A) 7.0(A) 7.3(A) 5.9(A)       Exercise Target Goals: Exercise Program Goal: Individual exercise prescription set using results from initial 6 min walk test and THRR while considering  patient's activity barriers and safety.  Exercise Prescription Goal: Initial exercise prescription builds to 30-45 minutes a day of aerobic activity, 2-3 days per week.  Home exercise guidelines will be given to patient during program as part of exercise prescription that the participant will acknowledge.   Education: Aerobic Exercise & Resistance Training: - Gives group verbal and written instruction on the various components of exercise. Focuses on aerobic and resistive training programs and the benefits of this training and how to safely progress through these programs..   Education: Exercise & Equipment Safety: - Individual verbal instruction and demonstration of equipment use and safety with use of the equipment.   Cardiac Rehab  from 05/23/2020 in [facility] Cardiac and Pulmonary Rehab  Date 05/15/20  Educator [facility]  Instruction Review Code 1- Verbalizes Understanding      Education: Exercise Physiology & General Exercise Guidelines: - Group verbal and written instruction with models to review the exercise physiology of the cardiovascular system and associated critical values. Provides general exercise guidelines with specific guidelines to those with heart or lung disease.    Cardiac Rehab from 06/21/2020 in [facility] Cardiac and Pulmonary Rehab  Date 06/21/20  Educator [facility]  Instruction Review Code 1- Verbalizes Understanding      Education: Flexibility, Balance, Mind/Body Relaxation: Provides group verbal/written instruction on the benefits of flexibility and balance training, including mind/body exercise modes such as yoga, pilates and tai chi.  Demonstration and skill practice provided.   Activity Barriers & Risk Stratification:  Activity Barriers & Cardiac Risk Stratification - 05/15/20 1310      Activity Barriers & Cardiac Risk Stratification   Activity Barriers Deconditioning;Muscular Weakness;Balance Concerns    Cardiac Risk Stratification High           6 Minute Walk:  6 Minute Walk    Row Name 05/15/20 1307         6 Minute Walk   Phase Initial     Distance 1235 feet     Walk Time 6 minutes     # of Rest Breaks 0     MPH 2.34     METS 3.1     RPE 8     VO2 Peak 10.84     Symptoms No     Resting HR 86 bpm     Resting BP 124/62     Resting Oxygen Saturation  98 %     Exercise Oxygen Saturation  during 6 min walk 99 %     Max Ex. HR 114 bpm     Max Ex. BP 130/64     2 Minute Post BP 134/70            Oxygen Initial Assessment:   Oxygen Re-Evaluation:   Oxygen Discharge (Final Oxygen Re-Evaluation):   Initial Exercise Prescription:  Initial Exercise Prescription - 05/15/20 1300      Date of Initial Exercise RX and Referring Provider   Date 05/15/20    Referring Provider Bartholome Bill MD      Treadmill   MPH 2.3    Grade 1    Minutes 15    METs 3.08      Elliptical   Level 1    Speed 2.7    Minutes 15    METs 3      REL-XR   Level 3    Speed 50    Minutes 15    METs 3      Prescription Details   Frequency (times per week) 2    Duration  Progress to 30 minutes of continuous aerobic without signs/symptoms of physical distress      Intensity   THRR 40-80% of Max Heartrate 115-144    Ratings of Perceived Exertion 11-13    Perceived Dyspnea 0-4      Progression   Progression Continue to progress workloads to maintain intensity without signs/symptoms of physical distress.      Resistance Training   Training Prescription Yes    Weight 3 lb    Reps 10-15           Perform Capillary Blood Glucose checks as needed.  Exercise Prescription Changes:  Exercise Prescription Changes    Row Name 05/15/20 1300 05/23/20 0700 05/30/20 0700 06/11/20 1700 06/26/20 0800     Response to Exercise   Blood Pressure (Admit) 124/62 -- 128/70 132/72 134/72   Blood Pressure (Exercise) 130/64 -- 114/54 140/68 134/70   Blood Pressure (Exit) 134/70 -- 118/72 130/70 118/64   Heart Rate (Admit) 86 bpm -- 82 bpm 84 bpm 76 bpm   Heart Rate (Exercise) 114 bpm -- 107 bpm 116 bpm 92 bpm   Heart Rate (Exit) 87 bpm -- 91 bpm 82 bpm 79 bpm   Oxygen Saturation (Admit) 98 % -- -- -- --   Oxygen Saturation (Exercise) 99 % -- -- -- --   Rating of Perceived Exertion (Exercise) 8 -- 12 15 13    Symptoms none -- none none none   Comments walk test results -- -- -- --   Duration -- -- Continue with 30 min of aerobic exercise without signs/symptoms of physical distress. Continue with 30 min of aerobic exercise without signs/symptoms of physical distress. Continue with 30 min of aerobic exercise without signs/symptoms of physical distress.   Intensity -- -- THRR unchanged THRR unchanged THRR unchanged     Progression   Progression -- -- Continue to progress workloads to maintain  intensity without signs/symptoms of physical distress. Continue to progress workloads to maintain intensity without signs/symptoms of physical distress. Continue to progress workloads to maintain intensity without signs/symptoms of physical distress.   Average METs -- -- 3.52 3.4 3.63     Resistance Training   Training Prescription -- -- Yes Yes Yes   Weight -- -- 3 lb 5 lb 6 lb   Reps -- -- 10-15 10-15 10-15     Interval Training   Interval Training -- -- No No No     Treadmill   MPH -- -- 2.5 2.5 2.7   Grade -- -- 1.5 1.5 2.5   Minutes -- -- 15 15 15    METs -- -- 3.43 3.43 4     Recumbant Elliptical   Level -- -- -- -- 2.3   Minutes -- -- -- -- 15   METs -- -- -- -- 2.7     Elliptical   Level -- -- 1 1 --   Speed -- -- 2.7 2.7 --   Minutes -- -- 5 4 --     REL-XR   Level -- -- 5 15 10    Minutes -- -- 15 15 15    METs -- -- 3.6 -- 4.2     Home Exercise Plan   Plans to continue exercise at -- Home (comment)  walking, bike, elliptical, weights Home (comment)  walking, bike, elliptical, weights Home (comment)  walking, bike, elliptical, weights Home (comment)  walking, bike, elliptical, weights   Frequency -- Add 3 additional days to program exercise sessions. Add 3 additional days to program exercise sessions.  Add 3 additional days to program exercise sessions. Add 3 additional days to program exercise sessions.   Initial Home Exercises Provided -- 05/23/20 05/23/20 -- 05/23/20   Row Name 07/09/20 1300             Response to Exercise   Blood Pressure (Admit) 138/72       Blood Pressure (Exercise) 150/68       Blood Pressure (Exit) 132/70       Heart Rate (Admit) 82 bpm       Heart Rate (Exercise) 114 bpm       Heart Rate (Exit) 85 bpm       Rating of Perceived Exertion (Exercise) 13       Symptoms none       Duration Continue with 30 min of aerobic exercise without signs/symptoms of physical distress.       Intensity THRR unchanged         Progression    Progression Continue to progress workloads to maintain intensity without signs/symptoms of physical distress.       Average METs 5.8         Resistance Training   Training Prescription Yes       Weight 6 lb       Reps 10-15         Interval Training   Interval Training No         Treadmill   MPH 2.8       Grade 2.5       Minutes 15       METs 4         REL-XR   Level 12       Minutes 15       METs 5.8         Home Exercise Plan   Plans to continue exercise at Home (comment)  walking, bike, elliptical, weights       Frequency Add 3 additional days to program exercise sessions.       Initial Home Exercises Provided 05/23/20              Exercise Comments:  Exercise Comments    Row Name 05/17/20 1497           Exercise Comments First full day of exercise!  Patient was oriented to gym and equipment including functions, settings, policies, and procedures.  Patient's individual exercise prescription and treatment plan were reviewed.  All starting workloads were established based on the results of the 6 minute walk test done at initial orientation visit.  The plan for exercise progression was also introduced and progression will be customized based on patient's performance and goals.              Exercise Goals and Review:  Exercise Goals    Row Name 05/15/20 1314             Exercise Goals   Increase Physical Activity Yes       Intervention Provide advice, education, support and counseling about physical activity/exercise needs.;Develop an individualized exercise prescription for aerobic and resistive training based on initial evaluation findings, risk stratification, comorbidities and participant's personal goals.       Expected Outcomes Short Term: Attend rehab on a regular basis to increase amount of physical activity.;Long Term: Add in home exercise to make exercise part of routine and to increase amount of physical activity.;Long Term: Exercising regularly at  least 3-5 days a week.  Increase Strength and Stamina Yes       Intervention Develop an individualized exercise prescription for aerobic and resistive training based on initial evaluation findings, risk stratification, comorbidities and participant's personal goals.;Provide advice, education, support and counseling about physical activity/exercise needs.       Expected Outcomes Short Term: Increase workloads from initial exercise prescription for resistance, speed, and METs.;Short Term: Perform resistance training exercises routinely during rehab and add in resistance training at home;Long Term: Improve cardiorespiratory fitness, muscular endurance and strength as measured by increased METs and functional capacity (6MWT)       Able to understand and use rate of perceived exertion (RPE) scale Yes       Intervention Provide education and explanation on how to use RPE scale       Expected Outcomes Short Term: Able to use RPE daily in rehab to express subjective intensity level;Long Term:  Able to use RPE to guide intensity level when exercising independently       Able to understand and use Dyspnea scale Yes       Intervention Provide education and explanation on how to use Dyspnea scale       Expected Outcomes Short Term: Able to use Dyspnea scale daily in rehab to express subjective sense of shortness of breath during exertion;Long Term: Able to use Dyspnea scale to guide intensity level when exercising independently       Knowledge and understanding of Target Heart Rate Range (THRR) Yes       Intervention Provide education and explanation of THRR including how the numbers were predicted and where they are located for reference       Expected Outcomes Short Term: Able to state/look up THRR;Short Term: Able to use daily as guideline for intensity in rehab;Long Term: Able to use THRR to govern intensity when exercising independently       Able to check pulse independently Yes       Intervention  Provide education and demonstration on how to check pulse in carotid and radial arteries.;Review the importance of being able to check your own pulse for safety during independent exercise       Expected Outcomes Short Term: Able to explain why pulse checking is important during independent exercise;Long Term: Able to check pulse independently and accurately       Understanding of Exercise Prescription Yes       Intervention Provide education, explanation, and written materials on patient's individual exercise prescription       Expected Outcomes Short Term: Able to explain program exercise prescription;Long Term: Able to explain home exercise prescription to exercise independently              Exercise Goals Re-Evaluation :  Exercise Goals Re-Evaluation    Row Name 05/17/20 0824 05/23/20 0736 05/29/20 0740 06/11/20 1718 06/14/20 0735     Exercise Goal Re-Evaluation   Exercise Goals Review Able to understand and use rate of perceived exertion (RPE) scale;Knowledge and understanding of Target Heart Rate Range (THRR);Able to understand and use Dyspnea scale;Understanding of Exercise Prescription Increase Physical Activity;Increase Strength and Stamina;Able to understand and use rate of perceived exertion (RPE) scale;Able to understand and use Dyspnea scale;Knowledge and understanding of Target Heart Rate Range (THRR);Understanding of Exercise Prescription;Able to check pulse independently Increase Physical Activity;Increase Strength and Stamina;Able to understand and use rate of perceived exertion (RPE) scale;Able to understand and use Dyspnea scale;Knowledge and understanding of Target Heart Rate Range (THRR);Understanding of Exercise Prescription;Able to check pulse independently  Increase Physical Activity;Increase Strength and Stamina;Able to understand and use rate of perceived exertion (RPE) scale Increase Physical Activity;Increase Strength and Stamina;Able to understand and use rate of perceived  exertion (RPE) scale   Comments Reviewed RPE and dyspnea scales, THR and program prescription with pt today.  Pt voiced understanding and was given a copy of goals to take home. Reviewed home exercise with pt today.  Pt plans to use stationary bike and elliptical at home for exercise.  He will also continue to use his weights and walk when at beach too.  Reviewed THR, pulse, RPE, sign and symptoms, pulse oximetery and when to call 911 or MD.  Also discussed weather considerations and indoor options.  Pt voiced understanding. Allen hasnt tried to exercise at home yet.  He has an ellipitcal and bike at home.  He hasnt been sore since starting to exercise. Aleen tried the elliptical and made it 4 minutes !  he has increase to 5 lb for strength work.  Staff will monitor progress. Cycling at home and ellipital - usually 40 minutes1-2x/week. Bought weights (10 pounds) - before his heart event and will work up to them.   Expected Outcomes Short: Use RPE daily to regulate intensity. Long: Follow program prescription in THR. Short: Continue to exercise on off days  Long: Continue to improve stamina. Short:  add in home exercise Long: improve stamina and MET level Short: build up to 15 min on elliptical Long: improve overall MET level ST: increase exercise at home 1 extra day. LT: improve MET level   Row Name 06/26/20 0833 07/09/20 1333           Exercise Goal Re-Evaluation   Exercise Goals Review Increase Physical Activity;Increase Strength and Stamina;Understanding of Exercise Prescription Increase Physical Activity;Increase Strength and Stamina;Understanding of Exercise Prescription      Comments Antony Haste has been doing well in rehab.  He is now up to level  2.3 on the recumbent elliptical!  We will continue to monitor his progress. Zenia Resides continues to progress well.  He is on level 12 on the XR and averaged 5.8 METs.  He continues to use 6 lb for strength work.  He has tried split squats during strength work for a  challenge.      Expected Outcomes Short: Talk about adding in intervals  Long: Continue to improve stamina. Short: add intervals Long: improve MET level             Discharge Exercise Prescription (Final Exercise Prescription Changes):  Exercise Prescription Changes - 07/09/20 1300      Response to Exercise   Blood Pressure (Admit) 138/72    Blood Pressure (Exercise) 150/68    Blood Pressure (Exit) 132/70    Heart Rate (Admit) 82 bpm    Heart Rate (Exercise) 114 bpm    Heart Rate (Exit) 85 bpm    Rating of Perceived Exertion (Exercise) 13    Symptoms none    Duration Continue with 30 min of aerobic exercise without signs/symptoms of physical distress.    Intensity THRR unchanged      Progression   Progression Continue to progress workloads to maintain intensity without signs/symptoms of physical distress.    Average METs 5.8      Resistance Training   Training Prescription Yes    Weight 6 lb    Reps 10-15      Interval Training   Interval Training No      Treadmill   MPH 2.8  Grade 2.5    Minutes 15    METs 4      REL-XR   Level 12    Minutes 15    METs 5.8      Home Exercise Plan   Plans to continue exercise at Home (comment)   walking, bike, elliptical, weights   Frequency Add 3 additional days to program exercise sessions.    Initial Home Exercises Provided 05/23/20           Nutrition:  Target Goals: Understanding of nutrition guidelines, daily intake of sodium <1578m, cholesterol <2058m calories 30% from fat and 7% or less from saturated fats, daily to have 5 or more servings of fruits and vegetables.  Education: Controlling Sodium/Reading Food Labels -Group verbal and written material supporting the discussion of sodium use in heart healthy nutrition. Review and explanation with models, verbal and written materials for utilization of the food label.   Education: General Nutrition Guidelines/Fats and Fiber: -Group instruction provided by verbal,  written material, models and posters to present the general guidelines for heart healthy nutrition. Gives an explanation and review of dietary fats and fiber.   [facility]  [facility]20  [facility] 1- Verbalizes Understanding  [need identified]      Biometrics:  Pre Biometrics - 05/15/20 1314      Pre Biometrics   Height 5' 9.5" (1.765 m)    Weight 221 lb 4.8 oz (100.4 kg)    BMI (Calculated) 32.22    Single Leg Stand 4.72 seconds            Nutrition Therapy Plan and Nutrition Goals:  Nutrition Therapy & Goals - 05/29/20 0817      Nutrition Therapy   Diet Heart healthy, Low Na, T2DM    Drug/Food Interactions Statins/Certain Fruits    Protein (specify units) 80g    Fiber 30 grams    Whole Grain Foods 3 servings    Saturated Fats 12 max. grams    Fruits and Vegetables 5 servings/day    Sodium 1.5 grams      Personal Nutrition Goals   Nutrition Goal ST: increase wate intake by 16oz per day  LT: Feel better 7/10 now - working on increasing energy    Comments Pt reports losing appetite and eating about half of his usual intake. B:OJ or low sugar grape juice and two pieces of toast and 2-3 slices of bacon (will not eat eggs). L: vary from meat with two vegetables to a sandwich. D: meat and two vegetables or sandwich or hamburger. S: small amount of ice cream. cooks vegetables steamed, baked, and grilled. Likes to eat salad with dark leafy greens as his salad base. Drinks elderberry juice. Eats a variety vegetables. Eats peanut butter and tries to eat more fruit. Tries to eat whole wheat bread, will sometimes have oatmeal. Pt reports high fat foods turn him off to eating and has now switched to 2% milk. sugar free mountain dew and unsweetened or half and half tea. Pt reports that blood sugar has been good, only had lows right after surgery - now at low 100s. No longer on glipizide anymore, now just on  trulicity and metformin. Pt report having rouble with drinking water, but wants to drink more.      Intervention Plan   Intervention Prescribe, educate and counsel regarding individualized specific dietary modifications aiming towards targeted core components such as weight, hypertension, lipid management,  diabetes, heart failure and other comorbidities.;Nutrition handout(s) given to patient.    Expected Outcomes Short Term Goal: Understand basic principles of dietary content, such as calories, fat, sodium, cholesterol and nutrients.;Short Term Goal: A plan has been developed with personal nutrition goals set during dietitian appointment.;Long Term Goal: Adherence to prescribed nutrition plan.           Nutrition Assessments:  Nutrition Assessments - 05/15/20 1326      MEDFICTS Scores   Pre Score 74           MEDIFICTS Score Key:          ?70 Need to make dietary changes          40-70 Heart Healthy Diet         ? 40 Therapeutic Level Cholesterol Diet  Nutrition Goals Re-Evaluation:  Nutrition Goals Re-Evaluation    [place] Name 06/14/20 0726             Goals   Nutrition Goal ST: increase water intake by 16oz per day  LT: Feel better 7/10 now - working on increasing energy       Comment Pt reports ST goal going "so so". He reports some days that he will drink more than others. Will still want to work towards this goal. Start bringing your water with you and to your exercise. No changes in diet aside from increased fruit and vegetable intake.       Expected Outcome ST: increase water intake by 16oz per day  LT: Feel better 7/10 now - working on increasing energy              Nutrition Goals Discharge (Final Nutrition Goals Re-Evaluation):  Nutrition Goals Re-Evaluation - 06/14/20 0726      Goals   Nutrition Goal ST: increase water intake by 16oz per day  LT: Feel better 7/10 now - working on increasing energy    Comment Pt reports ST goal going "so so". He reports some days  that he will drink more than others. Will still want to work towards this goal. Start bringing your water with you and to your exercise. No changes in diet aside from increased fruit and vegetable intake.    Expected Outcome ST: increase water intake by 16oz per day  LT: Feel better 7/10 now - working on increasing energy           Psychosocial: Target Goals: Acknowledge presence or absence of significant depression and/or stress, maximize coping skills, provide positive support system. Participant is able to verbalize types and ability to use techniques and skills needed for reducing stress and depression.   Education: Depression - Provides group verbal and written instruction on the correlation between heart/lung disease and depressed mood, treatment options, and the stigmas associated with seeking treatment.   Cardiac Rehab from 06/21/2020 in [facility] Cardiac and Pulmonary Rehab  Date 06/14/20  Educator [facility]  Instruction Review Code 1- Verbalizes Understanding      Education: Sleep Hygiene -Provides group verbal and written instruction about how sleep can affect your health.  Define sleep hygiene, discuss sleep cycles and impact of sleep habits. Review good sleep hygiene tips.     Education: Stress and Anxiety: - Provides group verbal and written instruction about the health risks of elevated stress and causes of high stress.  Discuss the correlation between heart/lung disease and anxiety and treatment options. Review healthy ways to manage with stress and anxiety.   Cardiac Rehab from 06/21/2020 in [facility] Cardiac  and Pulmonary Rehab  Date 06/14/20  Educator [facility]  Instruction Review Code 1- Verbalizes Understanding       Initial Review & Psychosocial Screening:  Initial Psych Review & Screening - 05/10/20 0939      Initial Review   Current issues with Current Stress Concerns    Source of Stress Concerns Family    Comments mother has dementia (living at [place])      [place]? Yes   wife, cousins that live near by (he's an only child)     Barriers   Psychosocial barriers to participate in program There are no identifiable barriers or psychosocial needs.      Screening Interventions   Interventions Encouraged to exercise;To provide support and resources with identified psychosocial needs;Provide feedback about the scores to participant    Expected Outcomes Short Term goal: Utilizing psychosocial counselor, staff and physician to assist with identification of specific Stressors or current issues interfering with healing process. Setting desired goal for each stressor or current issue identified.;Long Term Goal: Stressors or current issues are controlled or eliminated.;Short Term goal: Identification and review with participant of any Quality of Life or Depression concerns found by scoring the questionnaire.;Long Term goal: The participant improves quality of Life and PHQ9 Scores as seen by post scores and/or verbalization of changes           Quality of Life Scores:   Quality of Life - 05/15/20 1326      Quality of Life   Select Quality of Life      Quality of Life Scores   Health/Function Pre 29.57 %    Socioeconomic Pre 30 %    Psych/Spiritual Pre 30 %    Family Pre 30 %    GLOBAL Pre 29.82 %          Scores of 19 and below usually indicate a poorer quality of life in these areas.  A difference of  2-3 points is a clinically meaningful difference.  A difference of 2-3 points in the total score of the Quality of Life Index has been associated with significant improvement in overall quality of life, self-image, physical symptoms, and general health in studies assessing change in quality of life.  PHQ-9: Recent Review Flowsheet Data    Depression screen [facility] 2/9 05/15/2020 07/18/2019 04/01/2017 04/01/2017   Decreased Interest 0 0 0 0   Down, Depressed, Hopeless 0 0 0 0   PHQ - 2 Score 0 0 0 0   Altered sleeping 0 - 2 -   Tired,  decreased energy 0 - 1 -   Change in appetite 0 - 0 -   Feeling bad or failure about yourself  0 - 0 -   Trouble concentrating 0 - 0 -   Moving slowly or fidgety/restless 0 - 0 -   Suicidal thoughts 0 - 0 -   PHQ-9 Score 0 - 3 -   Difficult doing work/chores Not difficult at all - - -     Interpretation of Total Score  Total Score Depression Severity:  1-4 = Minimal depression, 5-9 = Mild depression, 10-14 = Moderate depression, 15-19 = Moderately severe depression, 20-27 = Severe depression   Psychosocial Evaluation and Intervention:  Psychosocial Evaluation - 05/10/20 0946      Psychosocial Evaluation & Interventions   Interventions Encouraged to exercise with the program and follow exercise prescription    Comments 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  He is a good spirited man. He is an only child but has a great support system with his wife and cousins that live nearby.  His mother is now living in [place] hall with dementia, which got worse while he was having to undergo surgery.  Overall, he is doing very well and eager to get going again.  He wants to get back to his normal and be able to walk on the beach again without having to stop to catch his breath.  He sleeps well at night and has no other major stressors currently.    Expected Outcomes Short: Attend rehab to build stamina  Long: Continue to cope well with recovery    Continue Psychosocial Services  Follow up required by staff           Psychosocial Re-Evaluation:  Psychosocial Re-Evaluation    [place] Name 05/29/20 0742 06/14/20 0728           Psychosocial Re-Evaluation   Current issues with Current Stress Concerns Current Stress Concerns      Comments Zenia Resides has no new stress concerns - says he has less stress since he got his taxes done. Zenia Resides has no new stress concerns - says he has less stress since he got his taxes mostly done and he is finishing them up now. He has planned travel for next year -  [place], [place], [place], [place]. He is still having stress with his mom, but she is doing better with her care. Two daughters he has that he has no relationship with because of their mother. Wife is his support system as well as his cousins. He reports fishing  and cycling to help manage stress. 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      Expected Outcomes Short: continue to exercise to manage stress Long: maintain positive outlook Short: continue to exercise to manage stress Long: maintain positive outlook      Comments -- mother has dementia (living at [place])        Initial Review   Source of Stress Concerns -- None Identified;Family             Psychosocial Discharge (Final Psychosocial Re-Evaluation):  Psychosocial Re-Evaluation - 06/14/20 0728      Psychosocial Re-Evaluation   Current issues with Current Stress Concerns    Comments Zenia Resides has no new stress concerns - says he has less stress since he got his taxes mostly done and he is finishing them up now. He has planned travel for next year - [place], [place], [place], [place]. He is still having stress with his mom, but she is doing better with her care. Two daughters he has that he has no relationship with because of their mother. Wife is his support system as well as his cousins. He reports fishing  and cycling to help manage stress. 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    Expected Outcomes Short: continue to exercise to manage stress Long: maintain positive outlook    Comments mother has dementia (living at [place])      Initial Review   Source of Stress Concerns None Identified;Family           Vocational Rehabilitation: Provide vocational rehab assistance to qualifying candidates.   Vocational Rehab Evaluation & Intervention:  Vocational Rehab - 05/10/20 3710      Initial Vocational Rehab Evaluation & Intervention   Assessment  shows need for Vocational Rehabilitation  No   retired          Education: Education Goals: Education classes will be provided on a variety of topics geared toward better understanding of heart health and risk factor modification. Participant will state understanding/return demonstration of topics presented as noted by education test scores.  Learning Barriers/Preferences:  Learning Barriers/Preferences - 05/10/20 9211      Learning Barriers/Preferences   Learning Barriers Sight   glasses for reading   Learning Preferences None           General Cardiac Education Topics:  AED/CPR: - Group verbal and written instruction with the use of models to demonstrate the basic use of the AED with the basic ABC's of resuscitation.   Anatomy & Physiology of the Heart: - Group verbal and written instruction and models provide basic cardiac anatomy and physiology, with the coronary electrical and arterial systems. Review of Valvular disease and Heart Failure   Cardiac Rehab from 05/23/2020 in [facility] Cardiac and Pulmonary Rehab  Date 05/15/20  Instruction Review Code 3- Needs Reinforcement  [need identified]      Cardiac Procedures: - Group verbal and written instruction to review commonly prescribed medications for heart disease. Reviews the medication, class of the drug, and side effects. Includes the steps to properly store meds and maintain the prescription regimen. (beta blockers and nitrates)   Cardiac Medications I: - Group verbal and written instruction to review commonly prescribed medications for heart disease. Reviews the medication, class of the drug, and side effects. Includes the steps to properly store meds and maintain the prescription regimen.   Cardiac Rehab from 05/23/2020 in [facility] Cardiac and Pulmonary Rehab  Date 05/23/20  Educator SB  Instruction Review Code 1- Verbalizes Understanding      Cardiac Medications II: -Group verbal and written instruction to review  commonly prescribed medications for heart disease. Reviews the medication, class of the drug, and side effects. (all other drug classes)    Go Sex-Intimacy & Heart Disease, Get SMART - Goal Setting: - Group verbal and written instruction through game format to discuss heart disease and the return to sexual intimacy. Provides group verbal and written material to discuss and apply goal setting through the application of the S.M.A.R.T. Method.   Other Matters of the Heart: - Provides group verbal, written materials and models to describe Stable Angina and Peripheral Artery. Includes description of the disease process and treatment options available to the cardiac patient.   Infection Prevention: - Provides verbal and written material to individual with discussion of infection control including proper hand washing and proper equipment cleaning during exercise session.   Cardiac Rehab from 05/23/2020 in [facility] Cardiac and Pulmonary Rehab  Date 05/15/20  Educator [facility]  Instruction Review Code 1- Verbalizes Understanding      Falls Prevention: - Provides verbal and written material to individual with discussion of falls prevention and safety.   Cardiac Rehab from 05/23/2020 in [facility] Cardiac and Pulmonary Rehab  Date 05/15/20  Educator [facility]  Instruction Review Code 1- Verbalizes Understanding      Other: -Provides group and verbal instruction on various topics (see comments)   Knowledge Questionnaire Score:  Knowledge Questionnaire Score - 05/15/20 1326      Knowledge Questionnaire Score   Pre Score 24/28 Education Focus: Angina, Nutrtition, Exercise           Core Components/Risk Factors/Patient Goals at Admission:  Personal Goals and Risk Factors at Admission - 05/15/20 1329      Core Components/Risk  Factors/Patient Goals on Admission    Weight Management Yes;Obesity;Weight Loss    Intervention Weight Management: Develop a combined nutrition and exercise program designed to reach  desired caloric intake, while maintaining appropriate intake of nutrient and fiber, sodium and fats, and appropriate energy expenditure required for the weight goal.;Weight Management: Provide education and appropriate resources to help participant work on and attain dietary goals.;Weight Management/Obesity: Establish reasonable short term and long term weight goals.;Obesity: Provide education and appropriate resources to help participant work on and attain dietary goals.    Admit Weight 221 lb 4.8 oz (100.4 kg)    Goal Weight: Short Term 215 lb (97.5 kg)    Goal Weight: Long Term 200 lb (90.7 kg)    Expected Outcomes Short Term: Continue to assess and modify interventions until short term weight is achieved;Long Term: Adherence to nutrition and physical activity/exercise program aimed toward attainment of established weight goal;Weight Loss: Understanding of general recommendations for a balanced deficit meal plan, which promotes 1-2 lb weight loss per week and includes a negative energy balance of [phone] kcal/d;Understanding recommendations for meals to include 15-35% energy as protein, 25-35% energy from fat, 35-60% energy from carbohydrates, less than 265m of dietary cholesterol, 20-35 gm of total fiber daily;Understanding of distribution of calorie intake throughout the day with the consumption of 4-5 meals/snacks    Diabetes Yes    Intervention Provide education about signs/symptoms and action to take for hypo/hyperglycemia.;Provide education about proper nutrition, including hydration, and aerobic/resistive exercise prescription along with prescribed medications to achieve blood glucose in normal ranges: Fasting glucose 65-99 mg/dL    Expected Outcomes Short Term: Participant verbalizes understanding of the signs/symptoms and immediate care of hyper/hypoglycemia, proper foot care and importance of medication, aerobic/resistive exercise and nutrition plan for blood glucose control.;Long Term:  Attainment of HbA1C < 7%.    Hypertension Yes    Intervention Provide education on lifestyle modifcations including regular physical activity/exercise, weight management, moderate sodium restriction and increased consumption of fresh fruit, vegetables, and low fat dairy, alcohol moderation, and smoking cessation.;Monitor prescription use compliance.    Expected Outcomes Short Term: Continued assessment and intervention until BP is < 140/968mHG in hypertensive participants. < 130/8031mG in hypertensive participants with diabetes, heart failure or chronic kidney disease.;Long Term: Maintenance of blood pressure at goal levels.    Lipids Yes    Intervention Provide education and support for participant on nutrition & aerobic/resistive exercise along with prescribed medications to achieve LDL <38m27mDL >40mg23m Expected Outcomes Short Term: Participant states understanding of desired cholesterol values and is compliant with medications prescribed. Participant is following exercise prescription and nutrition guidelines.;Long Term: Cholesterol controlled with medications as prescribed, with individualized exercise RX and with personalized nutrition plan. Value goals: LDL < 38mg,38m > 40 mg.           Education:Diabetes - Individual verbal and written instruction to review signs/symptoms of diabetes, desired ranges of glucose level fasting, after meals and with exercise. Acknowledge that pre and post exercise glucose checks will be done for 3 sessions at entry of program.   [facility]  Date 05/15/20  [facility] Code 1- Verbalizes Understanding      Education: Know Your Numbers and Risk Factors: -Group verbal and written instruction about important numbers in your health.  Discussion of what are risk factors and how they play a role in the disease process.  Review of Cholesterol,  Blood Pressure, Diabetes, and BMI and the  role they play in your overall health.   Core Components/Risk Factors/Patient Goals Review:   Goals and Risk Factor Review    Row Name 05/29/20 0739 05/31/20 0728 06/14/20 0733         Core Components/Risk Factors/Patient Goals Review   Personal Goals Review Weight Management/Obesity;Hypertension;Diabetes;Lipids Weight Management/Obesity;Hypertension;Diabetes;Lipids Weight Management/Obesity;Hypertension;Diabetes;Lipids     Review Allen checks his BG at home on days not at [facility].  Normally it runs around 110.  He reports taking meds as directed. -- Allen checks his BG at home on days not at [facility]. He reports his average is about 101 in am. He reports taking meds as directed.     Expected Outcomes Short: attend HT consistently Long: manage risk factors on his own -- Short: attend HT consistently Long: manage risk factors on his own            Core Components/Risk Factors/Patient Goals at Discharge (Final Review):   Goals and Risk Factor Review - 06/14/20 0733      Core Components/Risk Factors/Patient Goals Review   Personal Goals Review Weight Management/Obesity;Hypertension;Diabetes;Lipids    Review Allen checks his BG at home on days not at [facility]. He reports his average is about 101 in am. He reports taking meds as directed.    Expected Outcomes Short: attend HT consistently Long: manage risk factors on his own           ITP Comments:  ITP Comments    Row Name 05/10/20 0949 05/15/20 1304 05/16/20 1624 05/17/20 0824 06/13/20 0613   ITP Comments Completed virtual orientation today.  EP evaluation is scheduled for Tues 9/7 at 9am.  Documentation for diagnosis can be found in [facility] encounter 04/04/20. Completed 6MWT and gym orientation. Initial ITP created and sent for review to Dr. Emily Filbert, Medical Director. 30 day review completed. ITP sent to Dr. Emily Filbert, Medical Director of Cardiac and Pulmonary Rehab. Continue with ITP unless changes are made by physician. First full day of exercise!   Patient was oriented to gym and equipment including functions, settings, policies, and procedures.  Patient's individual exercise prescription and treatment plan were reviewed.  All starting workloads were established based on the results of the 6 minute walk test done at initial orientation visit.  The plan for exercise progression was also introduced and progression will be customized based on patient's performance and goals. 30 Day review completed. Medical Director ITP review done, changes made as directed, and signed approval by Medical Director.   [place] Name 07/11/20 0713           ITP Comments 30 Day review completed. Medical Director ITP review done, changes made as directed, and signed approval by Medical Director.              Comments:

## 2020-07-12 ENCOUNTER — Encounter: Payer: 59 | Admitting: *Deleted

## 2020-07-12 ENCOUNTER — Other Ambulatory Visit: Payer: Self-pay

## 2020-07-12 DIAGNOSIS — Z951 Presence of aortocoronary bypass graft: Secondary | ICD-10-CM

## 2020-07-12 NOTE — Progress Notes (Signed)
Daily Session Note  Patient Details  Name: Reginald Murphy MRN: [id number] Date of Birth: 02-Nov-1955 Referring Provider:     Cardiac Rehab from 05/15/2020 in [facility] Cardiac and Pulmonary Rehab  Referring Provider Bartholome Bill MD      Encounter Date: 07/12/2020  Check In:  Session Check In - 07/12/20 0858      Check-In   Supervising physician immediately available to respond to emergencies See telemetry face sheet for immediately available ER MD    Location [facility]-Cardiac & Pulmonary Rehab    Staff Present Heath Lark, RN, BSN, CCRP;Jessica Athens, MA, RCEP, CCRP, [place], [place], ACSM CEP, Exercise Physiologist    Virtual Visit No    Medication changes reported     No    Fall or balance concerns reported    No    Warm-up and Cool-down Performed on first and last piece of equipment    Resistance Training Performed Yes    VAD Patient? No    PAD/SET Patient? No      Pain Assessment   Currently in Pain? No/denies              Social History   Tobacco Use  Smoking Status Never Smoker  Smokeless Tobacco Never Used    Goals Met:  Independence with exercise equipment Exercise tolerated well No report of cardiac concerns or symptoms  Goals Unmet:  Not Applicable  Comments: Pt able to follow exercise prescription today without complaint.  Will continue to monitor for progression.    Dr. Emily Filbert is Medical Director for [place] and [facility].

## 2020-07-24 ENCOUNTER — Encounter: Payer: 59 | Admitting: *Deleted

## 2020-07-24 DIAGNOSIS — Z951 Presence of aortocoronary bypass graft: Secondary | ICD-10-CM

## 2020-07-24 NOTE — Progress Notes (Signed)
Daily Session Note  Patient Details  Name: Reginald Murphy MRN: [id number] Date of Birth: [birth year] Referring Provider:     Cardiac Rehab from 05/15/2020 in [facility] Cardiac and Pulmonary Rehab  Referring Provider Bartholome Bill MD      Encounter Date: 07/24/2020  Check In:  Session Check In - 07/24/20 0817      Check-In   Supervising physician immediately available to respond to emergencies See telemetry face sheet for immediately available ER MD    Location [facility]-Cardiac & Pulmonary Rehab    Staff Present Heath Lark, RN, BSN, Jacklynn Bue, MS Exercise Physiologist;Amanda Oletta Darter, [place], ACSM CEP, Exercise Physiologist    Virtual Visit No    Medication changes reported     No    Fall or balance concerns reported    No    Warm-up and Cool-down Performed on first and last piece of equipment    Resistance Training Performed Yes    VAD Patient? No    PAD/SET Patient? No      Pain Assessment   Currently in Pain? No/denies              Social History   Tobacco Use  Smoking Status Never Smoker  Smokeless Tobacco Never Used    Goals Met:  Independence with exercise equipment Exercise tolerated well No report of cardiac concerns or symptoms  Goals Unmet:  Not Applicable  Comments: Pt able to follow exercise prescription today without complaint.  Will continue to monitor for progression.    Dr. Emily Filbert is Medical Director for [place] and [facility].

## 2020-07-26 ENCOUNTER — Other Ambulatory Visit: Payer: Self-pay

## 2020-07-26 DIAGNOSIS — Z951 Presence of aortocoronary bypass graft: Secondary | ICD-10-CM | POA: Diagnosis not present

## 2020-07-26 NOTE — Progress Notes (Signed)
Daily Session Note  Patient Details  Name: Reginald Murphy MRN: [id number] Date of Birth: 27-Jul-1956 Referring Provider:     Cardiac Rehab from 05/15/2020 in [facility] Cardiac and Pulmonary Rehab  Referring Provider Bartholome Bill MD      Encounter Date: 07/26/2020  Check In:  Session Check In - 07/26/20 0711      Check-In   Supervising physician immediately available to respond to emergencies See telemetry face sheet for immediately available ER MD    Location [facility]-Cardiac & Pulmonary Rehab    Staff Present Birdie Sons, MPA, RN;Melissa Caiola RDN, Rowe Pavy, BA, ACSM CEP, Exercise Physiologist    Virtual Visit No    Medication changes reported     No    Fall or balance concerns reported    No    Warm-up and Cool-down Performed on first and last piece of equipment    Resistance Training Performed Yes    VAD Patient? No    PAD/SET Patient? No      Pain Assessment   Currently in Pain? No/denies              Social History   Tobacco Use  Smoking Status Never Smoker  Smokeless Tobacco Never Used    Goals Met:  Independence with exercise equipment Exercise tolerated well No report of cardiac concerns or symptoms Strength training completed today  Goals Unmet:  Not Applicable  Comments: Pt able to follow exercise prescription today without complaint.  Will continue to monitor for progression.    Dr. Emily Filbert is Medical Director for [place] and [facility].

## 2020-07-31 ENCOUNTER — Other Ambulatory Visit: Payer: Self-pay

## 2020-07-31 ENCOUNTER — Encounter: Payer: 59 | Admitting: *Deleted

## 2020-07-31 DIAGNOSIS — Z951 Presence of aortocoronary bypass graft: Secondary | ICD-10-CM | POA: Diagnosis not present

## 2020-07-31 NOTE — Progress Notes (Signed)
Daily Session Note  Patient Details  Name: INGVALD THEISEN MRN: [id number] Date of Birth: [birth year] Referring Provider:     Cardiac Rehab from 05/15/2020 in [facility] Cardiac and Pulmonary Rehab  Referring Provider Bartholome Bill MD      Encounter Date: 07/31/2020  Check In:  Session Check In - 07/31/20 0745      Check-In   Supervising physician immediately available to respond to emergencies See telemetry face sheet for immediately available ER MD    Location [facility]-Cardiac & Pulmonary Rehab    Staff Present Heath Lark, RN, BSN, Lance Sell, BA, ACSM CEP, Exercise Physiologist;Kara Eliezer Bottom, MS Exercise Physiologist    Virtual Visit No    Medication changes reported     No    Fall or balance concerns reported    No    Warm-up and Cool-down Performed on first and last piece of equipment    Resistance Training Performed Yes    VAD Patient? No    PAD/SET Patient? No      Pain Assessment   Currently in Pain? No/denies              Social History   Tobacco Use  Smoking Status Never Smoker  Smokeless Tobacco Never Used    Goals Met:  Independence with exercise equipment Exercise tolerated well No report of cardiac concerns or symptoms  Goals Unmet:  Not Applicable  Comments: Pt able to follow exercise prescription today without complaint.  Will continue to monitor for progression.    Dr. Emily Filbert is Medical Director for [place] and [facility].

## 2020-08-06 NOTE — Progress Notes (Signed)
I,[name],acting as a scribe for [name], MD.,have documented all relevant documentation on the behalf of [name], MD,as directed by  [name], MD while in the presence of [name], MD.   Complete physical exam   Patient: Reginald Murphy   DOB: 07/14/1956   64 y.o. Male  MRN: [id number] Visit Date: 08/07/2020  Today's healthcare provider: [name], MD   Chief Complaint  Patient presents with  . Annual Exam   Subjective    Reginald Murphy is a 63 y.o. male who presents today for a complete physical exam.  He reports consuming a general diet. Home exercise routine includes re-hab, walking. He generally feels well. He reports sleeping well. He does have additional problems to discuss today.  Doing well and going to cardiac rehab regularly HPI    Past Medical History:  Diagnosis Date  . Diabetes mellitus without complication ([place])   . Hypertension    Past Surgical History:  Procedure Laterality Date  . LEFT HEART CATH AND CORONARY ANGIOGRAPHY Left 03/27/2020   Procedure: LEFT HEART CATH AND CORONARY ANGIOGRAPHY;  Surgeon: Teodoro Spray, MD;  Location: [facility];  Service: Cardiovascular;  Laterality: Left;   Social History   Socioeconomic History  . Marital status: Married    Spouse name: Juliann Pulse   . Number of children: 2  . Years of education: Not on file  . Highest education level: Not on file  Occupational History  . Not on file  Tobacco Use  . Smoking status: Never Smoker  . Smokeless tobacco: Never Used  Vaping Use  . Vaping Use: Never used  Substance and Sexual Activity  . Alcohol use: Yes    Alcohol/week: 0.0 standard drinks    Comment: occasionally  . Drug use: No  . Sexual activity: Not on file  Other Topics Concern  . Not on file  Social History Narrative  . Not on file   Social Determinants of Health   Financial Resource Strain:   . Difficulty of Paying Living Expenses: Not on  file  Food Insecurity:   . Worried About Charity fundraiser in the Last Year: Not on file  . Ran Out of Food in the Last Year: Not on file  Transportation Needs:   . Lack of Transportation (Medical): Not on file  . Lack of Transportation (Non-Medical): Not on file  Physical Activity:   . Days of Exercise per Week: Not on file  . Minutes of Exercise per Session: Not on file  Stress:   . Feeling of Stress : Not on file  Social Connections:   . Frequency of Communication with Friends and Family: Not on file  . Frequency of Social Gatherings with Friends and Family: Not on file  . Attends Religious Services: Not on file  . Active Member of Clubs or Organizations: Not on file  . Attends Archivist Meetings: Not on file  . Marital Status: Not on file  Intimate Partner Violence:   . Fear of Current or Ex-Partner: Not on file  . Emotionally Abused: Not on file  . Physically Abused: Not on file  . Sexually Abused: Not on file   Family Status  Relation Name Status  . Mother  Alive  . Father  Alive  . MGM  (Not Specified)  . MGF  (Not Specified)   Family History  Problem Relation Age of Onset  . Diabetes Mother   .  Seizures Father   . Heart attack Maternal Grandmother   . Cancer Maternal Grandfather    No Known Allergies  Patient Care Team: Jerrol Banana., MD as PCP - General (Family Medicine) Jerrol Banana., MD (Family Medicine) Bary Castilla Forest Gleason, MD (General Surgery)   Medications: Outpatient Medications Prior to Visit  Medication Sig  . ACCU-CHEK GUIDE test strip Please specify directions, refills and quantity  . amLODipine (NORVASC) 5 MG tablet TAKE 1 TABLET BY MOUTH EVERY DAY  . aspirin EC 81 MG tablet Take 81 mg by mouth daily.   Marland Kitchen atorvastatin (LIPITOR) 40 MG tablet Take 40 mg by mouth at bedtime.  . Blood Glucose Monitoring Suppl (ACCU-CHEK NANO SMARTVIEW) w/Device KIT 1 strip by Does not apply route daily.  Marland Kitchen glucose blood test strip 1  each by Other route as needed for other. Use as instructed  . lisinopril (ZESTRIL) 40 MG tablet TAKE 1 TABLET (40 MG TOTAL) BY MOUTH DAILY.  . metFORMIN (GLUCOPHAGE) 1000 MG tablet TAKE 1 TABLET BY MOUTH 2 TIMES DAILY WITH A MEAL.  . metoprolol tartrate (LOPRESSOR) 25 MG tablet Take by mouth.  . nitroGLYCERIN (NITROSTAT) 0.4 MG SL tablet SMARTSIG:1 Tablet(s) Sublingual PRN  . tadalafil (CIALIS) 20 MG tablet Take 1 tablet (20 mg total) by mouth every three (3) days as needed for erectile dysfunction.  . TRULICITY 5.46 TK/3.5WS SOPN SMARTSIG:0.5 Milliliter(s) SUB-Q Once a Week  . albuterol (VENTOLIN HFA) 108 (90 Base) MCG/ACT inhaler TAKE 2 PUFFS BY MOUTH EVERY 6 HOURS AS NEEDED FOR WHEEZE OR SHORTNESS OF BREATH (Patient not taking: Reported on 05/02/2020)  . glipiZIDE (GLUCOTROL XL) 10 MG 24 hr tablet Take by mouth. (Patient not taking: Reported on 05/10/2020)  . ONETOUCH DELICA LANCETS 56C MISC 2 (two) times daily. for testing  . SYMBICORT 80-4.5 MCG/ACT inhaler TAKE 2 PUFFS BY MOUTH TWICE A DAY (Patient not taking: Reported on 05/02/2020)   No facility-administered medications prior to visit.    Review of Systems  All other systems reviewed and are negative.      Objective    BP (!) 161/96 (BP Location: Left Arm, Patient Position: Sitting, Cuff Size: Large)   Pulse 69   Temp 97.9 F (36.6 C) (Oral)   Resp 16   Ht 5' 10"  (1.778 m)   Wt 232 lb (105.2 kg)   SpO2 100%   BMI 33.29 kg/m  BP Readings from Last 3 Encounters:  08/07/20 (!) 161/96  05/02/20 107/71  03/27/20 (!) 156/88   Wt Readings from Last 3 Encounters:  08/07/20 232 lb (105.2 kg)  05/15/20 221 lb 4.8 oz (100.4 kg)  05/02/20 224 lb 6.4 oz (101.8 kg)      Physical Exam Vitals reviewed.  Constitutional:      Appearance: He is well-developed.  HENT:     Head: Normocephalic and atraumatic.     Right Ear: External ear normal.     Left Ear: External ear normal.     Nose: Nose normal.  Eyes:      Conjunctiva/sclera: Conjunctivae normal.     Pupils: Pupils are equal, round, and reactive to light.  Cardiovascular:     Rate and Rhythm: Normal rate and regular rhythm.     Heart sounds: Normal heart sounds.     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 Pulmonary:     Effort: Pulmonary effort is normal.     Breath sounds: Normal breath sounds.  Abdominal:  General: Bowel sounds are normal.     Palpations: Abdomen is soft.  Genitourinary:    Penis: Normal.      Prostate: Normal.     Rectum: Normal.  Musculoskeletal:     Cervical back: Normal range of motion and neck supple.     Right lower leg: No edema.     Left lower leg: No edema.  Skin:    General: Skin is warm and dry.  Neurological:     General: No focal deficit present.     Mental Status: He is alert and oriented to person, place, and time.     Deep Tendon Reflexes: Reflexes are normal and symmetric.  Psychiatric:        Mood and Affect: Mood normal.        Behavior: Behavior normal.        Thought Content: Thought content normal.        Judgment: Judgment normal.       Last depression screening scores PHQ 2/9 Scores 08/07/2020 05/15/2020 07/18/2019  PHQ - 2 Score 0 0 0  PHQ- 9 Score 0 0 -   Last fall risk screening Fall Risk  05/10/2020  Falls in the past year? 0  Number falls in past yr: 0  Injury with Fall? 0  Risk for fall due to : No Fall Risks  Follow up Falls prevention discussed   Last Audit-C alcohol use screening Alcohol Use Disorder Test (AUDIT) 08/07/2020  1. How often do you have a drink containing alcohol? 2  2. How many drinks containing alcohol do you have on a typical day when you are drinking? 0  3. How often do you have six or more drinks on one occasion? 1  AUDIT-C Score 3  4. How often during the last year have you found that you were not able to stop drinking once you had started? 0  5. How often during the last year have you failed to do what was normally expected  from you because of drinking? 0  6. How often during the last year have you needed a first drink in the morning to get yourself going after a heavy drinking session? 0  7. How often during the last year have you had a feeling of guilt of remorse after drinking? 0  8. How often during the last year have you been unable to remember what happened the night before because you had been drinking? 0  9. Have you or someone else been injured as a result of your drinking? 0  10. Has a relative or friend or a doctor or another health worker been concerned about your drinking or suggested you cut down? 0  Alcohol Use Disorder Identification Test Final Score (AUDIT) 3  Alcohol Brief Interventions/Follow-up AUDIT Score <7 follow-up not indicated   A score of 3 or more in women, and 4 or more in men indicates increased risk for alcohol abuse, EXCEPT if all of the points are from question 1   No results found for any visits on 08/07/20.  Assessment & Plan    Routine Health Maintenance and Physical Exam  Exercise Activities and Dietary recommendations Goals   None     Immunization History  Administered Date(s) Administered  . Hepatitis B, adult 06/09/2016  . Pneumococcal Polysaccharide-23 06/09/2016  . Tdap 07/04/2014    Health Maintenance  Topic Date Due  . Hepatitis C Screening  Never done  . COVID-19 Vaccine (1) Never done  . HIV Screening  Never done  . COLONOSCOPY  Never done  . FOOT EXAM  02/23/2019  . INFLUENZA VACCINE  Never done  . OPHTHALMOLOGY EXAM  07/19/2020  . HEMOGLOBIN A1C  11/20/2020  . TETANUS/TDAP  07/04/2024  . PNEUMOCOCCAL POLYSACCHARIDE VACCINE AGE 59-64 HIGH RISK  Completed    Discussed health benefits of physical activity, and encouraged him to engage in regular exercise appropriate for his age and condition.  1. Annual physical exam  - Lipid panel - TSH - CBC w/Diff/Platelet - Comprehensive Metabolic Panel (CMET) - PSA  2. Diabetes mellitus without  complication (HCC)  - Hemoglobin A1c - Lipid panel - TSH - CBC w/Diff/Platelet - Comprehensive Metabolic Panel (CMET) - POCT UA - Microalbumin--50  3. Hyperlipidemia associated with type 2 diabetes mellitus (HCC)  - Lipid panel - TSH - CBC w/Diff/Platelet - Comprehensive Metabolic Panel (CMET)  4. Essential (primary) hypertension  - Lipid panel - TSH - CBC w/Diff/Platelet - Comprehensive Metabolic Panel (CMET)  5. Screening for prostate cancer   6. Screening for blood or protein in urine  - POCT urinalysis dipstick--Negative  7. Screening for colon cancer  - Ambulatory referral to Gastroenterology  8. Encounter for screening fecal occult blood testing  - IFOBT POC (occult bld, rslt in office)--Negative 9.  Status post CABG x3  No follow-ups on file.        Lyrik Dockstader Cranford Mon, MD  [facility] [phone] (phone) [phone] (fax)  [place]

## 2020-08-07 ENCOUNTER — Encounter: Payer: Self-pay | Admitting: Family Medicine

## 2020-08-07 ENCOUNTER — Ambulatory Visit (INDEPENDENT_AMBULATORY_CARE_PROVIDER_SITE_OTHER): Payer: 59 | Admitting: Family Medicine

## 2020-08-07 ENCOUNTER — Ambulatory Visit: Payer: 59

## 2020-08-07 ENCOUNTER — Other Ambulatory Visit: Payer: Self-pay

## 2020-08-07 VITALS — BP 161/96 | HR 69 | Temp 97.9°F | Resp 16 | Ht 70.0 in | Wt 232.0 lb

## 2020-08-07 DIAGNOSIS — E119 Type 2 diabetes mellitus without complications: Secondary | ICD-10-CM | POA: Diagnosis not present

## 2020-08-07 DIAGNOSIS — Z1389 Encounter for screening for other disorder: Secondary | ICD-10-CM

## 2020-08-07 DIAGNOSIS — E1169 Type 2 diabetes mellitus with other specified complication: Secondary | ICD-10-CM

## 2020-08-07 DIAGNOSIS — Z0000 Encounter for general adult medical examination without abnormal findings: Secondary | ICD-10-CM | POA: Diagnosis not present

## 2020-08-07 DIAGNOSIS — Z125 Encounter for screening for malignant neoplasm of prostate: Secondary | ICD-10-CM

## 2020-08-07 DIAGNOSIS — I10 Essential (primary) hypertension: Secondary | ICD-10-CM | POA: Diagnosis not present

## 2020-08-07 DIAGNOSIS — Z951 Presence of aortocoronary bypass graft: Secondary | ICD-10-CM

## 2020-08-07 DIAGNOSIS — Z1211 Encounter for screening for malignant neoplasm of colon: Secondary | ICD-10-CM

## 2020-08-07 DIAGNOSIS — E785 Hyperlipidemia, unspecified: Secondary | ICD-10-CM

## 2020-08-07 LAB — POCT URINALYSIS DIPSTICK
Appearance: NORMAL
Bilirubin, UA: NEGATIVE
Blood, UA: NEGATIVE
Glucose, UA: NEGATIVE
Ketones, UA: NEGATIVE
Leukocytes, UA: NEGATIVE
Nitrite, UA: NEGATIVE
Odor: NORMAL
Protein, UA: NEGATIVE
Spec Grav, UA: 1.01 (ref 1.010–1.025)
Urobilinogen, UA: 0.2 E.U./dL
pH, UA: 6 (ref 5.0–8.0)

## 2020-08-07 LAB — IFOBT (OCCULT BLOOD): IFOBT: NEGATIVE

## 2020-08-07 LAB — POCT UA - MICROALBUMIN: Microalbumin Ur, POC: 50 mg/L

## 2020-08-08 ENCOUNTER — Encounter: Payer: Self-pay | Admitting: *Deleted

## 2020-08-08 DIAGNOSIS — Z951 Presence of aortocoronary bypass graft: Secondary | ICD-10-CM

## 2020-08-08 LAB — LIPID PANEL
Chol/HDL Ratio: 3.3 ratio (ref 0.0–5.0)
Cholesterol, Total: 102 mg/dL (ref 100–199)
HDL: 31 mg/dL — ABNORMAL LOW (ref >39)
LDL Chol Calc (NIH): 40 mg/dL (ref 0–99)
Triglycerides: 188 mg/dL — ABNORMAL HIGH (ref 0–149)
VLDL Cholesterol Cal: 31 mg/dL (ref 5–40)

## 2020-08-08 LAB — CBC WITH DIFFERENTIAL/PLATELET
Basophils Absolute: 0 10*3/uL (ref 0.0–0.2)
Basos: 0 %
EOS (ABSOLUTE): 0.3 10*3/uL (ref 0.0–0.4)
Eos: 4 %
Hematocrit: 44.4 % (ref 37.5–51.0)
Hemoglobin: 15.5 g/dL (ref 13.0–17.7)
Immature Grans (Abs): 0 10*3/uL (ref 0.0–0.1)
Immature Granulocytes: 0 %
Lymphocytes Absolute: 2.6 10*3/uL (ref 0.7–3.1)
Lymphs: 34 %
MCH: 29.4 pg (ref 26.6–33.0)
MCHC: 34.9 g/dL (ref 31.5–35.7)
MCV: 84 fL (ref 79–97)
Monocytes Absolute: 0.6 10*3/uL (ref 0.1–0.9)
Monocytes: 8 %
Neutrophils Absolute: 4.2 10*3/uL (ref 1.4–7.0)
Neutrophils: 54 %
Platelets: 191 10*3/uL (ref 150–450)
RBC: 5.28 x10E6/uL (ref 4.14–5.80)
RDW: 12.5 % (ref 11.6–15.4)
WBC: 7.8 10*3/uL (ref 3.4–10.8)

## 2020-08-08 LAB — COMPREHENSIVE METABOLIC PANEL
ALT: 29 IU/L (ref 0–44)
AST: 19 IU/L (ref 0–40)
Albumin/Globulin Ratio: 2.2 (ref 1.2–2.2)
Albumin: 4.8 g/dL (ref 3.8–4.8)
Alkaline Phosphatase: 90 IU/L (ref 44–121)
BUN/Creatinine Ratio: 16 (ref 10–24)
BUN: 16 mg/dL (ref 8–27)
Bilirubin Total: 0.5 mg/dL (ref 0.0–1.2)
CO2: 24 mmol/L (ref 20–29)
Calcium: 9.9 mg/dL (ref 8.6–10.2)
Chloride: 100 mmol/L (ref 96–106)
Creatinine, Ser: 1.03 mg/dL (ref 0.76–1.27)
GFR calc Af Amer: 90 mL/min/{1.73_m2} (ref >59)
GFR calc non Af Amer: 77 mL/min/{1.73_m2} (ref >59)
Globulin, Total: 2.2 g/dL (ref 1.5–4.5)
Glucose: 108 mg/dL — ABNORMAL HIGH (ref 65–99)
Potassium: 4.6 mmol/L (ref 3.5–5.2)
Sodium: 139 mmol/L (ref 134–144)
Total Protein: 7 g/dL (ref 6.0–8.5)

## 2020-08-08 LAB — HEMOGLOBIN A1C
Est. average glucose Bld gHb Est-mCnc: 131 mg/dL
Hgb A1c MFr Bld: 6.2 % — ABNORMAL HIGH (ref 4.8–5.6)

## 2020-08-08 LAB — TSH: TSH: 3.04 u[IU]/mL (ref 0.450–4.500)

## 2020-08-08 LAB — PSA: Prostate Specific Ag, Serum: 0.5 ng/mL (ref 0.0–4.0)

## 2020-08-08 NOTE — Progress Notes (Signed)
Cardiac Individual Treatment Plan  Patient Details  Name: Reginald Murphy MRN: [id number] Date of Birth: October 25, 1955 Referring Provider:     Cardiac Rehab from 05/15/2020 in [facility] Cardiac and Pulmonary Rehab  Referring Provider Bartholome Bill MD      Initial Encounter Date:    Cardiac Rehab from 05/15/2020 in [facility] Cardiac and Pulmonary Rehab  Date 05/15/20      Visit Diagnosis: S/P CABG x 3  Patient's Home Medications on Admission:  Current Outpatient Medications:  .  ACCU-CHEK GUIDE test strip, Please specify directions, refills and quantity, Disp: 100 each, Rfl: 11 .  albuterol (VENTOLIN HFA) 108 (90 Base) MCG/ACT inhaler, TAKE 2 PUFFS BY MOUTH EVERY 6 HOURS AS NEEDED FOR WHEEZE OR SHORTNESS OF BREATH (Patient not taking: Reported on 05/02/2020), Disp: 18 g, Rfl: 2 .  amLODipine (NORVASC) 5 MG tablet, TAKE 1 TABLET BY MOUTH EVERY DAY, Disp: 90 tablet, Rfl: 0 .  aspirin EC 81 MG tablet, Take 81 mg by mouth daily. , Disp: , Rfl:  .  atorvastatin (LIPITOR) 40 MG tablet, Take 40 mg by mouth at bedtime., Disp: , Rfl:  .  Blood Glucose Monitoring Suppl (ACCU-CHEK NANO SMARTVIEW) w/Device KIT, 1 strip by Does not apply route daily., Disp: 1 kit, Rfl: 0 .  glipiZIDE (GLUCOTROL XL) 10 MG 24 hr tablet, Take by mouth. (Patient not taking: Reported on 05/10/2020), Disp: , Rfl:  .  glucose blood test strip, 1 each by Other route as needed for other. Use as instructed, Disp: , Rfl:  .  lisinopril (ZESTRIL) 40 MG tablet, TAKE 1 TABLET (40 MG TOTAL) BY MOUTH DAILY., Disp: 90 tablet, Rfl: 3 .  metFORMIN (GLUCOPHAGE) 1000 MG tablet, TAKE 1 TABLET BY MOUTH 2 TIMES DAILY WITH A MEAL., Disp: 180 tablet, Rfl: 0 .  metoprolol tartrate (LOPRESSOR) 25 MG tablet, Take by mouth., Disp: , Rfl:  .  nitroGLYCERIN (NITROSTAT) 0.4 MG SL tablet, SMARTSIG:1 Tablet(s) Sublingual PRN, Disp: , Rfl:  .  ONETOUCH DELICA LANCETS 59R MISC, 2 (two) times daily. for testing, Disp: , Rfl: 12 .  SYMBICORT 80-4.5 MCG/ACT inhaler, TAKE 2  PUFFS BY MOUTH TWICE A DAY (Patient not taking: Reported on 05/02/2020), Disp: 30.6 Inhaler, Rfl: 1 .  tadalafil (CIALIS) 20 MG tablet, Take 1 tablet (20 mg total) by mouth every three (3) days as needed for erectile dysfunction., Disp: 30 tablet, Rfl: 5 .  TRULICITY 4.16 LA/4.5XM SOPN, SMARTSIG:0.5 Milliliter(s) SUB-Q Once a Week, Disp: 12 mL, Rfl: 3  Past Medical History: Past Medical History:  Diagnosis Date  . Diabetes mellitus without complication ([place])   . Hypertension     Tobacco Use: Social History   Tobacco Use  Smoking Status Never Smoker  Smokeless Tobacco Never Used    Labs: Recent Review Flowsheet Data    Labs for ITP Cardiac and Pulmonary Rehab Latest Ref Rng & Units 03/03/2019 07/18/2019 11/14/2019 05/23/2020 08/07/2020   Cholestrol 100 - 199 mg/dL 176 - - - 102   LDLCALC 0 - 99 mg/dL 80 - - - 40   HDL >39 mg/dL 30(L) - - - 31(L)   Trlycerides 0 - 149 mg/dL 329(H) - - - 188(H)   Hemoglobin A1c 4.8 - 5.6 % 7.0(A) 7.0(A) 7.3(A) 5.9(A) 6.2(H)       Exercise Target Goals: Exercise Program Goal: Individual exercise prescription set using results from initial 6 min walk test and THRR while considering  patient's activity barriers and safety.   Exercise Prescription Goal: Initial exercise prescription  builds to 30-45 minutes a day of aerobic activity, 2-3 days per week.  Home exercise guidelines will be given to patient during program as part of exercise prescription that the participant will acknowledge.   Education: Aerobic Exercise: - Group verbal and visual presentation on the components of exercise prescription. Introduces F.I.T.T principle from ACSM for exercise prescriptions.  Reviews F.I.T.T. principles of aerobic exercise including progression. Written material given at graduation.   Education: Resistance Exercise: - Group verbal and visual presentation on the components of exercise prescription. Introduces F.I.T.T principle from ACSM for exercise prescriptions   Reviews F.I.T.T. principles of resistance exercise including progression. Written material given at graduation.    Education: Exercise & Equipment Safety: - Individual verbal instruction and demonstration of equipment use and safety with use of the equipment.   Cardiac Rehab from 05/23/2020 in [facility] Cardiac and Pulmonary Rehab  Date 05/15/20  Educator [facility]  Instruction Review Code 1- Verbalizes Understanding      Education: Exercise Physiology & General Exercise Guidelines: - Group verbal and written instruction with models to review the exercise physiology of the cardiovascular system and associated critical values. Provides general exercise guidelines with specific guidelines to those with heart or lung disease.    Cardiac Rehab from 07/31/2020 in [facility] Cardiac and Pulmonary Rehab  Date 06/21/20  Educator [facility]  Instruction Review Code 1- Verbalizes Understanding      Education: Flexibility, Balance, Mind/Body Relaxation: - Group verbal and visual presentation with interactive activity on the components of exercise prescription. Introduces F.I.T.T principle from ACSM for exercise prescriptions. Reviews F.I.T.T. principles of flexibility and balance exercise training including progression. Also discusses the mind body connection.  Reviews various relaxation techniques to help reduce and manage stress (i.e. Deep breathing, progressive muscle relaxation, and visualization). Balance handout provided to take home. Written material given at graduation.   Cardiac Rehab from 07/31/2020 in [facility] Cardiac and Pulmonary Rehab  Date 07/12/20  Educator [facility]  Instruction Review Code 1- Verbalizes Understanding      Activity Barriers & Risk Stratification:  Activity Barriers & Cardiac Risk Stratification - 05/15/20 1310      Activity Barriers & Cardiac Risk Stratification   Activity Barriers Deconditioning;Muscular Weakness;Balance Concerns    Cardiac Risk Stratification High           6 Minute  Walk:  6 Minute Walk    Row Name 05/15/20 1307         6 Minute Walk   Phase Initial     Distance 1235 feet     Walk Time 6 minutes     # of Rest Breaks 0     MPH 2.34     METS 3.1     RPE 8     VO2 Peak 10.84     Symptoms No     Resting HR 86 bpm     Resting BP 124/62     Resting Oxygen Saturation  98 %     Exercise Oxygen Saturation  during 6 min walk 99 %     Max Ex. HR 114 bpm     Max Ex. BP 130/64     2 Minute Post BP 134/70            Oxygen Initial Assessment:   Oxygen Re-Evaluation:   Oxygen Discharge (Final Oxygen Re-Evaluation):   Initial Exercise Prescription:  Initial Exercise Prescription - 05/15/20 1300      Date of Initial Exercise RX and Referring Provider   Date 05/15/20  Referring Provider Bartholome Bill MD      Treadmill   MPH 2.3    Grade 1    Minutes 15    METs 3.08      Elliptical   Level 1    Speed 2.7    Minutes 15    METs 3      REL-XR   Level 3    Speed 50    Minutes 15    METs 3      Prescription Details   Frequency (times per week) 2    Duration Progress to 30 minutes of continuous aerobic without signs/symptoms of physical distress      Intensity   THRR 40-80% of Max Heartrate 115-144    Ratings of Perceived Exertion 11-13    Perceived Dyspnea 0-4      Progression   Progression Continue to progress workloads to maintain intensity without signs/symptoms of physical distress.      Resistance Training   Training Prescription Yes    Weight 3 lb    Reps 10-15           Perform Capillary Blood Glucose checks as needed.  Exercise Prescription Changes:  Exercise Prescription Changes    Row Name 05/15/20 1300 05/23/20 0700 05/30/20 0700 06/11/20 1700 06/26/20 0800     Response to Exercise   Blood Pressure (Admit) 124/62 -- 128/70 132/72 134/72   Blood Pressure (Exercise) 130/64 -- 114/54 140/68 134/70   Blood Pressure (Exit) 134/70 -- 118/72 130/70 118/64   Heart Rate (Admit) 86 bpm -- 82 bpm 84 bpm 76  bpm   Heart Rate (Exercise) 114 bpm -- 107 bpm 116 bpm 92 bpm   Heart Rate (Exit) 87 bpm -- 91 bpm 82 bpm 79 bpm   Oxygen Saturation (Admit) 98 % -- -- -- --   Oxygen Saturation (Exercise) 99 % -- -- -- --   Rating of Perceived Exertion (Exercise) 8 -- 12 15 13    Symptoms none -- none none none   Comments walk test results -- -- -- --   Duration -- -- Continue with 30 min of aerobic exercise without signs/symptoms of physical distress. Continue with 30 min of aerobic exercise without signs/symptoms of physical distress. Continue with 30 min of aerobic exercise without signs/symptoms of physical distress.   Intensity -- -- THRR unchanged THRR unchanged THRR unchanged     Progression   Progression -- -- Continue to progress workloads to maintain intensity without signs/symptoms of physical distress. Continue to progress workloads to maintain intensity without signs/symptoms of physical distress. Continue to progress workloads to maintain intensity without signs/symptoms of physical distress.   Average METs -- -- 3.52 3.4 3.63     Resistance Training   Training Prescription -- -- Yes Yes Yes   Weight -- -- 3 lb 5 lb 6 lb   Reps -- -- 10-15 10-15 10-15     Interval Training   Interval Training -- -- No No No     Treadmill   MPH -- -- 2.5 2.5 2.7   Grade -- -- 1.5 1.5 2.5   Minutes -- -- 15 15 15    METs -- -- 3.43 3.43 4     Recumbant Elliptical   Level -- -- -- -- 2.3   Minutes -- -- -- -- 15   METs -- -- -- -- 2.7     Elliptical   Level -- -- 1 1 --   Speed -- -- 2.7 2.7 --  Minutes -- -- 5 4 --     REL-XR   Level -- -- 5 15 10    Minutes -- -- 15 15 15    METs -- -- 3.6 -- 4.2     Home Exercise Plan   Plans to continue exercise at -- Home (comment)  walking, bike, elliptical, weights Home (comment)  walking, bike, elliptical, weights Home (comment)  walking, bike, elliptical, weights Home (comment)  walking, bike, elliptical, weights   Frequency -- Add 3 additional days to  program exercise sessions. Add 3 additional days to program exercise sessions. Add 3 additional days to program exercise sessions. Add 3 additional days to program exercise sessions.   Initial Home Exercises Provided -- 05/23/20 05/23/20 -- 05/23/20   Row Name 07/09/20 1300 07/23/20 1400 08/06/20 1200         Response to Exercise   Blood Pressure (Admit) 138/72 124/62 130/68     Blood Pressure (Exercise) 150/68 128/74 144/68     Blood Pressure (Exit) 132/70 120/64 108/56     Heart Rate (Admit) 82 bpm 70 bpm 75 bpm     Heart Rate (Exercise) 114 bpm 103 bpm 112 bpm     Heart Rate (Exit) 85 bpm 76 bpm 74 bpm     Rating of Perceived Exertion (Exercise) 13 14 13      Symptoms none none none     Duration Continue with 30 min of aerobic exercise without signs/symptoms of physical distress. Continue with 30 min of aerobic exercise without signs/symptoms of physical distress. Continue with 30 min of aerobic exercise without signs/symptoms of physical distress.     Intensity THRR unchanged THRR unchanged THRR unchanged       Progression   Progression Continue to progress workloads to maintain intensity without signs/symptoms of physical distress. Continue to progress workloads to maintain intensity without signs/symptoms of physical distress. Continue to progress workloads to maintain intensity without signs/symptoms of physical distress.     Average METs 5.8 4.55 5.7       Resistance Training   Training Prescription Yes Yes Yes     Weight 6 lb 6 lb 6 lb     Reps 10-15 10-15 10-15       Interval Training   Interval Training No No No       Treadmill   MPH 2.8 2.7 2.7     Grade 2.5 2.5 3.5     Minutes 15 15 15      METs 4 4 4.37       REL-XR   Level 12 12 12      Minutes 15 15 15      METs 5.8 5.1 7.1       Home Exercise Plan   Plans to continue exercise at Home (comment)  walking, bike, elliptical, weights Home (comment)  walking, bike, elliptical, weights --     Frequency Add 3  additional days to program exercise sessions. Add 3 additional days to program exercise sessions. --     Initial Home Exercises Provided 05/23/20 05/23/20 --            Exercise Comments:  Exercise Comments    Row Name 05/17/20 [phone]           Exercise Comments First full day of exercise!  Patient was oriented to gym and equipment including functions, settings, policies, and procedures.  Patient's individual exercise prescription and treatment plan were reviewed.  All starting workloads were established based on the results of the 6 minute walk  test done at initial orientation visit.  The plan for exercise progression was also introduced and progression will be customized based on patient's performance and goals.              Exercise Goals and Review:  Exercise Goals    Row Name 05/15/20 1314             Exercise Goals   Increase Physical Activity Yes       Intervention Provide advice, education, support and counseling about physical activity/exercise needs.;Develop an individualized exercise prescription for aerobic and resistive training based on initial evaluation findings, risk stratification, comorbidities and participant's personal goals.       Expected Outcomes Short Term: Attend rehab on a regular basis to increase amount of physical activity.;Long Term: Add in home exercise to make exercise part of routine and to increase amount of physical activity.;Long Term: Exercising regularly at least 3-5 days a week.       Increase Strength and Stamina Yes       Intervention Develop an individualized exercise prescription for aerobic and resistive training based on initial evaluation findings, risk stratification, comorbidities and participant's personal goals.;Provide advice, education, support and counseling about physical activity/exercise needs.       Expected Outcomes Short Term: Increase workloads from initial exercise prescription for resistance, speed, and METs.;Short Term:  Perform resistance training exercises routinely during rehab and add in resistance training at home;Long Term: Improve cardiorespiratory fitness, muscular endurance and strength as measured by increased METs and functional capacity (6MWT)       Able to understand and use rate of perceived exertion (RPE) scale Yes       Intervention Provide education and explanation on how to use RPE scale       Expected Outcomes Short Term: Able to use RPE daily in rehab to express subjective intensity level;Long Term:  Able to use RPE to guide intensity level when exercising independently       Able to understand and use Dyspnea scale Yes       Intervention Provide education and explanation on how to use Dyspnea scale       Expected Outcomes Short Term: Able to use Dyspnea scale daily in rehab to express subjective sense of shortness of breath during exertion;Long Term: Able to use Dyspnea scale to guide intensity level when exercising independently       Knowledge and understanding of Target Heart Rate Range (THRR) Yes       Intervention Provide education and explanation of THRR including how the numbers were predicted and where they are located for reference       Expected Outcomes Short Term: Able to state/look up THRR;Short Term: Able to use daily as guideline for intensity in rehab;Long Term: Able to use THRR to govern intensity when exercising independently       Able to check pulse independently Yes       Intervention Provide education and demonstration on how to check pulse in carotid and radial arteries.;Review the importance of being able to check your own pulse for safety during independent exercise       Expected Outcomes Short Term: Able to explain why pulse checking is important during independent exercise;Long Term: Able to check pulse independently and accurately       Understanding of Exercise Prescription Yes       Intervention Provide education, explanation, and written materials on patient's  individual exercise prescription       Expected Outcomes Short  Term: Able to explain program exercise prescription;Long Term: Able to explain home exercise prescription to exercise independently              Exercise Goals Re-Evaluation :  Exercise Goals Re-Evaluation    Row Name 05/17/20 0824 05/23/20 0736 05/29/20 0740 06/11/20 1718 06/14/20 0735     Exercise Goal Re-Evaluation   Exercise Goals Review Able to understand and use rate of perceived exertion (RPE) scale;Knowledge and understanding of Target Heart Rate Range (THRR);Able to understand and use Dyspnea scale;Understanding of Exercise Prescription Increase Physical Activity;Increase Strength and Stamina;Able to understand and use rate of perceived exertion (RPE) scale;Able to understand and use Dyspnea scale;Knowledge and understanding of Target Heart Rate Range (THRR);Understanding of Exercise Prescription;Able to check pulse independently Increase Physical Activity;Increase Strength and Stamina;Able to understand and use rate of perceived exertion (RPE) scale;Able to understand and use Dyspnea scale;Knowledge and understanding of Target Heart Rate Range (THRR);Understanding of Exercise Prescription;Able to check pulse independently Increase Physical Activity;Increase Strength and Stamina;Able to understand and use rate of perceived exertion (RPE) scale Increase Physical Activity;Increase Strength and Stamina;Able to understand and use rate of perceived exertion (RPE) scale   Comments Reviewed RPE and dyspnea scales, THR and program prescription with pt today.  Pt voiced understanding and was given a copy of goals to take home. Reviewed home exercise with pt today.  Pt plans to use stationary bike and elliptical at home for exercise.  He will also continue to use his weights and walk when at beach too.  Reviewed THR, pulse, RPE, sign and symptoms, pulse oximetery and when to call 911 or MD.  Also discussed weather considerations and indoor  options.  Pt voiced understanding. Allen hasnt tried to exercise at home yet.  He has an ellipitcal and bike at home.  He hasnt been sore since starting to exercise. Aleen tried the elliptical and made it 4 minutes !  he has increase to 5 lb for strength work.  Staff will monitor progress. Cycling at home and ellipital - usually 40 minutes1-2x/week. Bought weights (10 pounds) - before his heart event and will work up to them.   Expected Outcomes Short: Use RPE daily to regulate intensity. Long: Follow program prescription in THR. Short: Continue to exercise on off days  Long: Continue to improve stamina. Short:  add in home exercise Long: improve stamina and MET level Short: build up to 15 min on elliptical Long: improve overall MET level ST: increase exercise at home 1 extra day. LT: improve MET level   Row Name 06/26/20 0833 07/09/20 1333 07/23/20 1455 07/24/20 0723 08/06/20 1244     Exercise Goal Re-Evaluation   Exercise Goals Review Increase Physical Activity;Increase Strength and Stamina;Understanding of Exercise Prescription Increase Physical Activity;Increase Strength and Stamina;Understanding of Exercise Prescription Increase Physical Activity;Increase Strength and Stamina;Understanding of Exercise Prescription Increase Physical Activity;Increase Strength and Stamina;Understanding of Exercise Prescription Increase Physical Activity;Increase Strength and Stamina   Comments Antony Haste has been doing well in rehab.  He is now up to level  2.3 on the recumbent elliptical!  We will continue to monitor his progress. Zenia Resides continues to progress well.  He is on level 12 on the XR and averaged 5.8 METs.  He continues to use 6 lb for strength work.  He has tried split squats during strength work for a challenge. Zenia Resides was at [employer] last week, walking everywhere.  He has been doing well in rehab.  He continues at level 12 on the XR.  We  will continue to monitor his progress. Zenia Resides is doing well with exercise at home.  He is riding his recumbant bike and doing the elliptical at home for 3 days/week on his off days from rehab. He totals about 20 minutes for one session. Discussed increasing his total exercise sessions to 30 minutes. He uses 10 lbs at home for handweights. Zenia Resides is progressing well and has increased incline on TM to 3.5%.  He has moved up to 6 lb on weights.   Expected Outcomes Short: Talk about adding in intervals  Long: Continue to improve stamina. Short: add intervals Long: improve MET level Short: Talk about intervals again upon return.  Long: Continue to improve stamina. Short: Increase exercise duration sessions up to 30 minutes at home Long: Exercise independently at home with no complications or symptoms Short:  continue to exercise consistently at home and HT Long:  continue to improve stamina          Discharge Exercise Prescription (Final Exercise Prescription Changes):  Exercise Prescription Changes - 08/06/20 1200      Response to Exercise   Blood Pressure (Admit) 130/68    Blood Pressure (Exercise) 144/68    Blood Pressure (Exit) 108/56    Heart Rate (Admit) 75 bpm    Heart Rate (Exercise) 112 bpm    Heart Rate (Exit) 74 bpm    Rating of Perceived Exertion (Exercise) 13    Symptoms none    Duration Continue with 30 min of aerobic exercise without signs/symptoms of physical distress.    Intensity THRR unchanged      Progression   Progression Continue to progress workloads to maintain intensity without signs/symptoms of physical distress.    Average METs 5.7      Resistance Training   Training Prescription Yes    Weight 6 lb    Reps 10-15      Interval Training   Interval Training No      Treadmill   MPH 2.7    Grade 3.5    Minutes 15    METs 4.37      REL-XR   Level 12    Minutes 15    METs 7.1           Nutrition:  Target Goals: Understanding of nutrition guidelines, daily intake of sodium <1532m, cholesterol <2068m calories 30% from fat and 7% or  less from saturated fats, daily to have 5 or more servings of fruits and vegetables.  Education: All About Nutrition: -Group instruction provided by verbal, written material, interactive activities, discussions, models, and posters to present general guidelines for heart healthy nutrition including fat, fiber, MyPlate, the role of sodium in heart healthy nutrition, utilization of the nutrition label, and utilization of this knowledge for meal planning. Follow up email sent as well. Written material given at graduation.   [facility]  [facility] 05[facility]17[facility]20  [facility] 1- Verbalizes Understanding  [need identified]      Biometrics:  Pre Biometrics - 05/15/20 1314      Pre Biometrics   Height 5' 9.5" (1.765 m)    Weight 221 lb 4.8 oz (100.4 kg)    BMI (Calculated) 32.22    Single Leg Stand 4.72 seconds            Nutrition Therapy Plan and Nutrition Goals:  Nutrition Therapy & Goals - 05/29/20 0817      Nutrition Therapy   Diet Heart healthy, Low  Na, T2DM    Drug/Food Interactions Statins/Certain Fruits    Protein (specify units) 80g    Fiber 30 grams    Whole Grain Foods 3 servings    Saturated Fats 12 max. grams    Fruits and Vegetables 5 servings/day    Sodium 1.5 grams      Personal Nutrition Goals   Nutrition Goal ST: increase wate intake by 16oz per day  LT: Feel better 7/10 now - working on increasing energy    Comments Pt reports losing appetite and eating about half of his usual intake. B:OJ or low sugar grape juice and two pieces of toast and 2-3 slices of bacon (will not eat eggs). L: vary from meat with two vegetables to a sandwich. D: meat and two vegetables or sandwich or hamburger. S: small amount of ice cream. cooks vegetables steamed, baked, and grilled. Likes to eat salad with dark leafy greens as his salad base. Drinks elderberry juice. Eats a variety vegetables. Eats peanut butter and tries  to eat more fruit. Tries to eat whole wheat bread, will sometimes have oatmeal. Pt reports high fat foods turn him off to eating and has now switched to 2% milk. sugar free mountain dew and unsweetened or half and half tea. Pt reports that blood sugar has been good, only had lows right after surgery - now at low 100s. No longer on glipizide anymore, now just on trulicity and metformin. Pt report having rouble with drinking water, but wants to drink more.      Intervention Plan   Intervention Prescribe, educate and counsel regarding individualized specific dietary modifications aiming towards targeted core components such as weight, hypertension, lipid management, diabetes, heart failure and other comorbidities.;Nutrition handout(s) given to patient.    Expected Outcomes Short Term Goal: Understand basic principles of dietary content, such as calories, fat, sodium, cholesterol and nutrients.;Short Term Goal: A plan has been developed with personal nutrition goals set during dietitian appointment.;Long Term Goal: Adherence to prescribed nutrition plan.           Nutrition Assessments:  Nutrition Assessments - 05/15/20 1326      MEDFICTS Scores   Pre Score 74          MEDIFICTS Score Key:  ?70 Need to make dietary changes   40-70 Heart Healthy Diet  ? 40 Therapeutic Level Cholesterol Diet   Picture Your Plate Scores:  <16 Unhealthy dietary pattern with much room for improvement.  41-50 Dietary pattern unlikely to meet recommendations for good health and room for improvement.  51-60 More healthful dietary pattern, with some room for improvement.   >60 Healthy dietary pattern, although there may be some specific behaviors that could be improved.    Nutrition Goals Re-Evaluation:  Nutrition Goals Re-Evaluation    Row Name 06/14/20 0726 07/24/20 0833           Goals   Nutrition Goal ST: increase water intake by 16oz per day  LT: Feel better 7/10 now - working on increasing  energy ST: increase water intake by 16oz per day  LT: Feel better 7/10 now - working on increasing energy      Comment Pt reports ST goal going "so so". He reports some days that he will drink more than others. Will still want to work towards this goal. Start bringing your water with you and to your exercise. No changes in diet aside from increased fruit and vegetable intake. Patient will continue to work on goals provided by RD.  Patient does not report any new questions or concerns at this time.      Expected Outcome ST: increase water intake by 16oz per day  LT: Feel better 7/10 now - working on increasing energy ST: increase water intake by 16oz per day  LT: Feel better 7/10 now - working on increasing energy             Nutrition Goals Discharge (Final Nutrition Goals Re-Evaluation):  Nutrition Goals Re-Evaluation - 07/24/20 0833      Goals   Nutrition Goal ST: increase water intake by 16oz per day  LT: Feel better 7/10 now - working on increasing energy    Comment Patient will continue to work on goals provided by RD. Patient does not report any new questions or concerns at this time.    Expected Outcome ST: increase water intake by 16oz per day  LT: Feel better 7/10 now - working on increasing energy           Psychosocial: Target Goals: Acknowledge presence or absence of significant depression and/or stress, maximize coping skills, provide positive support system. Participant is able to verbalize types and ability to use techniques and skills needed for reducing stress and depression.   Education: Stress, Anxiety, and Depression - Group verbal and visual presentation to define topics covered.  Reviews how body is impacted by stress, anxiety, and depression.  Also discusses healthy ways to reduce stress and to treat/manage anxiety and depression.  Written material given at graduation.   Cardiac Rehab from 07/31/2020 in [facility] Cardiac and Pulmonary Rehab  Date 06/14/20  Educator [facility]   Instruction Review Code 1- Verbalizes Understanding      Education: Sleep Hygiene -Provides group verbal and written instruction about how sleep can affect your health.  Define sleep hygiene, discuss sleep cycles and impact of sleep habits. Review good sleep hygiene tips.    Initial Review & Psychosocial Screening:  Initial Psych Review & Screening - 05/10/20 0939      Initial Review   Current issues with Current Stress Concerns    Source of Stress Concerns Family    Comments mother has dementia (living at [place])      [place]? Yes   wife, cousins that live near by (he's an only child)     Barriers   Psychosocial barriers to participate in program There are no identifiable barriers or psychosocial needs.      Screening Interventions   Interventions Encouraged to exercise;To provide support and resources with identified psychosocial needs;Provide feedback about the scores to participant    Expected Outcomes Short Term goal: Utilizing psychosocial counselor, staff and physician to assist with identification of specific Stressors or current issues interfering with healing process. Setting desired goal for each stressor or current issue identified.;Long Term Goal: Stressors or current issues are controlled or eliminated.;Short Term goal: Identification and review with participant of any Quality of Life or Depression concerns found by scoring the questionnaire.;Long Term goal: The participant improves quality of Life and PHQ9 Scores as seen by post scores and/or verbalization of changes           Quality of Life Scores:   Quality of Life - 05/15/20 1326      Quality of Life   Select Quality of Life      Quality of Life Scores   Health/Function Pre 29.57 %    Socioeconomic Pre 30 %    Psych/Spiritual Pre 30 %  Family Pre 30 %    GLOBAL Pre 29.82 %          Scores of 19 and below usually indicate a poorer quality of life in these areas.  A  difference of  2-3 points is a clinically meaningful difference.  A difference of 2-3 points in the total score of the Quality of Life Index has been associated with significant improvement in overall quality of life, self-image, physical symptoms, and general health in studies assessing change in quality of life.  PHQ-9: Recent Review Flowsheet Data    Depression screen [facility] 2/9 08/07/2020 05/15/2020 07/18/2019 04/01/2017 04/01/2017   Decreased Interest 0 0 0 0 0   Down, Depressed, Hopeless 0 0 0 0 0   PHQ - 2 Score 0 0 0 0 0   Altered sleeping 0 0 - 2 -   Tired, decreased energy 0 0 - 1 -   Change in appetite 0 0 - 0 -   Feeling bad or failure about yourself  0 0 - 0 -   Trouble concentrating 0 0 - 0 -   Moving slowly or fidgety/restless 0 0 - 0 -   Suicidal thoughts 0 0 - 0 -   PHQ-9 Score 0 0 - 3 -   Difficult doing work/chores Not difficult at all Not difficult at all - - -     Interpretation of Total Score  Total Score Depression Severity:  1-4 = Minimal depression, 5-9 = Mild depression, 10-14 = Moderate depression, 15-19 = Moderately severe depression, 20-27 = Severe depression   Psychosocial Evaluation and Intervention:  Psychosocial Evaluation - 05/10/20 0946      Psychosocial Evaluation & Interventions   Interventions Encouraged to exercise with the program and follow exercise prescription    Comments 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  He is a good spirited man. He is an only child but has a great support system with his wife and cousins that live nearby.  His mother is now living in [facility] with dementia, which got worse while he was having to undergo surgery.  Overall, he is doing very well and eager to get going again.  He wants to get back to his normal and be able to walk on the beach again without having to stop to catch his breath.  He sleeps well at night and has no other major stressors currently.    Expected Outcomes Short: Attend rehab to build  stamina  Long: Continue to cope well with recovery    Continue Psychosocial Services  Follow up required by staff           Psychosocial Re-Evaluation:  Psychosocial Re-Evaluation    [place] Name 05/29/20 0742 06/14/20 0728 07/24/20 0735         Psychosocial Re-Evaluation   Current issues with Current Stress Concerns Current Stress Concerns Current Stress Concerns     Comments Zenia Resides has no new stress concerns - says he has less stress since he got his taxes done. Zenia Resides has no new stress concerns - says he has less stress since he got his taxes mostly done and he is finishing them up now. He has planned travel for next year - [place], [place], [place], [place]. He is still having stress with his mom, but she is doing better with her care. Two daughters he has that he has no relationship with because of their mother. Wife is his support system as well as his cousins. He  reports fishing  and cycling to help manage stress. 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 Zenia Resides just got back from [place] and had a really good time with his wife. He has a plan to go to [place] at the end of January. He continues to stress about his mom who currently lives in a home. He denies any other big stressors at this time. He has good support from his family. He is keeping busy right now decorating for the holidays.     Expected Outcomes Short: continue to exercise to manage stress Long: maintain positive outlook Short: continue to exercise to manage stress Long: maintain positive outlook Short: Continue attendance with [facility] for stress management Long: Maintain overall posititive attitude     Interventions -- -- Encouraged to attend Cardiac Rehabilitation for the exercise     Continue Psychosocial Services  -- -- Follow up required by staff     Comments -- mother has dementia (living at [facility]) --       Initial Review   Source of Stress Concerns -- None Identified;Family --             Psychosocial Discharge (Final Psychosocial Re-Evaluation):  Psychosocial Re-Evaluation - 07/24/20 0735      Psychosocial Re-Evaluation   Current issues with Current Stress Concerns    Comments Zenia Resides just got back from [place] and had a really good time with his wife. He has a plan to go to [place] at the end of January. He continues to stress about his mom who currently lives in a home. He denies any other big stressors at this time. He has good support from his family. He is keeping busy right now decorating for the holidays.    Expected Outcomes Short: Continue attendance with [facility] for stress management Long: Maintain overall posititive attitude    Interventions Encouraged to attend Cardiac Rehabilitation for the exercise    Continue Psychosocial Services  Follow up required by staff           Vocational Rehabilitation: Provide vocational rehab assistance to qualifying candidates.   Vocational Rehab Evaluation & Intervention:  Vocational Rehab - 05/10/20 6834      Initial Vocational Rehab Evaluation & Intervention   Assessment shows need for Vocational Rehabilitation No   retired          Education: Education Goals: Education classes will be provided on a variety of topics geared toward better understanding of heart health and risk factor modification. Participant will state understanding/return demonstration of topics presented as noted by education test scores.  Learning Barriers/Preferences:  Learning Barriers/Preferences - 05/10/20 1962      Learning Barriers/Preferences   Learning Barriers Sight   glasses for reading   Learning Preferences None           General Cardiac Education Topics:  AED/CPR: - Group verbal and written instruction with the use of models to demonstrate the basic use of the AED with the basic ABC's of resuscitation.   Anatomy and Cardiac Procedures: - Group verbal and visual presentation and models provide information  about basic cardiac anatomy and function. Reviews the testing methods done to diagnose heart disease and the outcomes of the test results. Describes the treatment choices: Medical Management, Angioplasty, or Coronary Bypass Surgery for treating various heart conditions including Myocardial Infarction, Angina, Valve Disease, and Cardiac Arrhythmias.  Written material given at graduation.   Cardiac Rehab from 05/23/2020 in [facility] Cardiac and Pulmonary Rehab  Date  05/15/20  Instruction Review Code 3- Needs Reinforcement  [need identified]      Medication Safety: - Group verbal and visual instruction to review commonly prescribed medications for heart and lung disease. Reviews the medication, class of the drug, and side effects. Includes the steps to properly store meds and maintain the prescription regimen.  Written material given at graduation.   Cardiac Rehab from 05/23/2020 in [facility] Cardiac and Pulmonary Rehab  Date 05/23/20  Educator SB  Instruction Review Code 1- Verbalizes Understanding      Intimacy: - Group verbal instruction through game format to discuss how heart and lung disease can affect sexual intimacy. Written material given at graduation..   Know Your Numbers and Heart Failure: - Group verbal and visual instruction to discuss disease risk factors for cardiac and pulmonary disease and treatment options.  Reviews associated critical values for Overweight/Obesity, Hypertension, Cholesterol, and Diabetes.  Discusses basics of heart failure: signs/symptoms and treatments.  Introduces Heart Failure Zone chart for action plan for heart failure.  Written material given at graduation.   Cardiac Rehab from 07/31/2020 in [facility] Cardiac and Pulmonary Rehab  Date 07/31/20  Educator SB  Instruction Review Code 1- Verbalizes Understanding      Infection Prevention: - Provides verbal and written material to individual with discussion of infection control including proper hand washing and proper  equipment cleaning during exercise session.   Cardiac Rehab from 05/23/2020 in [facility] Cardiac and Pulmonary Rehab  Date 05/15/20  Educator [facility]  Instruction Review Code 1- Verbalizes Understanding      Falls Prevention: - Provides verbal and written material to individual with discussion of falls prevention and safety.   Cardiac Rehab from 05/23/2020 in [facility] Cardiac and Pulmonary Rehab  Date 05/15/20  Educator [facility]  Instruction Review Code 1- Verbalizes Understanding      Other: -Provides group and verbal instruction on various topics (see comments)   Knowledge Questionnaire Score:  Knowledge Questionnaire Score - 05/15/20 1326      Knowledge Questionnaire Score   Pre Score 24/28 Education Focus: Angina, Nutrtition, Exercise           Core Components/Risk Factors/Patient Goals at Admission:  Personal Goals and Risk Factors at Admission - 05/15/20 1329      Core Components/Risk Factors/Patient Goals on Admission    Weight Management Yes;Obesity;Weight Loss    Intervention Weight Management: Develop a combined nutrition and exercise program designed to reach desired caloric intake, while maintaining appropriate intake of nutrient and fiber, sodium and fats, and appropriate energy expenditure required for the weight goal.;Weight Management: Provide education and appropriate resources to help participant work on and attain dietary goals.;Weight Management/Obesity: Establish reasonable short term and long term weight goals.;Obesity: Provide education and appropriate resources to help participant work on and attain dietary goals.    Admit Weight 221 lb 4.8 oz (100.4 kg)    Goal Weight: Short Term 215 lb (97.5 kg)    Goal Weight: Long Term 200 lb (90.7 kg)    Expected Outcomes Short Term: Continue to assess and modify interventions until short term weight is achieved;Long Term: Adherence to nutrition and physical activity/exercise program aimed toward attainment of established weight  goal;Weight Loss: Understanding of general recommendations for a balanced deficit meal plan, which promotes 1-2 lb weight loss per week and includes a negative energy balance of [phone] kcal/d;Understanding recommendations for meals to include 15-35% energy as protein, 25-35% energy from fat, 35-60% energy from carbohydrates, less than 240m of dietary cholesterol, 20-35 gm  of total fiber daily;Understanding of distribution of calorie intake throughout the day with the consumption of 4-5 meals/snacks    Diabetes Yes    Intervention Provide education about signs/symptoms and action to take for hypo/hyperglycemia.;Provide education about proper nutrition, including hydration, and aerobic/resistive exercise prescription along with prescribed medications to achieve blood glucose in normal ranges: Fasting glucose 65-99 mg/dL    Expected Outcomes Short Term: Participant verbalizes understanding of the signs/symptoms and immediate care of hyper/hypoglycemia, proper foot care and importance of medication, aerobic/resistive exercise and nutrition plan for blood glucose control.;Long Term: Attainment of HbA1C < 7%.    Hypertension Yes    Intervention Provide education on lifestyle modifcations including regular physical activity/exercise, weight management, moderate sodium restriction and increased consumption of fresh fruit, vegetables, and low fat dairy, alcohol moderation, and smoking cessation.;Monitor prescription use compliance.    Expected Outcomes Short Term: Continued assessment and intervention until BP is < 140/39m HG in hypertensive participants. < 130/844mHG in hypertensive participants with diabetes, heart failure or chronic kidney disease.;Long Term: Maintenance of blood pressure at goal levels.    Lipids Yes    Intervention Provide education and support for participant on nutrition & aerobic/resistive exercise along with prescribed medications to achieve LDL <7061mHDL >23m35m  Expected Outcomes  Short Term: Participant states understanding of desired cholesterol values and is compliant with medications prescribed. Participant is following exercise prescription and nutrition guidelines.;Long Term: Cholesterol controlled with medications as prescribed, with individualized exercise RX and with personalized nutrition plan. Value goals: LDL < 70mg26mL > 40 mg.           Education:Diabetes - Individual verbal and written instruction to review signs/symptoms of diabetes, desired ranges of glucose level fasting, after meals and with exercise. Acknowledge that pre and post exercise glucose checks will be done for 3 sessions at entry of program.   [facility]  Date 05/15/20  Educator [facility] Review Code 1- Verbalizes Understanding      Core Components/Risk Factors/Patient Goals Review:   Goals and Risk Factor Review    Row Name 05/29/20 0739 05/31/20 0728 06/14/20 0733 07/24/20 0728       Core Components/Risk Factors/Patient Goals Review   Personal Goals Review Weight Management/Obesity;Hypertension;Diabetes;Lipids Weight Management/Obesity;Hypertension;Diabetes;Lipids Weight Management/Obesity;Hypertension;Diabetes;Lipids Weight Management/Obesity;Hypertension;Diabetes    Review Allen checks his BG at home on days not at HT.  [facility] around 110.  He reports taking meds as directed. -- Allen checks his BG at home on days not at [facility]. [facility] 101 in [facility]. He reports taking meds as directed. Weight is staying stable, AllenZenia Residesrying to maintain his weight and is happy where he is. He lost weight after his surgery and has stayed about the same since. He is checking his sugars everyday and have been staying around 110-120. He does recognize symptoms if he is too low. He does not check his BP at home as he does not have a cuff. BPs have been ranging [phone]  [facility].    Expected Outcomes Short: attend HT consistently Long: manage risk factors on his own -- Short: attend HT consistently Long: manage risk factors on his own Short: Continue checking blood sugars and weights regularly Long: Manage lifestyle risk factors           Core Components/Risk Factors/Patient Goals at Discharge (Final Review):  Goals and Risk Factor Review - 07/24/20 0728      Core Components/Risk Factors/Patient Goals Review   Personal Goals Review Weight Management/Obesity;Hypertension;Diabetes    Review Weight is staying stable, Zenia Resides is trying to maintain his weight and is happy where he is. He lost weight after his surgery and has stayed about the same since. He is checking his sugars everyday and have been staying around 110-120. He does recognize symptoms if he is too low. He does not check his BP at home as he does not have a cuff. BPs have been ranging 355-732K systolic here at [facility]. He is stayin compliant with all his medications.    Expected Outcomes Short: Continue checking blood sugars and weights regularly Long: Manage lifestyle risk factors           ITP Comments:  ITP Comments    Row Name 05/10/20 0949 05/15/20 1304 05/16/20 1624 05/17/20 0824 06/13/20 0613   ITP Comments Completed virtual orientation today.  EP evaluation is scheduled for Tues 9/7 at 9am.  Documentation for diagnosis can be found in [facility] encounter 04/04/20. Completed 6MWT and gym orientation. Initial ITP created and sent for review to Dr. Emily Filbert, Medical Director. 30 day review completed. ITP sent to Dr. Emily Filbert, Medical Director of Cardiac and Pulmonary Rehab. Continue with ITP unless changes are made by physician. First full day of exercise!  Patient was oriented to gym and equipment including functions, settings, policies, and procedures.  Patient's individual exercise prescription and treatment plan were reviewed.  All starting workloads were established based  on the results of the 6 minute walk test done at initial orientation visit.  The plan for exercise progression was also introduced and progression will be customized based on patient's performance and goals. 30 Day review completed. Medical Director ITP review done, changes made as directed, and signed approval by Medical Director.   [place] Name 07/11/20 0713 08/08/20 1040         ITP Comments 30 Day review completed. Medical Director ITP review done, changes made as directed, and signed approval by Medical Director. 30 Day review completed. Medical Director ITP review done, changes made as directed, and signed approval by Medical Director.             Comments:

## 2020-08-09 ENCOUNTER — Encounter: Payer: 59 | Attending: Cardiology

## 2020-08-09 ENCOUNTER — Other Ambulatory Visit: Payer: Self-pay

## 2020-08-09 DIAGNOSIS — Z951 Presence of aortocoronary bypass graft: Secondary | ICD-10-CM | POA: Diagnosis present

## 2020-08-09 NOTE — Progress Notes (Signed)
Daily Session Note  Patient Details  Name: Reginald Murphy MRN: [id number] Date of Birth: 02-17-1956 Referring Provider:     Cardiac Rehab from 05/15/2020 in [facility] Cardiac and Pulmonary Rehab  Referring Provider Bartholome Bill MD      Encounter Date: 08/09/2020  Check In:  Session Check In - 08/09/20 8127      Check-In   Supervising physician immediately available to respond to emergencies See telemetry face sheet for immediately available ER MD    Location [facility]-Cardiac & Pulmonary Rehab    Staff Present Birdie Sons, MPA, RN;Amanda Oletta Darter, BA, ACSM CEP, Exercise Physiologist;Melissa Caiola RDN, LDN    Virtual Visit No    Medication changes reported     No    Fall or balance concerns reported    No    Warm-up and Cool-down Performed on first and last piece of equipment    Resistance Training Performed Yes    VAD Patient? No    PAD/SET Patient? No      Pain Assessment   Currently in Pain? No/denies              Social History   Tobacco Use  Smoking Status Never Smoker  Smokeless Tobacco Never Used    Goals Met:  Independence with exercise equipment Exercise tolerated well No report of cardiac concerns or symptoms Strength training completed today  Goals Unmet:  Not Applicable  Comments: Pt able to follow exercise prescription today without complaint.  Will continue to monitor for progression.    Dr. Emily Filbert is Medical Director for [place] and [facility].

## 2020-08-14 ENCOUNTER — Other Ambulatory Visit: Payer: Self-pay

## 2020-08-14 ENCOUNTER — Encounter: Payer: 59 | Admitting: *Deleted

## 2020-08-14 ENCOUNTER — Telehealth: Payer: Self-pay

## 2020-08-14 DIAGNOSIS — Z951 Presence of aortocoronary bypass graft: Secondary | ICD-10-CM | POA: Diagnosis not present

## 2020-08-14 NOTE — Telephone Encounter (Signed)
Pt advised.   Thanks,   -Sherrilyn Nairn  

## 2020-08-14 NOTE — Telephone Encounter (Signed)
-----   Message from Jerrol Banana., MD sent at 08/10/2020  8:02 AM EST ----- Labs stable.  Continue to work on diet and exercise.

## 2020-08-14 NOTE — Progress Notes (Signed)
Daily Session Note  Patient Details  Name: Reginald Murphy MRN: [id number] Date of Birth: Apr 13, 1956 Referring Provider:     Cardiac Rehab from 05/15/2020 in [facility] Cardiac and Pulmonary Rehab  Referring Provider Bartholome Bill MD      Encounter Date: 08/14/2020  Check In:  Session Check In - 08/14/20 0818      Check-In   Supervising physician immediately available to respond to emergencies See telemetry face sheet for immediately available ER MD    Location [facility]-Cardiac & Pulmonary Rehab    Staff Present Heath Lark, RN, BSN, Jacklynn Bue, MS Exercise Physiologist;Amanda Oletta Darter, [place], ACSM CEP, Exercise Physiologist    Virtual Visit No    Medication changes reported     No    Fall or balance concerns reported    No    Warm-up and Cool-down Performed on first and last piece of equipment    Resistance Training Performed Yes    VAD Patient? No    PAD/SET Patient? No      Pain Assessment   Currently in Pain? No/denies              Social History   Tobacco Use  Smoking Status Never Smoker  Smokeless Tobacco Never Used    Goals Met:  Independence with exercise equipment Exercise tolerated well No report of cardiac concerns or symptoms  Goals Unmet:  Not Applicable  Comments: Pt able to follow exercise prescription today without complaint.  Will continue to monitor for progression.    Dr. Emily Filbert is Medical Director for [place] and [facility].

## 2020-08-16 ENCOUNTER — Other Ambulatory Visit: Payer: Self-pay

## 2020-08-16 DIAGNOSIS — Z951 Presence of aortocoronary bypass graft: Secondary | ICD-10-CM | POA: Diagnosis not present

## 2020-08-16 NOTE — Progress Notes (Signed)
Daily Session Note  Patient Details  Name: Reginald Murphy MRN: [id number] Date of Birth: 1955/12/21 Referring Provider:   Flowsheet Row Cardiac Rehab from 05/15/2020 in [facility]  Referring Provider Bartholome Bill MD      Encounter Date: 08/16/2020  Check In:  Session Check In - 08/16/20 0744      Check-In   Supervising physician immediately available to respond to emergencies See telemetry face sheet for immediately available ER MD    Location [facility]-Cardiac & Pul[facility] Rehab    Staff Present Birdie Sons, MPA, RN;Melissa Caiola RDN, Rowe Pavy, BA, ACSM CEP, Exercise Physiologist    Virtual Visit No    Medication changes reported     No    Fall or balance concerns reported    No    Warm-up and Cool-down Performed on first and last piece of equipment    Resistance Training Performed Yes    VAD Patient? No    PAD/SET Patient? No      Pain Assessment   Currently in Pain? No/denies              Social History   Tobacco Use  Smoking Status Never Smoker  Smokeless Tobacco Never Used    Goals Met:  Independence with exercise equipment Exercise tolerated well No report of cardiac concerns or symptoms Strength training completed today  Goals Unmet:  Not Applicable  Comments: Pt able to follow exercise prescription today without complaint.  Will continue to monitor for progression.    Dr. Emily Filbert is Medical Director for [place] and [facility].

## 2020-08-28 ENCOUNTER — Other Ambulatory Visit: Payer: Self-pay

## 2020-08-28 ENCOUNTER — Encounter: Payer: 59 | Admitting: *Deleted

## 2020-08-28 DIAGNOSIS — Z951 Presence of aortocoronary bypass graft: Secondary | ICD-10-CM

## 2020-08-28 NOTE — Progress Notes (Signed)
Daily Session Note  Patient Details  Name: Reginald Murphy MRN: [id number] Date of Birth: [birth year] Referring Provider:   Flowsheet Row Cardiac Rehab from 05/15/2020 in [facility] Cardiac and Pulmonary Rehab  Referring Provider Bartholome Bill MD      Encounter Date: 08/28/2020  Check In:  Session Check In - 08/28/20 0759      Check-In   Supervising physician immediately available to respond to emergencies See telemetry face sheet for immediately available ER MD    Location [facility]-Cardiac & Pulmonary Rehab    Staff Present Heath Lark, RN, BSN, Jacklynn Bue, MS Exercise Physiologist;Amanda Oletta Darter, [place], ACSM CEP, Exercise Physiologist    Virtual Visit No    Medication changes reported     No    Fall or balance concerns reported    No    Warm-up and Cool-down Performed on first and last piece of equipment    Resistance Training Performed Yes    VAD Patient? No    PAD/SET Patient? No      Pain Assessment   Currently in Pain? No/denies              Social History   Tobacco Use  Smoking Status Never Smoker  Smokeless Tobacco Never Used    Goals Met:  Independence with exercise equipment Exercise tolerated well No report of cardiac concerns or symptoms  Goals Unmet:  Not Applicable  Comments: Pt able to follow exercise prescription today without complaint.  Will continue to monitor for progression.    Dr. Emily Filbert is Medical Director for [place] and [facility].

## 2020-09-04 ENCOUNTER — Encounter: Payer: 59 | Admitting: *Deleted

## 2020-09-04 ENCOUNTER — Other Ambulatory Visit: Payer: Self-pay

## 2020-09-04 ENCOUNTER — Telehealth (INDEPENDENT_AMBULATORY_CARE_PROVIDER_SITE_OTHER): Payer: Self-pay | Admitting: Gastroenterology

## 2020-09-04 DIAGNOSIS — Z951 Presence of aortocoronary bypass graft: Secondary | ICD-10-CM

## 2020-09-04 DIAGNOSIS — Z1211 Encounter for screening for malignant neoplasm of colon: Secondary | ICD-10-CM

## 2020-09-04 MED ORDER — NA SULFATE-K SULFATE-MG SULF 17.5-3.13-1.6 GM/177ML PO SOLN: 1.0000 | Freq: Once | ORAL | 0 refills | Status: AC

## 2020-09-04 NOTE — Progress Notes (Signed)
Gastroenterology Pre-Procedure Review  Request Date: Thursday 09/20/20 Requesting Physician: Dr. Vicente Males  PATIENT REVIEW QUESTIONS: The patient responded to the following health history questions as indicated:    1. Are you having any GI issues? no 2. Do you have a personal history of Polyps? no 3. Do you have a family history of Colon Cancer or Polyps? no 4. Diabetes Mellitus? yes (type 2) 5. Joint replacements in the past 12 months?no 6. 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 Ubaldo Glassing 7. Any artificial heart valves, MVP, or defibrillator?no    MEDICATIONS & ALLERGIES:    Patient reports the following regarding taking any anticoagulation/antiplatelet therapy:   Plavix, Coumadin, Eliquis, Xarelto, Lovenox, Pradaxa, Brilinta, or Effient? no Aspirin? yes (81 mg daily)  Patient confirms/reports the following medications:  Current Outpatient Medications  Medication Sig Dispense Refill  . ACCU-CHEK GUIDE test strip Please specify directions, refills and quantity 100 each 11  . albuterol (VENTOLIN HFA) 108 (90 Base) MCG/ACT inhaler TAKE 2 PUFFS BY MOUTH EVERY 6 HOURS AS NEEDED FOR WHEEZE OR SHORTNESS OF BREATH (Patient not taking: Reported on 05/02/2020) 18 g 2  . amLODipine (NORVASC) 5 MG tablet TAKE 1 TABLET BY MOUTH EVERY DAY 90 tablet 0  . aspirin EC 81 MG tablet Take 81 mg by mouth daily.     Marland Kitchen atorvastatin (LIPITOR) 40 MG tablet Take 40 mg by mouth at bedtime.    . Blood Glucose Monitoring Suppl (ACCU-CHEK NANO SMARTVIEW) w/Device KIT 1 strip by Does not apply route daily. 1 kit 0  . glipiZIDE (GLUCOTROL XL) 10 MG 24 hr tablet Take by mouth. (Patient not taking: Reported on 05/10/2020)    . glucose blood test strip 1 each by Other route as needed for other. Use as instructed    . lisinopril (ZESTRIL) 40 MG tablet TAKE 1 TABLET (40 MG TOTAL) BY MOUTH DAILY. 90 tablet 3  . metFORMIN (GLUCOPHAGE) 1000 MG tablet TAKE 1 TABLET BY MOUTH 2  TIMES DAILY WITH A MEAL. 180 tablet 0  . metoprolol tartrate (LOPRESSOR) 25 MG tablet Take by mouth.    . nitroGLYCERIN (NITROSTAT) 0.4 MG SL tablet SMARTSIG:1 Tablet(s) Sublingual PRN    . ONETOUCH DELICA LANCETS 90W MISC 2 (two) times daily. for testing  12  . SYMBICORT 80-4.5 MCG/ACT inhaler TAKE 2 PUFFS BY MOUTH TWICE A DAY (Patient not taking: Reported on 05/02/2020) 30.6 Inhaler 1  . tadalafil (CIALIS) 20 MG tablet Take 1 tablet (20 mg total) by mouth every three (3) days as needed for erectile dysfunction. 30 tablet 5  . TRULICITY 4.09 BD/5.3GD SOPN SMARTSIG:0.5 Milliliter(s) SUB-Q Once a Week 12 mL 3   No current facility-administered medications for this visit.    Patient confirms/reports the following allergies:  No Known Allergies  No orders of the defined types were placed in this encounter.   AUTHORIZATION INFORMATION Primary Insurance: 1D#: Group #:  Secondary Insurance: 1D#: Group #:  SCHEDULE INFORMATION: Date: Thursday 09/20/20 Time: Location:[facility]

## 2020-09-04 NOTE — Progress Notes (Signed)
Daily Session Note  Patient Details  Name: Reginald Murphy MRN: [id number] Date of Birth: 1956/04/29 Referring Provider:   Flowsheet Row Cardiac Rehab from 05/15/2020 in [facility] Cardiac [facility] Pul[facility] Rehab  Referring Provider Bartholome Bill MD      Encounter Date: 09/04/2020  Check In:  Session Check In - 09/04/20 0916      Check-In   Supervising physician immediately available to respond to emergencies See telemetry face sheet for immediately available ER MD    Location [facility]-Cardiac & Pulmonary Rehab    Staff Present Heath Lark, RN, BSN, CCRP;Amanda Sommer, BA, ACSM CEP, Exercise Physiologist;Melissa Caiola RDN, LDN    Virtual Visit No    Medication changes reported     No    Fall or balance concerns reported    No    Warm-up and Cool-down Performed on first and last piece of equipment    Resistance Training Performed Yes    VAD Patient? No    PAD/SET Patient? No      Pain Assessment   Currently in Pain? No/denies              Social History   Tobacco Use  Smoking Status Never Smoker  Smokeless Tobacco Never Used    Goals Met:  Independence with exercise equipment Exercise tolerated well No report of cardiac concerns or symptoms  Goals Unmet:  Not Applicable  Comments: Pt able to follow exercise prescription today without complaint.  Will continue to monitor for progression.    Dr. Emily Filbert is Medical Director for [place] and [facility].

## 2020-09-05 ENCOUNTER — Encounter: Payer: Self-pay | Admitting: *Deleted

## 2020-09-05 DIAGNOSIS — Z951 Presence of aortocoronary bypass graft: Secondary | ICD-10-CM

## 2020-09-05 NOTE — Progress Notes (Signed)
Cardiac Individual Treatment Plan  Patient Details  Name: COSTANTINO KOHLBECK MRN: [id number] Date of Birth: [birth year] Referring Provider:   Flowsheet Row Cardiac Rehab from 05/15/2020 in [facility] Cardiac and Pulmonary Rehab  Referring Provider Bartholome Bill MD      Initial Encounter Date:  Flowsheet Row Cardiac Rehab from 05/15/2020 in [facility] Cardiac and Pulmonary Rehab  Date 05/15/20      Visit Diagnosis: S/P CABG x 3  Patient's Home Medications on Admission:  Current Outpatient Medications:  .  ACCU-CHEK GUIDE test strip, Please specify directions, refills and quantity, Disp: 100 each, Rfl: 11 .  albuterol (VENTOLIN HFA) 108 (90 Base) MCG/ACT inhaler, TAKE 2 PUFFS BY MOUTH EVERY 6 HOURS AS NEEDED FOR WHEEZE OR SHORTNESS OF BREATH (Patient not taking: No sig reported), Disp: 18 g, Rfl: 2 .  amLODipine (NORVASC) 5 MG tablet, TAKE 1 TABLET BY MOUTH EVERY DAY, Disp: 90 tablet, Rfl: 0 .  aspirin EC 81 MG tablet, Take 81 mg by mouth daily. , Disp: , Rfl:  .  atorvastatin (LIPITOR) 40 MG tablet, Take 40 mg by mouth at bedtime., Disp: , Rfl:  .  Blood Glucose Monitoring Suppl (ACCU-CHEK NANO SMARTVIEW) w/Device KIT, 1 strip by Does not apply route daily., Disp: 1 kit, Rfl: 0 .  glipiZIDE (GLUCOTROL XL) 10 MG 24 hr tablet, Take by mouth. (Patient not taking: No sig reported), Disp: , Rfl:  .  glucose blood test strip, 1 each by Other route as needed for other. Use as instructed, Disp: , Rfl:  .  lisinopril (ZESTRIL) 40 MG tablet, TAKE 1 TABLET (40 MG TOTAL) BY MOUTH DAILY. (Patient not taking: Reported on 09/04/2020), Disp: 90 tablet, Rfl: 3 .  metFORMIN (GLUCOPHAGE) 1000 MG tablet, TAKE 1 TABLET BY MOUTH 2 TIMES DAILY WITH A MEAL., Disp: 180 tablet, Rfl: 0 .  metoprolol tartrate (LOPRESSOR) 25 MG tablet, Take by mouth., Disp: , Rfl:  .  nitroGLYCERIN (NITROSTAT) 0.4 MG SL tablet, SMARTSIG:1 Tablet(s) Sublingual PRN, Disp: , Rfl:  .  ONETOUCH DELICA LANCETS 74J MISC, 2 (two) times daily. for testing,  Disp: , Rfl: 12 .  SYMBICORT 80-4.5 MCG/ACT inhaler, TAKE 2 PUFFS BY MOUTH TWICE A DAY (Patient not taking: No sig reported), Disp: 30.6 Inhaler, Rfl: 1 .  tadalafil (CIALIS) 20 MG tablet, Take 1 tablet (20 mg total) by mouth every three (3) days as needed for erectile dysfunction., Disp: 30 tablet, Rfl: 5 .  TRULICITY 2.87 OM/7.6HM SOPN, SMARTSIG:0.5 Milliliter(s) SUB-Q Once a Week, Disp: 12 mL, Rfl: 3  Past Medical History: Past Medical History:  Diagnosis Date  . Diabetes mellitus without complication ([place])   . Hypertension     Tobacco Use: Social History   Tobacco Use  Smoking Status Never Smoker  Smokeless Tobacco Never Used    Labs: Recent Review Flowsheet Data    Labs for ITP Cardiac and Pulmonary Rehab Latest Ref Rng & Units 03/03/2019 07/18/2019 11/14/2019 05/23/2020 08/07/2020   Cholestrol 100 - 199 mg/dL 176 - - - 102   LDLCALC 0 - 99 mg/dL 80 - - - 40   HDL >39 mg/dL 30(L) - - - 31(L)   Trlycerides 0 - 149 mg/dL 329(H) - - - 188(H)   Hemoglobin A1c 4.8 - 5.6 % 7.0(A) 7.0(A) 7.3(A) 5.9(A) 6.2(H)       Exercise Target Goals: Exercise Program Goal: Individual exercise prescription set using results from initial 6 min walk test and THRR while considering  patient's activity barriers and safety.  Exercise Prescription Goal: Initial exercise prescription builds to 30-45 minutes a day of aerobic activity, 2-3 days per week.  Home exercise guidelines will be given to patient during program as part of exercise prescription that the participant will acknowledge.   Education: Aerobic Exercise: - Group verbal and visual presentation on the components of exercise prescription. Introduces F.I.T.T principle from ACSM for exercise prescriptions.  Reviews F.I.T.T. principles of aerobic exercise including progression. Written material given at graduation.   Education: Resistance Exercise: - Group verbal and visual presentation on the components of exercise prescription. Introduces  F.I.T.T principle from ACSM for exercise prescriptions  Reviews F.I.T.T. principles of resistance exercise including progression. Written material given at graduation.    Education: Exercise & Equipment Safety: - Individual verbal instruction and demonstration of equipment use and safety with use of the equipment. Flowsheet Row Cardiac Rehab from 05/23/2020 in [facility] Cardiac and Pulmonary Rehab  Date 05/15/20  Educator [facility]  Instruction Review Code 1- Verbalizes Understanding      Education: Exercise Physiology & General Exercise Guidelines: - Group verbal and written instruction with models to review the exercise physiology of the cardiovascular system and associated critical values. Provides general exercise guidelines with specific guidelines to those with heart or lung disease.  Flowsheet Row Cardiac Rehab from 08/16/2020 in [facility] Cardiac and Pulmonary Rehab  Date 06/21/20  Educator [facility]  Instruction Review Code 1- Verbalizes Understanding      Education: Flexibility, Balance, Mind/Body Relaxation: - Group verbal and visual presentation with interactive activity on the components of exercise prescription. Introduces F.I.T.T principle from ACSM for exercise prescriptions. Reviews F.I.T.T. principles of flexibility and balance exercise training including progression. Also discusses the mind body connection.  Reviews various relaxation techniques to help reduce and manage stress (i.e. Deep breathing, progressive muscle relaxation, and visualization). Balance handout provided to take home. Written material given at graduation. Flowsheet Row Cardiac Rehab from 08/16/2020 in [facility] Cardiac and Pulmonary Rehab  Date 07/12/20  Educator [facility]  Instruction Review Code 1- Verbalizes Understanding      Activity Barriers & Risk Stratification:  Activity Barriers & Cardiac Risk Stratification - 05/15/20 1310      Activity Barriers & Cardiac Risk Stratification   Activity Barriers Deconditioning;Muscular  Weakness;Balance Concerns    Cardiac Risk Stratification High           6 Minute Walk:  6 Minute Walk    Row Name 05/15/20 1307         6 Minute Walk   Phase Initial     Distance 1235 feet     Walk Time 6 minutes     # of Rest Breaks 0     MPH 2.34     METS 3.1     RPE 8     VO2 Peak 10.84     Symptoms No     Resting HR 86 bpm     Resting BP 124/62     Resting Oxygen Saturation  98 %     Exercise Oxygen Saturation  during 6 min walk 99 %     Max Ex. HR 114 bpm     Max Ex. BP 130/64     2 Minute Post BP 134/70            Oxygen Initial Assessment:   Oxygen Re-Evaluation:   Oxygen Discharge (Final Oxygen Re-Evaluation):   Initial Exercise Prescription:  Initial Exercise Prescription - 05/15/20 1300      Date of Initial Exercise RX and Referring  Provider   Date 05/15/20    Referring Provider Bartholome Bill MD      Treadmill   MPH 2.3    Grade 1    Minutes 15    METs 3.08      Elliptical   Level 1    Speed 2.7    Minutes 15    METs 3      REL-XR   Level 3    Speed 50    Minutes 15    METs 3      Prescription Details   Frequency (times per week) 2    Duration Progress to 30 minutes of continuous aerobic without signs/symptoms of physical distress      Intensity   THRR 40-80% of Max Heartrate 115-144    Ratings of Perceived Exertion 11-13    Perceived Dyspnea 0-4      Progression   Progression Continue to progress workloads to maintain intensity without signs/symptoms of physical distress.      Resistance Training   Training Prescription Yes    Weight 3 lb    Reps 10-15           Perform Capillary Blood Glucose checks as needed.  Exercise Prescription Changes:  Exercise Prescription Changes    Row Name 05/15/20 1300 05/23/20 0700 05/30/20 0700 06/11/20 1700 06/26/20 0800     Response to Exercise   Blood Pressure (Admit) 124/62 -- 128/70 132/72 134/72   Blood Pressure (Exercise) 130/64 -- 114/54 140/68 134/70   Blood Pressure  (Exit) 134/70 -- 118/72 130/70 118/64   Heart Rate (Admit) 86 bpm -- 82 bpm 84 bpm 76 bpm   Heart Rate (Exercise) 114 bpm -- 107 bpm 116 bpm 92 bpm   Heart Rate (Exit) 87 bpm -- 91 bpm 82 bpm 79 bpm   Oxygen Saturation (Admit) 98 % -- -- -- --   Oxygen Saturation (Exercise) 99 % -- -- -- --   Rating of Perceived Exertion (Exercise) 8 -- 12 15 13    Symptoms none -- none none none   Comments walk test results -- -- -- --   Duration -- -- Continue with 30 min of aerobic exercise without signs/symptoms of physical distress. Continue with 30 min of aerobic exercise without signs/symptoms of physical distress. Continue with 30 min of aerobic exercise without signs/symptoms of physical distress.   Intensity -- -- THRR unchanged THRR unchanged THRR unchanged     Progression   Progression -- -- Continue to progress workloads to maintain intensity without signs/symptoms of physical distress. Continue to progress workloads to maintain intensity without signs/symptoms of physical distress. Continue to progress workloads to maintain intensity without signs/symptoms of physical distress.   Average METs -- -- 3.52 3.4 3.63     Resistance Training   Training Prescription -- -- Yes Yes Yes   Weight -- -- 3 lb 5 lb 6 lb   Reps -- -- 10-15 10-15 10-15     Interval Training   Interval Training -- -- No No No     Treadmill   MPH -- -- 2.5 2.5 2.7   Grade -- -- 1.5 1.5 2.5   Minutes -- -- 15 15 15    METs -- -- 3.43 3.43 4     Recumbant Elliptical   Level -- -- -- -- 2.3   Minutes -- -- -- -- 15   METs -- -- -- -- 2.7     Elliptical   Level -- -- 1 1 --  Speed -- -- 2.7 2.7 --   Minutes -- -- 5 4 --     REL-XR   Level -- -- 5 15 10    Minutes -- -- 15 15 15    METs -- -- 3.6 -- 4.2     Home Exercise Plan   Plans to continue exercise at -- Home (comment)  walking, bike, elliptical, weights Home (comment)  walking, bike, elliptical, weights Home (comment)  walking, bike, elliptical, weights Home  (comment)  walking, bike, elliptical, weights   Frequency -- Add 3 additional days to program exercise sessions. Add 3 additional days to program exercise sessions. Add 3 additional days to program exercise sessions. Add 3 additional days to program exercise sessions.   Initial Home Exercises Provided -- 05/23/20 05/23/20 -- 05/23/20   Row Name 07/09/20 1300 07/23/20 1400 08/06/20 1200 08/22/20 1000       Response to Exercise   Blood Pressure (Admit) 138/72 124/62 130/68 136/72    Blood Pressure (Exercise) 150/68 128/74 144/68 144/68    Blood Pressure (Exit) 132/70 120/64 108/56 118/70    Heart Rate (Admit) 82 bpm 70 bpm 75 bpm 75 bpm    Heart Rate (Exercise) 114 bpm 103 bpm 112 bpm 95 bpm    Heart Rate (Exit) 85 bpm 76 bpm 74 bpm 81 bpm    Rating of Perceived Exertion (Exercise) 13 14 13 13     Symptoms none none none none    Duration Continue with 30 min of aerobic exercise without signs/symptoms of physical distress. Continue with 30 min of aerobic exercise without signs/symptoms of physical distress. Continue with 30 min of aerobic exercise without signs/symptoms of physical distress. Continue with 30 min of aerobic exercise without signs/symptoms of physical distress.    Intensity THRR unchanged THRR unchanged THRR unchanged THRR unchanged         Progression   Progression Continue to progress workloads to maintain intensity without signs/symptoms of physical distress. Continue to progress workloads to maintain intensity without signs/symptoms of physical distress. Continue to progress workloads to maintain intensity without signs/symptoms of physical distress. Continue to progress workloads to maintain intensity without signs/symptoms of physical distress.    Average METs 5.8 4.55 5.7 5.37         Resistance Training   Training Prescription Yes Yes Yes Yes    Weight 6 lb 6 lb 6 lb 6 lb    Reps 10-15 10-15 10-15 10-15         Interval Training   Interval Training No No No No          Treadmill   MPH 2.8 2.7 2.7 2.8    Grade 2.5 2.5 3.5 3    Minutes 15 15 15 15     METs 4 4 4.37 4.3         Recumbant Elliptical   Level -- -- -- 5.6    Minutes -- -- -- 15    METs -- -- -- 4.6         REL-XR   Level 12 12 12 12     Minutes 15 15 15 15     METs 5.8 5.1 7.1 7.2         Home Exercise Plan   Plans to continue exercise at Home (comment)  walking, bike, elliptical, weights Home (comment)  walking, bike, elliptical, weights -- Home (comment)  walking, bike, elliptical, weights    Frequency Add 3 additional days to program exercise sessions. Add 3 additional days to program exercise  sessions. -- Add 3 additional days to program exercise sessions.    Initial Home Exercises Provided 05/23/20 05/23/20 -- 05/23/20           Exercise Comments:  Exercise Comments    Row Name 05/17/20 [phone]           Exercise Comments First full day of exercise!  Patient was oriented to gym and equipment including functions, settings, policies, and procedures.  Patient's individual exercise prescription and treatment plan were reviewed.  All starting workloads were established based on the results of the 6 minute walk test done at initial orientation visit.  The plan for exercise progression was also introduced and progression will be customized based on patient's performance and goals.              Exercise Goals and Review:  Exercise Goals    Row Name 05/15/20 1314             Exercise Goals   Increase Physical Activity Yes       Intervention Provide advice, education, support and counseling about physical activity/exercise needs.;Develop an individualized exercise prescription for aerobic and resistive training based on initial evaluation findings, risk stratification, comorbidities and participant's personal goals.       Expected Outcomes Short Term: Attend rehab on a regular basis to increase amount of physical activity.;Long Term: Add in home exercise to make exercise  part of routine and to increase amount of physical activity.;Long Term: Exercising regularly at least 3-5 days a week.       Increase Strength and Stamina Yes       Intervention Develop an individualized exercise prescription for aerobic and resistive training based on initial evaluation findings, risk stratification, comorbidities and participant's personal goals.;Provide advice, education, support and counseling about physical activity/exercise needs.       Expected Outcomes Short Term: Increase workloads from initial exercise prescription for resistance, speed, and METs.;Short Term: Perform resistance training exercises routinely during rehab and add in resistance training at home;Long Term: Improve cardiorespiratory fitness, muscular endurance and strength as measured by increased METs and functional capacity (6MWT)       Able to understand and use rate of perceived exertion (RPE) scale Yes       Intervention Provide education and explanation on how to use RPE scale       Expected Outcomes Short Term: Able to use RPE daily in rehab to express subjective intensity level;Long Term:  Able to use RPE to guide intensity level when exercising independently       Able to understand and use Dyspnea scale Yes       Intervention Provide education and explanation on how to use Dyspnea scale       Expected Outcomes Short Term: Able to use Dyspnea scale daily in rehab to express subjective sense of shortness of breath during exertion;Long Term: Able to use Dyspnea scale to guide intensity level when exercising independently       Knowledge and understanding of Target Heart Rate Range (THRR) Yes       Intervention Provide education and explanation of THRR including how the numbers were predicted and where they are located for reference       Expected Outcomes Short Term: Able to state/look up THRR;Short Term: Able to use daily as guideline for intensity in rehab;Long Term: Able to use THRR to govern intensity when  exercising independently       Able to check pulse independently Yes  Intervention Provide education and demonstration on how to check pulse in carotid and radial arteries.;Review the importance of being able to check your own pulse for safety during independent exercise       Expected Outcomes Short Term: Able to explain why pulse checking is important during independent exercise;Long Term: Able to check pulse independently and accurately       Understanding of Exercise Prescription Yes       Intervention Provide education, explanation, and written materials on patient's individual exercise prescription       Expected Outcomes Short Term: Able to explain program exercise prescription;Long Term: Able to explain home exercise prescription to exercise independently              Exercise Goals Re-Evaluation :  Exercise Goals Re-Evaluation    Row Name 05/17/20 0824 05/23/20 0736 05/29/20 0740 06/11/20 1718 06/14/20 0735     Exercise Goal Re-Evaluation   Exercise Goals Review Able to understand and use rate of perceived exertion (RPE) scale;Knowledge and understanding of Target Heart Rate Range (THRR);Able to understand and use Dyspnea scale;Understanding of Exercise Prescription Increase Physical Activity;Increase Strength and Stamina;Able to understand and use rate of perceived exertion (RPE) scale;Able to understand and use Dyspnea scale;Knowledge and understanding of Target Heart Rate Range (THRR);Understanding of Exercise Prescription;Able to check pulse independently Increase Physical Activity;Increase Strength and Stamina;Able to understand and use rate of perceived exertion (RPE) scale;Able to understand and use Dyspnea scale;Knowledge and understanding of Target Heart Rate Range (THRR);Understanding of Exercise Prescription;Able to check pulse independently Increase Physical Activity;Increase Strength and Stamina;Able to understand and use rate of perceived exertion (RPE) scale Increase  Physical Activity;Increase Strength and Stamina;Able to understand and use rate of perceived exertion (RPE) scale   Comments Reviewed RPE and dyspnea scales, THR and program prescription with pt today.  Pt voiced understanding and was given a copy of goals to take home. Reviewed home exercise with pt today.  Pt plans to use stationary bike and elliptical at home for exercise.  He will also continue to use his weights and walk when at beach too.  Reviewed THR, pulse, RPE, sign and symptoms, pulse oximetery and when to call 911 or MD.  Also discussed weather considerations and indoor options.  Pt voiced understanding. Allen hasnt tried to exercise at home yet.  He has an ellipitcal and bike at home.  He hasnt been sore since starting to exercise. Aleen tried the elliptical and made it 4 minutes !  he has increase to 5 lb for strength work.  Staff will monitor progress. Cycling at home and ellipital - usually 40 minutes1-2x/week. Bought weights (10 pounds) - before his heart event and will work up to them.   Expected Outcomes Short: Use RPE daily to regulate intensity. Long: Follow program prescription in THR. Short: Continue to exercise on off days  Long: Continue to improve stamina. Short:  add in home exercise Long: improve stamina and MET level Short: build up to 15 min on elliptical Long: improve overall MET level ST: increase exercise at home 1 extra day. LT: improve MET level   Row Name 06/26/20 0833 07/09/20 1333 07/23/20 1455 07/24/20 0723 08/06/20 1244     Exercise Goal Re-Evaluation   Exercise Goals Review Increase Physical Activity;Increase Strength and Stamina;Understanding of Exercise Prescription Increase Physical Activity;Increase Strength and Stamina;Understanding of Exercise Prescription Increase Physical Activity;Increase Strength and Stamina;Understanding of Exercise Prescription Increase Physical Activity;Increase Strength and Stamina;Understanding of Exercise Prescription Increase Physical  Activity;Increase Strength and Stamina  Comments Antony Haste has been doing well in rehab.  He is now up to level  2.3 on the recumbent elliptical!  We will continue to monitor his progress. Zenia Resides continues to progress well.  He is on level 12 on the XR and averaged 5.8 METs.  He continues to use 6 lb for strength work.  He has tried split squats during strength work for a challenge. Zenia Resides was at [employer] last week, walking everywhere.  He has been doing well in rehab.  He continues at level 12 on the XR.  We will continue to monitor his progress. Zenia Resides is doing well with exercise at home. He is riding his recumbant bike and doing the elliptical at home for 3 days/week on his off days from rehab. He totals about 20 minutes for one session. Discussed increasing his total exercise sessions to 30 minutes. He uses 10 lbs at home for handweights. Zenia Resides is progressing well and has increased incline on TM to 3.5%.  He has moved up to 6 lb on weights.   Expected Outcomes Short: Talk about adding in intervals  Long: Continue to improve stamina. Short: add intervals Long: improve MET level Short: Talk about intervals again upon return.  Long: Continue to improve stamina. Short: Increase exercise duration sessions up to 30 minutes at home Long: Exercise independently at home with no complications or symptoms Short:  continue to exercise consistently at home and HT Long:  continue to improve stamina   Row Name 08/14/20 0748 08/22/20 1010           Exercise Goal Re-Evaluation   Exercise Goals Review Increase Physical Activity;Increase Strength and Stamina;Understanding of Exercise Prescription Increase Physical Activity;Increase Strength and Stamina;Understanding of Exercise Prescription      Comments Zenia Resides is doing well in rehab.  He usually walks at the beach when he's there.  He will also do hist bike and elliptical at home.  He walks the dogs daily as well.  He feels that his strength and stamina continue to improve.  He  says he feels the best he has in 20 years. Zenia Resides has been doing well in rehab.  He is currently enjoying his new grandbaby. He is walking daily and up to level 12 on the XR.  We will continue to monitor his progress.      Expected Outcomes Short: Continue to make exercise a priority  Long: Continue to improve stamina. Short: Talk about intervals Long: Continue to improve stamina             Discharge Exercise Prescription (Final Exercise Prescription Changes):  Exercise Prescription Changes - 08/22/20 1000      Response to Exercise   Blood Pressure (Admit) 136/72    Blood Pressure (Exercise) 144/68    Blood Pressure (Exit) 118/70    Heart Rate (Admit) 75 bpm    Heart Rate (Exercise) 95 bpm    Heart Rate (Exit) 81 bpm    Rating of Perceived Exertion (Exercise) 13    Symptoms none    Duration Continue with 30 min of aerobic exercise without signs/symptoms of physical distress.    Intensity THRR unchanged      Progression   Progression Continue to progress workloads to maintain intensity without signs/symptoms of physical distress.    Average METs 5.37      Resistance Training   Training Prescription Yes    Weight 6 lb    Reps 10-15      Interval Training   Interval Training No  Treadmill   MPH 2.8    Grade 3    Minutes 15    METs 4.3      Recumbant Elliptical   Level 5.6    Minutes 15    METs 4.6      REL-XR   Level 12    Minutes 15    METs 7.2      Home Exercise Plan   Plans to continue exercise at Home (comment)   walking, bike, elliptical, weights   Frequency Add 3 additional days to program exercise sessions.    Initial Home Exercises Provided 05/23/20           Nutrition:  Target Goals: Understanding of nutrition guidelines, daily intake of sodium <1565m, cholesterol <2067m calories 30% from fat and 7% or less from saturated fats, daily to have 5 or more servings of fruits and vegetables.  Education: All About Nutrition: -Group instruction  provided by verbal, written material, interactive activities, discussions, models, and posters to present general guidelines for heart healthy nutrition including fat, fiber, MyPlate, the role of sodium in heart healthy nutrition, utilization of the nutrition label, and utilization of this knowledge for meal planning. Follow up email sent as well. Written material given at graduation. Flow[facility]  Date 05/17/20  [facility] 1- Verbalizes Understanding  [need identified]      Biometrics:  Pre Biometrics - 05/15/20 1314      Pre Biometrics   Height 5' 9.5" (1.765 m)    Weight 221 lb 4.8 oz (100.4 kg)    BMI (Calculated) 32.22    Single Leg Stand 4.72 seconds            Nutrition Therapy Plan and Nutrition Goals:  Nutrition Therapy & Goals - 05/29/20 0817      Nutrition Therapy   Diet Heart healthy, Low Na, T2DM    Drug/Food Interactions Statins/Certain Fruits    Protein (specify units) 80g    Fiber 30 grams    Whole Grain Foods 3 servings    Saturated Fats 12 max. grams    Fruits and Vegetables 5 servings/day    Sodium 1.5 grams      Personal Nutrition Goals   Nutrition Goal ST: increase wate intake by 16oz per day  LT: Feel better 7/10 now - working on increasing energy    Comments Pt reports losing appetite and eating about half of his usual intake. B:OJ or low sugar grape juice and two pieces of toast and 2-3 slices of bacon (will not eat eggs). L: vary from meat with two vegetables to a sandwich. D: meat and two vegetables or sandwich or hamburger. S: small amount of ice cream. cooks vegetables steamed, baked, and grilled. Likes to eat salad with dark leafy greens as his salad base. Drinks elderberry juice. Eats a variety vegetables. Eats peanut butter and tries to eat more fruit. Tries to eat whole wheat bread, will sometimes have oatmeal. Pt reports high fat foods turn him off to  eating and has now switched to 2% milk. sugar free mountain dew and unsweetened or half and half tea. Pt reports that blood sugar has been good, only had lows right after surgery - now at low 100s. No longer on glipizide anymore, now just on trulicity and metformin. Pt report having rouble with drinking water, but wants to drink more.      Intervention Plan   Intervention Prescribe, educate  and counsel regarding individualized specific dietary modifications aiming towards targeted core components such as weight, hypertension, lipid management, diabetes, heart failure and other comorbidities.;Nutrition handout(s) given to patient.    Expected Outcomes Short Term Goal: Understand basic principles of dietary content, such as calories, fat, sodium, cholesterol and nutrients.;Short Term Goal: A plan has been developed with personal nutrition goals set during dietitian appointment.;Long Term Goal: Adherence to prescribed nutrition plan.           Nutrition Assessments:  Nutrition Assessments - 05/15/20 1326      MEDFICTS Scores   Pre Score 74          MEDIFICTS Score Key:  ?70 Need to make dietary changes   40-70 Heart Healthy Diet  ? 40 Therapeutic Level Cholesterol Diet   Picture Your Plate Scores:  <48 Unhealthy dietary pattern with much room for improvement.  41-50 Dietary pattern unlikely to meet recommendations for good health and room for improvement.  51-60 More healthful dietary pattern, with some room for improvement.   >60 Healthy dietary pattern, although there may be some specific behaviors that could be improved.    Nutrition Goals Re-Evaluation:  Nutrition Goals Re-Evaluation    Row Name 06/14/20 0726 07/24/20 0833 08/14/20 0755         Goals   Nutrition Goal ST: increase water intake by 16oz per day  LT: Feel better 7/10 now - working on increasing energy ST: increase water intake by 16oz per day  LT: Feel better 7/10 now - working on increasing energy ST:  increase water intake by 16oz per day  LT: Feel better 7/10 now - working on increasing energy     Comment Pt reports ST goal going "so so". He reports some days that he will drink more than others. Will still want to work towards this goal. Start bringing your water with you and to your exercise. No changes in diet aside from increased fruit and vegetable intake. Patient will continue to work on goals provided by RD. Patient does not report any new questions or concerns at this time. Zenia Resides is trying to increase his water intake.  He is making some changes and sticking with them.     Expected Outcome ST: increase water intake by 16oz per day  LT: Feel better 7/10 now - working on increasing energy ST: increase water intake by 16oz per day  LT: Feel better 7/10 now - working on increasing energy Short: Continue to work on water  Long: Continue to eat better.            Nutrition Goals Discharge (Final Nutrition Goals Re-Evaluation):  Nutrition Goals Re-Evaluation - 08/14/20 0755      Goals   Nutrition Goal ST: increase water intake by 16oz per day  LT: Feel better 7/10 now - working on increasing energy    Comment Zenia Resides is trying to increase his water intake.  He is making some changes and sticking with them.    Expected Outcome Short: Continue to work on water  Long: Continue to eat better.           Psychosocial: Target Goals: Acknowledge presence or absence of significant depression and/or stress, maximize coping skills, provide positive support system. Participant is able to verbalize types and ability to use techniques and skills needed for reducing stress and depression.   Education: Stress, Anxiety, and Depression - Group verbal and visual presentation to define topics covered.  Reviews how body is impacted by stress, anxiety,  and depression.  Also discusses healthy ways to reduce stress and to treat/manage anxiety and depression.  Written material given at graduation. Flowsheet Row  Cardiac Rehab from 08/16/2020 in [facility] Cardiac and Pulmonary Rehab  Date 06/14/20  Educator [facility]  Instruction Review Code 1- [employer] Understanding      Education: Sleep Hygiene -Provides group verbal and written instruction about how sleep can affect your health.  Define sleep hygiene, discuss sleep cycles and impact of sleep habits. Review good sleep hygiene tips.    Initial Review & Psychosocial Screening:  Initial Psych Review & Screening - 05/10/20 0939      Initial Review   Current issues with Current Stress Concerns    Source of Stress Concerns Family    Comments mother has dementia (living at [place])      [place]? Yes   wife, cousins that live near by (he's an only child)     Barriers   Psychosocial barriers to participate in program There are no identifiable barriers or psychosocial needs.      Screening Interventions   Interventions Encouraged to exercise;To provide support and resources with identified psychosocial needs;Provide feedback about the scores to participant    Expected Outcomes Short Term goal: Utilizing psychosocial counselor, staff and physician to assist with identification of specific Stressors or current issues interfering with healing process. Setting desired goal for each stressor or current issue identified.;Long Term Goal: Stressors or current issues are controlled or eliminated.;Short Term goal: Identification and review with participant of any Quality of Life or Depression concerns found by scoring the questionnaire.;Long Term goal: The participant improves quality of Life and PHQ9 Scores as seen by post scores and/or verbalization of changes           Quality of Life Scores:   Quality of Life - 05/15/20 1326      Quality of Life   Select Quality of Life      Quality of Life Scores   Health/Function Pre 29.57 %    Socioeconomic Pre 30 %    Psych/Spiritual Pre 30 %    Family Pre 30 %    GLOBAL Pre 29.82 %           Scores of 19 and below usually indicate a poorer quality of life in these areas.  A difference of  2-3 points is a clinically meaningful difference.  A difference of 2-3 points in the total score of the Quality of Life Index has been associated with significant improvement in overall quality of life, self-image, physical symptoms, and general health in studies assessing change in quality of life.  PHQ-9: Recent Review Flowsheet Data    Depression screen [facility] 2/9 08/07/2020 05/15/2020 07/18/2019 04/01/2017 04/01/2017   Decreased Interest 0 0 0 0 0   Down, Depressed, Hopeless 0 0 0 0 0   PHQ - 2 Score 0 0 0 0 0   Altered sleeping 0 0 - 2 -   Tired, decreased energy 0 0 - 1 -   Change in appetite 0 0 - 0 -   Feeling bad or failure about yourself  0 0 - 0 -   Trouble concentrating 0 0 - 0 -   Moving slowly or fidgety/restless 0 0 - 0 -   Suicidal thoughts 0 0 - 0 -   PHQ-9 Score 0 0 - 3 -   Difficult doing work/chores Not difficult at all Not difficult at all - - -  Interpretation of Total Score  Total Score Depression Severity:  1-4 = Minimal depression, 5-9 = Mild depression, 10-14 = Moderate depression, 15-19 = Moderately severe depression, 20-27 = Severe depression   Psychosocial Evaluation and Intervention:  Psychosocial Evaluation - 05/10/20 0946      Psychosocial Evaluation & Interventions   Interventions Encouraged to exercise with the program and follow exercise prescription    Comments 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  He is a good spirited man. He is an only child but has a great support system with his wife and cousins that live nearby.  His mother is now living in [place] [facility] with dementia, which got worse while he was having to undergo surgery.  Overall, he is doing very well and eager to get going again.  He wants to get back to his normal and be able to walk on the beach again without having to stop to catch his breath.  He sleeps well at night and has no  other major stressors currently.    Expected Outcomes Short: Attend rehab to build stamina  Long: Continue to cope well with recovery    Continue Psychosocial Services  Follow up required by staff           Psychosocial Re-Evaluation:  Psychosocial Re-Evaluation    [place] Name 05/29/20 [phone] 06/14/20 0728 07/24/20 0735 08/14/20 0750       Psychosocial Re-Evaluation   Current issues with Current Stress Concerns Current Stress Concerns Current Stress Concerns Current Stress Concerns    Comments Zenia Resides has no new stress concerns - says he has less stress since he got his taxes done. Zenia Resides has no new stress concerns - says he has less stress since he got his taxes mostly done and he is finishing them up now. He has planned travel for next year - [place], [place], [place], [place]. He is still having stress with his mom, but she is doing better with her care. Two daughters he has that he has no relationship with because of their mother. Wife is his support system as well as his cousins. He reports fishing  and cycling to help manage stress. 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 Zenia Resides just got back from [place] and had a really good time with his wife. He has a plan to go to [place] at the end of January. He continues to stress about his mom who currently lives in a home. He denies any other big stressors at this time. He has good support from his family. He is keeping busy right now decorating for the holidays. Zenia Resides enjoyed the [employer] at [employer] this past weekend.  He also did the Christmas Parade the week before down at the beach.  His [place] trip will be in February/March.  His mom is still biggest stressor and she is now in memory care at [facility].  He is doing well overall and enjoying the fesitivites of the holidays.  He is not looking forward to tax time this year.    Expected Outcomes Short: continue to exercise to manage stress Long: maintain  positive outlook Short: continue to exercise to manage stress Long: maintain positive outlook Short: Continue attendance with [facility] for stress management Long: Maintain overall posititive attitude Short: Continue to enjoy holidays and rest in his mom's care Long: Continue to stay positive    Interventions -- -- Encouraged to attend Cardiac Rehabilitation for the exercise Encouraged to  attend Cardiac Rehabilitation for the exercise    Continue Psychosocial Services  -- -- Follow up required by staff Follow up required by staff    Comments -- mother has dementia (living at [place]) -- --         Initial Review   Source of Stress Concerns -- None Identified;Family -- --           Psychosocial Discharge (Final Psychosocial Re-Evaluation):  Psychosocial Re-Evaluation - 08/14/20 0750      Psychosocial Re-Evaluation   Current issues with Current Stress Concerns    Comments Zenia Resides enjoyed the [place] at [employer] this past weekend.  He also did the Christmas Parade the week before down at the beach.  His [place] trip will be in February/March.  His mom is still biggest stressor and she is now in memory care at [facility].  He is doing well overall and enjoying the fesitivites of the holidays.  He is not looking forward to tax time this year.    Expected Outcomes Short: Continue to enjoy holidays and rest in his mom's care Long: Continue to stay positive    Interventions Encouraged to attend Cardiac Rehabilitation for the exercise    Continue Psychosocial Services  Follow up required by staff           Vocational Rehabilitation: Provide vocational rehab assistance to qualifying candidates.   Vocational Rehab Evaluation & Intervention:  Vocational Rehab - 05/10/20 0141      Initial Vocational Rehab Evaluation & Intervention   Assessment shows need for Vocational Rehabilitation No   retired          Education: Education Goals: Education classes will be provided on a variety of  topics geared toward better understanding of heart health and risk factor modification. Participant will state understanding/return demonstration of topics presented as noted by education test scores.  Learning Barriers/Preferences:  Learning Barriers/Preferences - 05/10/20 0301      Learning Barriers/Preferences   Learning Barriers Sight   glasses for reading   Learning Preferences None           General Cardiac Education Topics:  AED/CPR: - Group verbal and written instruction with the use of models to demonstrate the basic use of the AED with the basic ABC's of resuscitation.   Anatomy and Cardiac Procedures: - Group verbal and visual presentation and models provide information about basic cardiac anatomy and function. Reviews the testing methods done to diagnose heart disease and the outcomes of the test results. Describes the treatment choices: Medical Management, Angioplasty, or Coronary Bypass Surgery for treating various heart conditions including Myocardial Infarction, Angina, Valve Disease, and Cardiac Arrhythmias.  Written material given at graduation. Flowsheet Row Cardiac Rehab from 05/23/2020 in [facility] Cardiac and Pulmonary Rehab  Date 05/15/20  Instruction Review Code 3- Needs Reinforcement  [need identified]      Medication Safety: - Group verbal and visual instruction to review commonly prescribed medications for heart and lung disease. Reviews the medication, class of the drug, and side effects. Includes the steps to properly store meds and maintain the prescription regimen.  Written material given at graduation. Flowsheet Row Cardiac Rehab from 05/23/2020 in [facility] Cardiac and Pulmonary Rehab  Date 05/23/20  Educator SB  Instruction Review Code 1- Verbalizes Understanding      Intimacy: - Group verbal instruction through game format to discuss how heart and lung disease can affect sexual intimacy. Written material given at graduation..   Know Your Numbers and  Heart  Failure: - Group verbal and visual instruction to discuss disease risk factors for cardiac and pulmonary disease and treatment options.  Reviews associated critical values for Overweight/Obesity, Hypertension, Cholesterol, and Diabetes.  Discusses basics of heart failure: signs/symptoms and treatments.  Introduces Heart Failure Zone chart for action plan for heart failure.  Written material given at graduation. Flowsheet Row Cardiac Rehab from 08/16/2020 in [facility] Cardiac and Pulmonary Rehab  Date 07/31/20  Educator SB  Instruction Review Code 1- Verbalizes Understanding      Infection Prevention: - Provides verbal and written material to individual with discussion of infection control including proper hand washing and proper equipment cleaning during exercise session. Flowsheet Row Cardiac Rehab from 05/23/2020 in [facility] Cardiac and Pulmonary Rehab  Date 05/15/20  Educator [facility]  Instruction Review Code 1- Verbalizes Understanding      Falls Prevention: - Provides verbal and written material to individual with discussion of falls prevention and safety. Flowsheet Row Cardiac Rehab from 05/23/2020 in [facility] Cardiac and Pulmonary Rehab  Date 05/15/20  Educator [facility]  Instruction Review Code 1- Verbalizes Understanding      Other: -Provides group and verbal instruction on various topics (see comments)   Knowledge Questionnaire Score:  Knowledge Questionnaire Score - 05/15/20 1326      Knowledge Questionnaire Score   Pre Score 24/28 Education Focus: Angina, Nutrtition, Exercise           Core Components/Risk Factors/Patient Goals at Admission:  Personal Goals and Risk Factors at Admission - 05/15/20 1329      Core Components/Risk Factors/Patient Goals on Admission    Weight Management Yes;Obesity;Weight Loss    Intervention Weight Management: Develop a combined nutrition and exercise program designed to reach desired caloric intake, while maintaining appropriate intake of nutrient  and fiber, sodium and fats, and appropriate energy expenditure required for the weight goal.;Weight Management: Provide education and appropriate resources to help participant work on and attain dietary goals.;Weight Management/Obesity: Establish reasonable short term and long term weight goals.;Obesity: Provide education and appropriate resources to help participant work on and attain dietary goals.    Admit Weight 221 lb 4.8 oz (100.4 kg)    Goal Weight: Short Term 215 lb (97.5 kg)    Goal Weight: Long Term 200 lb (90.7 kg)    Expected Outcomes Short Term: Continue to assess and modify interventions until short term weight is achieved;Long Term: Adherence to nutrition and physical activity/exercise program aimed toward attainment of established weight goal;Weight Loss: Understanding of general recommendations for a balanced deficit meal plan, which promotes 1-2 lb weight loss per week and includes a negative energy balance of [phone] kcal/d;Understanding recommendations for meals to include 15-35% energy as protein, 25-35% energy from fat, 35-60% energy from carbohydrates, less than 223m of dietary cholesterol, 20-35 gm of total fiber daily;Understanding of distribution of calorie intake throughout the day with the consumption of 4-5 meals/snacks    Diabetes Yes    Intervention Provide education about signs/symptoms and action to take for hypo/hyperglycemia.;Provide education about proper nutrition, including hydration, and aerobic/resistive exercise prescription along with prescribed medications to achieve blood glucose in normal ranges: Fasting glucose 65-99 mg/dL    Expected Outcomes Short Term: Participant verbalizes understanding of the signs/symptoms and immediate care of hyper/hypoglycemia, proper foot care and importance of medication, aerobic/resistive exercise and nutrition plan for blood glucose control.;Long Term: Attainment of HbA1C < 7%.    Hypertension Yes    Intervention Provide  education on lifestyle modifcations including regular physical activity/exercise, weight management, moderate  sodium restriction and increased consumption of fresh fruit, vegetables, and low fat dairy, alcohol moderation, and smoking cessation.;Monitor prescription use compliance.    Expected Outcomes Short Term: Continued assessment and intervention until BP is < 140/81m HG in hypertensive participants. < 130/854mHG in hypertensive participants with diabetes, heart failure or chronic kidney disease.;Long Term: Maintenance of blood pressure at goal levels.    Lipids Yes    Intervention Provide education and support for participant on nutrition & aerobic/resistive exercise along with prescribed medications to achieve LDL <7038mHDL >20m7m  Expected Outcomes Short Term: Participant states understanding of desired cholesterol values and is compliant with medications prescribed. Participant is following exercise prescription and nutrition guidelines.;Long Term: Cholesterol controlled with medications as prescribed, with individualized exercise RX and with personalized nutrition plan. Value goals: LDL < 70mg6mL > 40 mg.           Education:Diabetes - Individual verbal and written instruction to review signs/symptoms of diabetes, desired ranges of glucose level fasting, after meals and with exercise. Acknowledge that pre and post exercise glucose checks will be done for 3 sessions at entry of program. [facility]  Date 05/15/20  Educator [facility]ruction Review Code 1- Verbalizes Understanding      Core Components/Risk Factors/Patient Goals Review:   Goals and Risk Factor Review    Row Name 05/29/20 0739 05/31/20 0728 06/14/20 0733 07/24/20 0728 08/14/20 0758     Core Components/Risk Factors/Patient Goals Review   Personal Goals Review Weight Management/Obesity;Hypertension;Diabetes;Lipids Weight  Management/Obesity;Hypertension;Diabetes;Lipids Weight Management/Obesity;Hypertension;Diabetes;Lipids Weight Management/Obesity;Hypertension;Diabetes Weight Management/Obesity;Hypertension;Diabetes   Review Allen checks his BG at home on days not at HT.  [facility] around 110.  He reports taking meds as directed. -- Allen checks his BG at home on days not at [facility]. [facility] 101 in am. He reports taking meds as directed. Weight is staying stable, AllenZenia Residesrying to maintain his weight and is happy where he is. He lost weight after his surgery and has stayed about the same since. He is checking his sugars everyday and have been staying around 110-120. He does recognize symptoms if he is too low. He does not check his BP at home as he does not have a cuff. BPs have been ranging [phone] [facility] all [facility]. AllenZenia Residesoing well in rehab. His weight has continued to be stable.  His pressures have been up a little bit but staying within range other than after this weekend of drinking.  His sugars have continued to do well with all of his metformin.   Expected Outcomes Short: attend HT consistently Long: manage risk factors on his own -- Short: attend HT consistently Long: manage risk factors on his own Short: Continue checking blood sugars and weights regularly Long: Manage lifestyle risk factors Short: Continue to monitor sugars closely Long: Continue to monitor risk factors.          Core Components/Risk Factors/Patient Goals at Discharge (Final Review):   Goals and Risk Factor Review - 08/14/20 0758      Core Components/Risk Factors/Patient Goals Review   Personal Goals Review Weight Management/Obesity;Hypertension;Diabetes    Review AllenZenia Residesoing well in rehab. His weight has continued to be stable.  His pressures have been up a little bit but staying within range other than after this weekend of drinking.  His sugars  have  continued to do well with all of his metformin.    Expected Outcomes Short: Continue to monitor sugars closely Long: Continue to monitor risk factors.           ITP Comments:  ITP Comments    Row Name 05/10/20 0949 05/15/20 1304 05/16/20 1624 05/17/20 0824 06/13/20 0613   ITP Comments Completed virtual orientation today.  EP evaluation is scheduled for Tues 9/7 at 9am.  [facility] Completed 6MWT and gym orientation. Initial ITP created and sent for review to Dr. Emily Filbert, Medical Director. 30 day review completed. ITP sent to Dr. Emily Filbert, Medical Director of Cardiac and Pulmonary Rehab. Continue with ITP unless changes are made by physician. First full day of exercise!  Patient was oriented to gym and equipment including functions, settings, policies, and procedures.  Patient's individual exercise prescription and treatment plan were reviewed.  All starting workloads were established based on the results of the 6 minute walk test done at initial orientation visit.  The plan for exercise progression was also introduced and progression will be customized based on patient's performance and goals. 30 Day review completed. Medical Director ITP review done, changes made as directed, and signed approval by Medical Director.   [place] Name 07/11/20 0713 08/08/20 1040 09/05/20 0627       ITP Comments 30 Day review completed. Medical Director ITP review done, changes made as directed, and signed approval by Medical Director. 30 Day review completed. Medical Director ITP review done, changes made as directed, and signed approval by Medical Director. 30 Day review completed. Medical Director ITP review done, changes made as directed, and signed approval by Medical Director.            Comments:

## 2020-09-17 ENCOUNTER — Encounter: Payer: Self-pay | Admitting: *Deleted

## 2020-09-17 ENCOUNTER — Other Ambulatory Visit: Payer: Self-pay | Admitting: Family Medicine

## 2020-09-17 DIAGNOSIS — Z951 Presence of aortocoronary bypass graft: Secondary | ICD-10-CM

## 2020-09-17 DIAGNOSIS — I10 Essential (primary) hypertension: Secondary | ICD-10-CM

## 2020-09-18 ENCOUNTER — Other Ambulatory Visit
Admission: RE | Admit: 2020-09-18 | Discharge: 2020-09-18 | Disposition: A | Discharge status: Home / Self Care | Payer: 59 | Source: Ambulatory Visit | Attending: Gastroenterology | Admitting: Gastroenterology

## 2020-09-18 ENCOUNTER — Other Ambulatory Visit: Payer: Self-pay

## 2020-09-18 ENCOUNTER — Encounter: Payer: 59 | Attending: Cardiology | Admitting: *Deleted

## 2020-09-18 DIAGNOSIS — Z01812 Encounter for preprocedural laboratory examination: Secondary | ICD-10-CM | POA: Insufficient documentation

## 2020-09-18 DIAGNOSIS — Z951 Presence of aortocoronary bypass graft: Secondary | ICD-10-CM | POA: Insufficient documentation

## 2020-09-18 DIAGNOSIS — Z20822 Contact with and (suspected) exposure to covid-19: Secondary | ICD-10-CM | POA: Insufficient documentation

## 2020-09-18 LAB — SARS CORONAVIRUS 2 (TAT 6-24 HRS): SARS Coronavirus 2: NEGATIVE

## 2020-09-18 NOTE — Progress Notes (Signed)
Daily Session Note  Patient Details  Name: Reginald Murphy MRN: [id number] Date of Birth: 1956/05/30 Referring Provider:   Flowsheet Row Cardiac Rehab from 05/15/2020 in [facility]  Referring Provider Bartholome Bill MD      Encounter Date: 09/18/2020  Check In:  Session Check In - 09/18/20 0831      Check-In   Supervising physician immediately available to respond to emergencies See telemetry face sheet for immediately available ER MD    Location [facility]-Cardiac & Pulmonary Rehab    Staff Present Heath Lark, RN, BSN, Jacklynn Bue, MS Exercise Physiologist;Amanda Oletta Darter, [place], ACSM CEP, Exercise Physiologist    Virtual Visit No    Medication changes reported     No    Fall or balance concerns reported    No    Warm-up and Cool-down Performed on first and last piece of equipment    Resistance Training Performed Yes    VAD Patient? No    PAD/SET Patient? No      Pain Assessment   Currently in Pain? No/denies              Social History   Tobacco Use  Smoking Status Never Smoker  Smokeless Tobacco Never Used    Goals Met:  Independence with exercise equipment Exercise tolerated well No report of cardiac concerns or symptoms  Goals Unmet:  Not Applicable  Comments: Pt able to follow exercise prescription today without complaint.  Will continue to monitor for progression.    Dr. Emily Filbert is Medical Director for [facility].

## 2020-09-19 ENCOUNTER — Encounter: Payer: Self-pay | Admitting: Gastroenterology

## 2020-09-20 ENCOUNTER — Ambulatory Visit: Payer: 59 | Admitting: Certified Registered Nurse Anesthetist

## 2020-09-20 ENCOUNTER — Ambulatory Visit
Admission: RE | Admit: 2020-09-20 | Discharge: 2020-09-20 | Disposition: A | Discharge status: Home / Self Care | Payer: 59 | Attending: Gastroenterology | Admitting: Gastroenterology

## 2020-09-20 ENCOUNTER — Encounter: Payer: Self-pay | Admitting: Gastroenterology

## 2020-09-20 ENCOUNTER — Encounter
Admission: RE | Disposition: A | Discharge status: Home / Self Care | Payer: Self-pay | Source: Home / Self Care | Attending: Gastroenterology

## 2020-09-20 DIAGNOSIS — D125 Benign neoplasm of sigmoid colon: Secondary | ICD-10-CM | POA: Diagnosis not present

## 2020-09-20 DIAGNOSIS — K635 Polyp of colon: Secondary | ICD-10-CM

## 2020-09-20 DIAGNOSIS — D128 Benign neoplasm of rectum: Secondary | ICD-10-CM | POA: Insufficient documentation

## 2020-09-20 DIAGNOSIS — D122 Benign neoplasm of ascending colon: Secondary | ICD-10-CM | POA: Insufficient documentation

## 2020-09-20 DIAGNOSIS — Z7951 Long term (current) use of inhaled steroids: Secondary | ICD-10-CM | POA: Insufficient documentation

## 2020-09-20 DIAGNOSIS — Z7982 Long term (current) use of aspirin: Secondary | ICD-10-CM | POA: Diagnosis not present

## 2020-09-20 DIAGNOSIS — Z7984 Long term (current) use of oral hypoglycemic drugs: Secondary | ICD-10-CM | POA: Insufficient documentation

## 2020-09-20 DIAGNOSIS — D124 Benign neoplasm of descending colon: Secondary | ICD-10-CM | POA: Diagnosis not present

## 2020-09-20 DIAGNOSIS — Z1211 Encounter for screening for malignant neoplasm of colon: Secondary | ICD-10-CM | POA: Insufficient documentation

## 2020-09-20 DIAGNOSIS — Z79899 Other long term (current) drug therapy: Secondary | ICD-10-CM | POA: Diagnosis not present

## 2020-09-20 HISTORY — PX: COLONOSCOPY WITH PROPOFOL: SHX5780

## 2020-09-20 LAB — GLUCOSE, CAPILLARY: Glucose-Capillary: 130 mg/dL — ABNORMAL HIGH (ref 70–99)

## 2020-09-20 SURGERY — COLONOSCOPY WITH PROPOFOL
Anesthesia: General

## 2020-09-20 MED ORDER — PROPOFOL 500 MG/50ML IV EMUL
INTRAVENOUS | Status: DC | PRN
  Administered 2020-09-20: 150 ug/kg/min via INTRAVENOUS

## 2020-09-20 MED ORDER — SODIUM CHLORIDE 0.9 % IV SOLN: INTRAVENOUS | Status: DC

## 2020-09-20 MED ORDER — PHENYLEPHRINE HCL (PRESSORS) 10 MG/ML IV SOLN
INTRAVENOUS | Status: DC | PRN
  Administered 2020-09-20: 50 ug via INTRAVENOUS
  Administered 2020-09-20: 100 ug via INTRAVENOUS

## 2020-09-20 MED ORDER — LIDOCAINE HCL (CARDIAC) PF 100 MG/5ML IV SOSY
PREFILLED_SYRINGE | INTRAVENOUS | Status: DC | PRN
  Administered 2020-09-20: 100 mg via INTRAVENOUS

## 2020-09-20 MED ORDER — PROPOFOL 10 MG/ML IV BOLUS
INTRAVENOUS | Status: DC | PRN
  Administered 2020-09-20: 60 mg via INTRAVENOUS

## 2020-09-20 NOTE — Anesthesia Postprocedure Evaluation (Signed)
Anesthesia Post Note  Patient: Reginald Murphy  Procedure(s) Performed: COLONOSCOPY WITH PROPOFOL (N/A )  Patient location during evaluation: Endoscopy Anesthesia Type: General Level of consciousness: awake Pain management: pain level controlled Vital Signs Assessment: post-procedure vital signs reviewed and stable Respiratory status: spontaneous breathing Cardiovascular status: blood pressure returned to baseline Postop Assessment: no apparent nausea or vomiting Anesthetic complications: no   No complications documented.   Last Vitals:  Vitals:   09/20/20 0924 09/20/20 0934  BP: 106/67 122/82  Pulse:    Resp:    Temp:    SpO2:      Last Pain:  Vitals:   09/20/20 0934  TempSrc:   PainSc: 0-No pain                 Neva Seat

## 2020-09-20 NOTE — Op Note (Signed)
[facility]astroenterology Patient Name: Reginald Murphy Procedure Date: 09/20/2020 8:32 AM MRN: [id number] Account #: [id number] Date of Birth: 02/25/1956 Admit Type: Outpatient Age: 65 Room: [facility] ENDO ROOM 3 Gender: Male Note Status: Finalized Procedure:             Colonoscopy Indications:           Screening for colorectal malignant neoplasm Providers:             [name] MD, MD Referring MD:          Janine Ores. Rosanna Randy, MD (Referring MD) Medicines:             Monitored Anesthesia Care Complications:         No immediate complications. Procedure:             Pre-Anesthesia Assessment:                        - Prior to the procedure, a History and Physical was                         performed, and patient medications, allergies and                         sensitivities were reviewed. The patient's tolerance                         of previous anesthesia was reviewed.                        - The risks and benefits of the procedure and the                         sedation options and risks were discussed with the                         patient. All questions were answered and informed                         consent was obtained.                        - ASA Grade Assessment: II - A patient with mild                         systemic disease.                        After obtaining informed consent, the colonoscope was                         passed under direct vision. Throughout the procedure,                         the patient's blood pressure, pulse, and oxygen                         saturations were monitored continuously. The                         Colonoscope was introduced through the anus  and                         advanced to the the cecum, identified by the                         appendiceal orifice. The colonoscopy was performed                         with ease. The patient tolerated the procedure well.                         The quality  of the bowel preparation was excellent. Findings:      The perianal and digital rectal examinations were normal.      Four sessile polyps were found in the sigmoid colon. The polyps were 5       to 8 mm in size. These polyps were removed with a cold snare. Resection       and retrieval were complete.      Four sessile polyps were found in the ascending colon. The polyps were 4       to 8 mm in size. These polyps were removed with a cold snare. Resection       and retrieval were complete.      Two sessile polyps were found in the descending colon. The polyps were 4       to 5 mm in size. These polyps were removed with a cold snare. Resection       and retrieval were complete.      A 10 mm polyp was found in the rectum. The polyp was sessile. The polyp       was removed with a cold snare. Resection and retrieval were complete. To       prevent bleeding after the polypectomy, one hemostatic clip was       successfully placed. There was no bleeding at the end of the procedure.      The exam was otherwise without abnormality on direct and retroflexion       views. Impression:            - Four 5 to 8 mm polyps in the sigmoid colon, removed                         with a cold snare. Resected and retrieved.                        - Four 4 to 8 mm polyps in the ascending colon,                         removed with a cold snare. Resected and retrieved.                        - Two 4 to 5 mm polyps in the descending colon,                         removed with a cold snare. Resected and retrieved.                        - One 10 mm polyp in the rectum, removed with a  cold                         snare. Resected and retrieved. Clip was placed.                        - The examination was otherwise normal on direct and                         retroflexion views. Recommendation:        - Discharge patient to home (with escort).                        - Resume previous diet.                        -  Continue present medications.                        - Await pathology results.                        - Repeat colonoscopy for surveillance based on                         pathology results. Procedure Code(s):     --- Professional ---                        [phone], Colonoscopy, flexible; with removal of                         tumor(s), polyp(s), or other lesion(s) by snare                         technique Diagnosis Code(s):     --- Professional ---                        Z12.11, Encounter for screening for malignant neoplasm                         of colon                        K63.5, Polyp of colon                        K62.1, Rectal polyp CPT copyright 2019 American Medical Association. All rights reserved. The codes documented in this report are preliminary and upon coder review may  be revised to meet current compliance requirements. [name], MD [name] MD, MD 09/20/2020 9:14:40 AM This report has been signed electronically. Number of Addenda: 0 Note Initiated On: 09/20/2020 8:32 AM Scope Withdrawal Time: 0 hours 22 minutes 32 seconds  Total Procedure Duration: 0 hours 29 minutes 48 seconds  Estimated Blood Loss:  Estimated blood loss: none.      [facility]

## 2020-09-20 NOTE — H&P (Signed)
Reginald Bellows, MD [address] [place], [place], [place], [address] [address], [place], [place], [place], [address] Phone: [phone]  Fax: [phone]  Primary Care Physician:  Jerrol Banana., MD   Pre-Procedure History & Physical: HPI:  Reginald Murphy is a 65 y.o. male is here for an colonoscopy.   Past Medical History:  Diagnosis Date  . Diabetes mellitus without complication ([place])   . Hypertension     Past Surgical History:  Procedure Laterality Date  . LEFT HEART CATH AND CORONARY ANGIOGRAPHY Left 03/27/2020   Procedure: LEFT HEART CATH AND CORONARY ANGIOGRAPHY;  Surgeon: Teodoro Spray, MD;  Location: [facility];  Service: Cardiovascular;  Laterality: Left;    Prior to Admission medications   Medication Sig Start Date End Date Taking? Authorizing Provider  aspirin EC 81 MG tablet Take 81 mg by mouth daily.    Yes [provider]  metFORMIN (GLUCOPHAGE) 1000 MG tablet TAKE 1 TABLET BY MOUTH 2 TIMES DAILY WITH A MEAL. 07/05/20  Yes Jerrol Banana., MD  metoprolol tartrate (LOPRESSOR) 25 MG tablet Take by mouth. 04/12/20 04/12/21 Yes [provider]  ACCU-CHEK GUIDE test strip Please specify directions, refills and quantity 03/09/18   Jerrol Banana., MD  albuterol (VENTOLIN HFA) 108 (90 Base) MCG/ACT inhaler TAKE 2 PUFFS BY MOUTH EVERY 6 HOURS AS NEEDED FOR WHEEZE OR SHORTNESS OF BREATH Patient not taking: No sig reported 04/03/20   Jerrol Banana., MD  amLODipine (NORVASC) 5 MG tablet TAKE 1 TABLET BY MOUTH EVERY DAY Patient not taking: Reported on 09/20/2020 09/17/20   Jerrol Banana., MD  atorvastatin (LIPITOR) 40 MG tablet Take 40 mg by mouth at bedtime. Patient not taking: Reported on 09/20/2020 04/13/20   [provider]  Blood Glucose Monitoring Suppl (ACCU-CHEK NANO SMARTVIEW) w/Device KIT 1 strip by Does not apply route daily. 03/09/18   Jerrol Banana., MD  glipiZIDE (GLUCOTROL XL) 10  MG 24 hr tablet Take by mouth. Patient not taking: No sig reported 04/12/20 04/12/21  [provider]  glucose blood test strip 1 each by Other route as needed for other. Use as instructed    [provider]  lisinopril (ZESTRIL) 40 MG tablet TAKE 1 TABLET (40 MG TOTAL) BY MOUTH DAILY. Patient not taking: Reported on 09/04/2020 08/11/19   Jerrol Banana., MD  nitroGLYCERIN (NITROSTAT) 0.4 MG SL tablet SMARTSIG:1 Tablet(s) Sublingual PRN Patient not taking: Reported on 09/20/2020 04/18/20   [provider]  [facility] DELICA LANCETS 57Q MISC 2 (two) times daily. for testing 05/18/15   [provider]  SYMBICORT 80-4.5 MCG/ACT inhaler TAKE 2 PUFFS BY MOUTH TWICE A DAY Patient not taking: No sig reported 12/20/18   Jerrol Banana., MD  tadalafil (CIALIS) 20 MG tablet Take 1 tablet (20 mg total) by mouth every three (3) days as needed for erectile dysfunction. 11/14/19   Jerrol Banana., MD  TRULICITY 4.69 GE/9.5MW SOPN SMARTSIG:0.5 Milliliter(s) SUB-Q Once a Week Patient not taking: Reported on 09/20/2020 05/02/20   Jerrol Banana., MD    Allergies as of 09/04/2020  . (No Known Allergies)    Family History  Problem Relation Age of Onset  . Diabetes Mother   . Seizures Father   . Heart attack Maternal Grandmother   . Cancer Maternal Grandfather     Social History   Socioeconomic History  . Marital status:  Married    Spouse name: Juliann Pulse   . Number of children: 2  . Years of education: Not on file  . Highest education level: Not on file  Occupational History  . Not on file  Tobacco Use  . Smoking status: Never Smoker  . Smokeless tobacco: Never Used  Vaping Use  . Vaping Use: Never used  Substance and Sexual Activity  . Alcohol use: Yes    Alcohol/week: 0.0 standard drinks    Comment: occasionally  . Drug use: No  . Sexual activity: Not on file  Other Topics Concern  . Not on file  Social History Narrative  . Not on file    Social Determinants of Health   Financial Resource Strain: Not on file  Food Insecurity: Not on file  Transportation Needs: Not on file  Physical Activity: Not on file  Stress: Not on file  Social Connections: Not on file  Intimate Partner Violence: Not on file    Review of Systems: See HPI, otherwise negative ROS  Physical Exam: BP (!) 173/89   Pulse 77   Temp (!) 97 F (36.1 C) (Temporal)   Resp 18   Ht _0  (1.778 m)   Wt 103.4 kg   SpO2 100%   BMI 32.71 kg/m  General:   Alert,  pleasant and cooperative in NAD Head:  Normocephalic and atraumatic. Neck:  Supple; no masses or thyromegaly. Lungs:  Clear throughout to auscultation, normal respiratory effort.    Heart:  +S1, +S2, Regular rate and rhythm, No edema. Abdomen:  Soft, nontender and nondistended. Normal bowel sounds, without guarding, and without rebound.   Neurologic:  Alert and  oriented x4;  grossly normal neurologically.  Impression/Plan: Reginald Murphy is here for an colonoscopy to be performed for Screening colonoscopy average risk   Risks, benefits, limitations, and alternatives regarding  colonoscopy have been reviewed with the patient.  Questions have been answered.  All parties agreeable.   Reginald Bellows, MD  09/20/2020, 8:32 AM

## 2020-09-20 NOTE — Transfer of Care (Signed)
Immediate Anesthesia Transfer of Care Note  Patient: Reginald Murphy  Procedure(s) Performed: COLONOSCOPY WITH PROPOFOL (N/A )  Patient Location: PACU and Endoscopy Unit  Anesthesia Type:General  Level of Consciousness: drowsy  Airway & Oxygen Therapy: Patient Spontanous Breathing  Post-op Assessment: Report given to RN and Post -op Vital signs reviewed and stable  Post vital signs: Reviewed and stable  Last Vitals:  Vitals Value Taken Time  BP 103/69 09/20/20 0916  Temp    Pulse 90 09/20/20 0916  Resp 12 09/20/20 0916  SpO2 94 % 09/20/20 0916  Vitals shown include unvalidated device data.  Last Pain:  Vitals:   09/20/20 0739  TempSrc: Temporal  PainSc: 0-No pain         Complications: No complications documented.

## 2020-09-20 NOTE — Anesthesia Procedure Notes (Signed)
Procedure Name: MAC Date/Time: 09/20/2020 8:38 AM Performed by: Lily Peer, Iniko Robles, CRNA Pre-anesthesia Checklist: Patient identified, Emergency Drugs available, Suction available, Patient being monitored and Timeout performed Oxygen Delivery Method: Nasal cannula Induction Type: IV induction

## 2020-09-20 NOTE — Anesthesia Preprocedure Evaluation (Signed)
Anesthesia Evaluation  Patient identified by MRN, date of birth, ID band Patient awake    Reviewed: Allergy & Precautions, H&P , NPO status , Patient's Chart, lab work & pertinent test results  Airway Mallampati: II       Dental no notable dental hx. (+) Teeth Intact   Pulmonary neg pulmonary ROS,    Pulmonary exam normal breath sounds clear to auscultation       Cardiovascular hypertension, + CAD  Normal cardiovascular exam Rhythm:Regular Rate:Normal     Neuro/Psych negative neurological ROS  negative psych ROS   GI/Hepatic negative GI ROS, Neg liver ROS,   Endo/Other  negative endocrine ROSdiabetes  Renal/GU negative Renal ROS  negative genitourinary   Musculoskeletal negative musculoskeletal ROS (+)   Abdominal   Peds negative pediatric ROS (+)  Hematology negative hematology ROS (+)   Anesthesia Other Findings   Reproductive/Obstetrics negative OB ROS                             Anesthesia Physical Anesthesia Plan  ASA: II  Anesthesia Plan: General   Post-op Pain Management:    Induction:   PONV Risk Score and Plan: 2 and Propofol infusion  Airway Management Planned: Nasal Cannula  Additional Equipment:   Intra-op Plan:   Post-operative Plan:   Informed Consent: I have reviewed the patients History and Physical, chart, labs and discussed the procedure including the risks, benefits and alternatives for the proposed anesthesia with the patient or authorized representative who has indicated his/her understanding and acceptance.       Plan Discussed with: CRNA, Anesthesiologist and Surgeon  Anesthesia Plan Comments:         Anesthesia Quick Evaluation

## 2020-09-21 ENCOUNTER — Encounter: Payer: Self-pay | Admitting: Gastroenterology

## 2020-09-21 LAB — SURGICAL PATHOLOGY

## 2020-09-24 ENCOUNTER — Encounter: Payer: Self-pay | Admitting: Gastroenterology

## 2020-09-25 ENCOUNTER — Telehealth: Payer: Self-pay

## 2020-09-25 DIAGNOSIS — Z86010 Personal history of colonic polyps: Secondary | ICD-10-CM

## 2020-09-25 NOTE — Telephone Encounter (Signed)
Patient verbalized understanding of results. Place referral and put in recall for 1 year

## 2020-09-25 NOTE — Telephone Encounter (Signed)
-----   Message from Reginald Bellows, MD sent at 09/24/2020 10:56 AM EST ----- Inform that he had over 10 polyps which were adenomas i.e. precancerous.  It is not common to have as many polyps as he had.  Recommend referral to cancer center for genetic testing to find out why he has so many polyps.  If he has a genetic condition it may be associated with other cancers and he may need to be screened for that as well.  Often we do not find a reason for the same.    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

## 2020-10-01 ENCOUNTER — Other Ambulatory Visit: Payer: Self-pay | Admitting: Family Medicine

## 2020-10-02 ENCOUNTER — Other Ambulatory Visit: Payer: Self-pay

## 2020-10-02 ENCOUNTER — Encounter: Payer: 59 | Admitting: *Deleted

## 2020-10-02 VITALS — Ht 69.5 in | Wt 232.0 lb

## 2020-10-02 DIAGNOSIS — Z951 Presence of aortocoronary bypass graft: Secondary | ICD-10-CM

## 2020-10-02 NOTE — Progress Notes (Signed)
Daily Session Note  Patient Details  Name: Reginald Murphy MRN: [id number] Date of Birth: 11/28/1955 Referring Provider:   Flowsheet Row Cardiac Rehab from 05/15/2020 in [facility]  Referring Provider Fath, Kenneth MD      Encounter Date: 10/02/2020  Check In:  Session Check In - 10/02/20 0929      Check-In   Supervising physician immediately available to respond to emergencies See telemetry face sheet for immediately available ER MD    Location [facility]-Cardiac & Pulmonary Rehab    Staff Present Amanda Sommer, BA, ACSM CEP, Exercise Physiologist;Kara Langdon, MS Exercise Physiologist;Susanne Bice, RN, BSN, CCRP    Virtual Visit No    Medication changes reported     No    Fall or balance concerns reported    No    Warm-up and Cool-down Performed on first and last piece of equipment    Resistance Training Performed Yes    VAD Patient? No    PAD/SET Patient? No      Pain Assessment   Currently in Pain? No/denies              Social History   Tobacco Use  Smoking Status Never Smoker  Smokeless Tobacco Never Used    Goals Met:  Independence with exercise equipment Exercise tolerated well No report of cardiac concerns or symptoms  Goals Unmet:  Not Applicable  Comments: Pt able to follow exercise prescription today without complaint.  Will continue to monitor for progression.    Dr. Mark Miller is Medical Director for [facility] and [facility]. 

## 2020-10-03 ENCOUNTER — Encounter: Payer: Self-pay | Admitting: *Deleted

## 2020-10-03 DIAGNOSIS — Z951 Presence of aortocoronary bypass graft: Secondary | ICD-10-CM

## 2020-10-03 NOTE — Progress Notes (Signed)
Cardiac Individual Treatment Plan  Patient Details  Name: Reginald Murphy MRN: [id number] Date of Birth: September 18, 1955 Referring Provider:   Flowsheet Row Cardiac Rehab from 05/15/2020 in [facility] Cardiac and Pulmonary Rehab  Referring Provider Bartholome Bill MD      Initial Encounter Date:  Flowsheet Row Cardiac Rehab from 05/15/2020 in [facility] Cardiac and Pulmonary Rehab  Date 05/15/20      Visit Diagnosis: S/P CABG x 3  Patient's Home Medications on Admission:  Current Outpatient Medications:  .  ACCU-CHEK GUIDE test strip, Please specify directions, refills and quantity, Disp: 100 each, Rfl: 11 .  albuterol (VENTOLIN HFA) 108 (90 Base) MCG/ACT inhaler, TAKE 2 PUFFS BY MOUTH EVERY 6 HOURS AS NEEDED FOR WHEEZE OR SHORTNESS OF BREATH (Patient not taking: No sig reported), Disp: 18 g, Rfl: 2 .  amLODipine (NORVASC) 5 MG tablet, TAKE 1 TABLET BY MOUTH EVERY DAY (Patient not taking: Reported on 09/20/2020), Disp: 90 tablet, Rfl: 0 .  aspirin EC 81 MG tablet, Take 81 mg by mouth daily. , Disp: , Rfl:  .  atorvastatin (LIPITOR) 40 MG tablet, Take 40 mg by mouth at bedtime. (Patient not taking: Reported on 09/20/2020), Disp: , Rfl:  .  Blood Glucose Monitoring Suppl (ACCU-CHEK NANO SMARTVIEW) w/Device KIT, 1 strip by Does not apply route daily., Disp: 1 kit, Rfl: 0 .  glipiZIDE (GLUCOTROL XL) 10 MG 24 hr tablet, Take by mouth. (Patient not taking: No sig reported), Disp: , Rfl:  .  glucose blood test strip, 1 each by Other route as needed for other. Use as instructed, Disp: , Rfl:  .  lisinopril (ZESTRIL) 40 MG tablet, TAKE 1 TABLET (40 MG TOTAL) BY MOUTH DAILY. (Patient not taking: Reported on 09/04/2020), Disp: 90 tablet, Rfl: 3 .  metFORMIN (GLUCOPHAGE) 1000 MG tablet, TAKE 1 TABLET BY MOUTH 2 TIMES DAILY WITH A MEAL., Disp: 180 tablet, Rfl: 0 .  metoprolol tartrate (LOPRESSOR) 25 MG tablet, Take by mouth., Disp: , Rfl:  .  nitroGLYCERIN (NITROSTAT) 0.4 MG SL tablet, SMARTSIG:1 Tablet(s) Sublingual PRN  (Patient not taking: Reported on 09/20/2020), Disp: , Rfl:  .  ONETOUCH DELICA LANCETS 57W MISC, 2 (two) times daily. for testing, Disp: , Rfl: 12 .  SYMBICORT 80-4.5 MCG/ACT inhaler, TAKE 2 PUFFS BY MOUTH TWICE A DAY (Patient not taking: No sig reported), Disp: 30.6 Inhaler, Rfl: 1 .  tadalafil (CIALIS) 20 MG tablet, Take 1 tablet (20 mg total) by mouth every three (3) days as needed for erectile dysfunction., Disp: 30 tablet, Rfl: 5 .  TRULICITY 6.20 BT/5.9RC SOPN, SMARTSIG:0.5 Milliliter(s) SUB-Q Once a Week (Patient not taking: Reported on 09/20/2020), Disp: 12 mL, Rfl: 3  Past Medical History: Past Medical History:  Diagnosis Date  . Diabetes mellitus without complication ([place])   . Hypertension     Tobacco Use: Social History   Tobacco Use  Smoking Status Never Smoker  Smokeless Tobacco Never Used    Labs: Recent Review Flowsheet Data    Labs for ITP Cardiac and Pulmonary Rehab Latest Ref Rng & Units 03/03/2019 07/18/2019 11/14/2019 05/23/2020 08/07/2020   Cholestrol 100 - 199 mg/dL 176 - - - 102   LDLCALC 0 - 99 mg/dL 80 - - - 40   HDL >39 mg/dL 30(L) - - - 31(L)   Trlycerides 0 - 149 mg/dL 329(H) - - - 188(H)   Hemoglobin A1c 4.8 - 5.6 % 7.0(A) 7.0(A) 7.3(A) 5.9(A) 6.2(H)       Exercise Target Goals: Exercise Program Goal:  Individual exercise prescription set using results from initial 6 min walk test and THRR while considering  patient's activity barriers and safety.   Exercise Prescription Goal: Initial exercise prescription builds to 30-45 minutes a day of aerobic activity, 2-3 days per week.  Home exercise guidelines will be given to patient during program as part of exercise prescription that the participant will acknowledge.   Education: Aerobic Exercise: - Group verbal and visual presentation on the components of exercise prescription. Introduces F.I.T.T principle from ACSM for exercise prescriptions.  Reviews F.I.T.T. principles of aerobic exercise including  progression. Written material given at graduation.   Education: Resistance Exercise: - Group verbal and visual presentation on the components of exercise prescription. Introduces F.I.T.T principle from ACSM for exercise prescriptions  Reviews F.I.T.T. principles of resistance exercise including progression. Written material given at graduation.    Education: Exercise & Equipment Safety: - Individual verbal instruction and demonstration of equipment use and safety with use of the equipment. Flowsheet Row Cardiac Rehab from 05/23/2020 in [facility] Cardiac and Pulmonary Rehab  Date 05/15/20  Educator [facility]  Instruction Review Code 1- Verbalizes Understanding      Education: Exercise Physiology & General Exercise Guidelines: - Group verbal and written instruction with models to review the exercise physiology of the cardiovascular system and associated critical values. Provides general exercise guidelines with specific guidelines to those with heart or lung disease.  Flowsheet Row Cardiac Rehab from 08/16/2020 in [facility] Cardiac and Pulmonary Rehab  Date 06/21/20  Educator [facility]  Instruction Review Code 1- Verbalizes Understanding      Education: Flexibility, Balance, Mind/Body Relaxation: - Group verbal and visual presentation with interactive activity on the components of exercise prescription. Introduces F.I.T.T principle from ACSM for exercise prescriptions. Reviews F.I.T.T. principles of flexibility and balance exercise training including progression. Also discusses the mind body connection.  Reviews various relaxation techniques to help reduce and manage stress (i.e. Deep breathing, progressive muscle relaxation, and visualization). Balance handout provided to take home. Written material given at graduation. Flowsheet Row Cardiac Rehab from 08/16/2020 in [facility] Cardiac and Pulmonary Rehab  Date 07/12/20  Educator [facility]  Instruction Review Code 1- Verbalizes Understanding      Activity Barriers & Risk  Stratification:  Activity Barriers & Cardiac Risk Stratification - 05/15/20 1310      Activity Barriers & Cardiac Risk Stratification   Activity Barriers Deconditioning;Muscular Weakness;Balance Concerns    Cardiac Risk Stratification High           6 Minute Walk:  6 Minute Walk    Row Name 05/15/20 1307 10/02/20 0810       6 Minute Walk   Phase Initial Discharge    Distance 1235 feet 1625 feet    Distance % Change -- 31.5 %    Distance Feet Change -- 390 ft    Walk Time 6 minutes 6 minutes    # of Rest Breaks 0 0    MPH 2.34 3.08    METS 3.1 3.7    RPE 8 11    VO2 Peak 10.84 12.94    Symptoms No No    Resting HR 86 bpm 78 bpm    Resting BP 124/62 134/70    Resting Oxygen Saturation  98 % --    Exercise Oxygen Saturation  during 6 min walk 99 % --    Max Ex. HR 114 bpm 98 bpm    Max Ex. BP 130/64 154/66    2 Minute Post BP 134/70 --  Oxygen Initial Assessment:   Oxygen Re-Evaluation:   Oxygen Discharge (Final Oxygen Re-Evaluation):   Initial Exercise Prescription:  Initial Exercise Prescription - 05/15/20 1300      Date of Initial Exercise RX and Referring Provider   Date 05/15/20    Referring Provider Bartholome Bill MD      Treadmill   MPH 2.3    Grade 1    Minutes 15    METs 3.08      Elliptical   Level 1    Speed 2.7    Minutes 15    METs 3      REL-XR   Level 3    Speed 50    Minutes 15    METs 3      Prescription Details   Frequency (times per week) 2    Duration Progress to 30 minutes of continuous aerobic without signs/symptoms of physical distress      Intensity   THRR 40-80% of Max Heartrate 115-144    Ratings of Perceived Exertion 11-13    Perceived Dyspnea 0-4      Progression   Progression Continue to progress workloads to maintain intensity without signs/symptoms of physical distress.      Resistance Training   Training Prescription Yes    Weight 3 lb    Reps 10-15           Perform Capillary Blood  Glucose checks as needed.  Exercise Prescription Changes:  Exercise Prescription Changes    Row Name 05/15/20 1300 05/23/20 0700 05/30/20 0700 06/11/20 1700 06/26/20 0800     Response to Exercise   Blood Pressure (Admit) 124/62 -- 128/70 132/72 134/72   Blood Pressure (Exercise) 130/64 -- 114/54 140/68 134/70   Blood Pressure (Exit) 134/70 -- 118/72 130/70 118/64   Heart Rate (Admit) 86 bpm -- 82 bpm 84 bpm 76 bpm   Heart Rate (Exercise) 114 bpm -- 107 bpm 116 bpm 92 bpm   Heart Rate (Exit) 87 bpm -- 91 bpm 82 bpm 79 bpm   Oxygen Saturation (Admit) 98 % -- -- -- --   Oxygen Saturation (Exercise) 99 % -- -- -- --   Rating of Perceived Exertion (Exercise) 8 -- _0 Symptoms none -- none none none   Comments walk test results -- -- -- --   Duration -- -- Continue with 30 min of aerobic exercise without signs/symptoms of physical distress. Continue with 30 min of aerobic exercise without signs/symptoms of physical distress. Continue with 30 min of aerobic exercise without signs/symptoms of physical distress.   Intensity -- -- THRR unchanged THRR unchanged THRR unchanged     Progression   Progression -- -- Continue to progress workloads to maintain intensity without signs/symptoms of physical distress. Continue to progress workloads to maintain intensity without signs/symptoms of physical distress. Continue to progress workloads to maintain intensity without signs/symptoms of physical distress.   Average METs -- -- 3.52 3.4 3.63     Resistance Training   Training Prescription -- -- Yes Yes Yes   Weight -- -- 3 lb 5 lb 6 lb   Reps -- -- 10-15 10-15 10-15     Interval Training   Interval Training -- -- No No No     Treadmill   MPH -- -- 2.5 2.5 2.7   Grade -- -- 1.5 1.5 2.5   Minutes -- -- _1 METs -- -- 3.43 3.43 4     Recumbant Elliptical  Level -- -- -- -- 2.3   Minutes -- -- -- -- 15   METs -- -- -- -- 2.7     Elliptical   Level -- -- 1 1 --   Speed -- -- 2.7  2.7 --   Minutes -- -- 5 4 --     REL-XR   Level -- -- _0 Minutes -- -- _1 METs -- -- 3.6 -- 4.2     Home Exercise Plan   Plans to continue exercise at -- Home (comment)  walking, bike, elliptical, weights Home (comment)  walking, bike, elliptical, weights Home (comment)  walking, bike, elliptical, weights Home (comment)  walking, bike, elliptical, weights   Frequency -- Add 3 additional days to program exercise sessions. Add 3 additional days to program exercise sessions. Add 3 additional days to program exercise sessions. Add 3 additional days to program exercise sessions.   Initial Home Exercises Provided -- 05/23/20 05/23/20 -- 05/23/20   Row Name 07/09/20 1300 07/23/20 1400 08/06/20 1200 08/22/20 1000 09/05/20 0800     Response to Exercise   Blood Pressure (Admit) 138/72 124/62 130/68 136/72 128/72   Blood Pressure (Exercise) 150/68 128/74 144/68 144/68 130/76   Blood Pressure (Exit) 132/70 120/64 108/56 118/70 130/72   Heart Rate (Admit) 82 bpm 70 bpm 75 bpm 75 bpm 80 bpm   Heart Rate (Exercise) 114 bpm 103 bpm 112 bpm 95 bpm 103 bpm   Heart Rate (Exit) 85 bpm 76 bpm 74 bpm 81 bpm 82 bpm   Rating of Perceived Exertion (Exercise) _2 Symptoms _3    Duration Continue with 30 min of aerobic exercise without signs/symptoms of physical distress. Continue with 30 min of aerobic exercise without signs/symptoms of physical distress. Continue with 30 min of aerobic exercise without signs/symptoms of physical distress. Continue with 30 min of aerobic exercise without signs/symptoms of physical distress. Continue with 30 min of aerobic exercise without signs/symptoms of physical distress.   Intensity _4      Progression   Progression Continue to progress workloads to maintain intensity without signs/symptoms of physical distress. Continue to progress workloads to maintain  intensity without signs/symptoms of physical distress. Continue to progress workloads to maintain intensity without signs/symptoms of physical distress. Continue to progress workloads to maintain intensity without signs/symptoms of physical distress. Continue to progress workloads to maintain intensity without signs/symptoms of physical distress.   Average METs 5.8 4.55 5.7 5.37 4.8     Resistance Training   Training Prescription _5    Weight 6 lb 6 lb 6 lb 6 lb 5 lb   Reps 10-15 10-15 10-15 10-15 10-15     Interval Training   Interval Training _6      Treadmill   MPH 2.8 2.7 2.7 2.8 3   Grade 2.5 2.5 3._7 Minutes _8 METs 4 4 4.37 4.3 4.54     Recumbant Elliptical   Level -- -- -- 5.6 --   Minutes -- -- -- 15 --   METs -- -- -- 4.6 --     REL-XR   Level _9 Minutes _10 METs 5.8 5.1 7.1 7.2 5.1     Home Exercise Plan   Plans to continue  exercise at Home (comment)  walking, bike, elliptical, weights Home (comment)  walking, bike, elliptical, weights -- Home (comment)  walking, bike, elliptical, weights Home (comment)  walking, bike, elliptical, weights   Frequency Add 3 additional days to program exercise sessions. Add 3 additional days to program exercise sessions. -- Add 3 additional days to program exercise sessions. Add 3 additional days to program exercise sessions.   Initial Home Exercises Provided 05/23/20 05/23/20 -- 05/23/20 05/23/20   Row Name 10/02/20 1200             Response to Exercise   Blood Pressure (Admit) 134/70       Blood Pressure (Exercise) 154/66       Blood Pressure (Exit) 130/72       Heart Rate (Admit) 81 bpm       Heart Rate (Exercise) 109 bpm       Heart Rate (Exit) 90 bpm       Rating of Perceived Exertion (Exercise) 13       Symptoms none       Duration Continue with 30 min of aerobic exercise without signs/symptoms of physical distress.       Intensity THRR unchanged                Progression   Progression Continue to progress workloads to maintain intensity without signs/symptoms of physical distress.       Average METs 4.95               Resistance Training   Training Prescription Yes       Weight 6 lb       Reps 10-15               Interval Training   Interval Training No               Treadmill   MPH 3       Grade 3       Minutes 15       METs 4.54               REL-XR   Level 12       Speed 50       Minutes 15       METs 5.4               Home Exercise Plan   Plans to continue exercise at Home (comment)  walking, bike, elliptical, weights       Frequency Add 3 additional days to program exercise sessions.       Initial Home Exercises Provided 05/23/20              Exercise Comments:  Exercise Comments    Row Name 05/17/20 3545           Exercise Comments First full day of exercise!  Patient was oriented to gym and equipment including functions, settings, policies, and procedures.  Patient's individual exercise prescription and treatment plan were reviewed.  All starting workloads were established based on the results of the 6 minute walk test done at initial orientation visit.  The plan for exercise progression was also introduced and progression will be customized based on patient's performance and goals.              Exercise Goals and Review:  Exercise Goals    Row Name 05/15/20 1314             Exercise Goals   Increase Physical  Activity Yes       Intervention Provide advice, education, support and counseling about physical activity/exercise needs.;Develop an individualized exercise prescription for aerobic and resistive training based on initial evaluation findings, risk stratification, comorbidities and participant's personal goals.       Expected Outcomes Short Term: Attend rehab on a regular basis to increase amount of physical activity.;Long Term: Add in home exercise to make exercise part of routine and  to increase amount of physical activity.;Long Term: Exercising regularly at least 3-5 days a week.       Increase Strength and Stamina Yes       Intervention Develop an individualized exercise prescription for aerobic and resistive training based on initial evaluation findings, risk stratification, comorbidities and participant's personal goals.;Provide advice, education, support and counseling about physical activity/exercise needs.       Expected Outcomes Short Term: Increase workloads from initial exercise prescription for resistance, speed, and METs.;Short Term: Perform resistance training exercises routinely during rehab and add in resistance training at home;Long Term: Improve cardiorespiratory fitness, muscular endurance and strength as measured by increased METs and functional capacity (6MWT)       Able to understand and use rate of perceived exertion (RPE) scale Yes       Intervention Provide education and explanation on how to use RPE scale       Expected Outcomes Short Term: Able to use RPE daily in rehab to express subjective intensity level;Long Term:  Able to use RPE to guide intensity level when exercising independently       Able to understand and use Dyspnea scale Yes       Intervention Provide education and explanation on how to use Dyspnea scale       Expected Outcomes Short Term: Able to use Dyspnea scale daily in rehab to express subjective sense of shortness of breath during exertion;Long Term: Able to use Dyspnea scale to guide intensity level when exercising independently       Knowledge and understanding of Target Heart Rate Range (THRR) Yes       Intervention Provide education and explanation of THRR including how the numbers were predicted and where they are located for reference       Expected Outcomes Short Term: Able to state/look up THRR;Short Term: Able to use daily as guideline for intensity in rehab;Long Term: Able to use THRR to govern intensity when exercising  independently       Able to check pulse independently Yes       Intervention Provide education and demonstration on how to check pulse in carotid and radial arteries.;Review the importance of being able to check your own pulse for safety during independent exercise       Expected Outcomes Short Term: Able to explain why pulse checking is important during independent exercise;Long Term: Able to check pulse independently and accurately       Understanding of Exercise Prescription Yes       Intervention Provide education, explanation, and written materials on patient's individual exercise prescription       Expected Outcomes Short Term: Able to explain program exercise prescription;Long Term: Able to explain home exercise prescription to exercise independently              Exercise Goals Re-Evaluation :  Exercise Goals Re-Evaluation    Row Name 05/17/20 0824 05/23/20 0736 05/29/20 0740 06/11/20 1718 06/14/20 0735     Exercise Goal Re-Evaluation   Exercise Goals Review Able to understand and use rate  of perceived exertion (RPE) scale;Knowledge and understanding of Target Heart Rate Range (THRR);Able to understand and use Dyspnea scale;Understanding of Exercise Prescription Increase Physical Activity;Increase Strength and Stamina;Able to understand and use rate of perceived exertion (RPE) scale;Able to understand and use Dyspnea scale;Knowledge and understanding of Target Heart Rate Range (THRR);Understanding of Exercise Prescription;Able to check pulse independently Increase Physical Activity;Increase Strength and Stamina;Able to understand and use rate of perceived exertion (RPE) scale;Able to understand and use Dyspnea scale;Knowledge and understanding of Target Heart Rate Range (THRR);Understanding of Exercise Prescription;Able to check pulse independently Increase Physical Activity;Increase Strength and Stamina;Able to understand and use rate of perceived exertion (RPE) scale Increase Physical  Activity;Increase Strength and Stamina;Able to understand and use rate of perceived exertion (RPE) scale   Comments Reviewed RPE and dyspnea scales, THR and program prescription with pt today.  Pt voiced understanding and was given a copy of goals to take home. Reviewed home exercise with pt today.  Pt plans to use stationary bike and elliptical at home for exercise.  He will also continue to use his weights and walk when at beach too.  Reviewed THR, pulse, RPE, sign and symptoms, pulse oximetery and when to call 911 or MD.  Also discussed weather considerations and indoor options.  Pt voiced understanding. Reginald Murphy hasnt tried to exercise at home yet.  He has an ellipitcal and bike at home.  He hasnt been sore since starting to exercise. Aleen tried the elliptical and made it 4 minutes !  he has increase to 5 lb for strength work.  Staff will monitor progress. Cycling at home and ellipital - usually 40 minutes1-2x/week. Bought weights (10 pounds) - before his heart event and will work up to them.   Expected Outcomes Short: Use RPE daily to regulate intensity. Long: Follow program prescription in THR. Short: Continue to exercise on off days  Long: Continue to improve stamina. Short:  add in home exercise Long: improve stamina and MET level Short: build up to 15 min on elliptical Long: improve overall MET level ST: increase exercise at home 1 extra day. LT: improve MET level   Row Name 06/26/20 0833 07/09/20 1333 07/23/20 1455 07/24/20 0723 08/06/20 1244     Exercise Goal Re-Evaluation   Exercise Goals Review Increase Physical Activity;Increase Strength and Stamina;Understanding of Exercise Prescription Increase Physical Activity;Increase Strength and Stamina;Understanding of Exercise Prescription Increase Physical Activity;Increase Strength and Stamina;Understanding of Exercise Prescription Increase Physical Activity;Increase Strength and Stamina;Understanding of Exercise Prescription Increase Physical  Activity;Increase Strength and Stamina   Comments Reginald Murphy has been doing well in rehab.  He is now up to level  2.3 on the recumbent elliptical!  We will continue to monitor his progress. Reginald Murphy continues to progress well.  He is on level 12 on the XR and averaged 5.8 METs.  He continues to use 6 lb for strength work.  He has tried split squats during strength work for a challenge. Reginald Murphy was at [employer] last week, walking everywhere.  He has been doing well in rehab.  He continues at level 12 on the XR.  We will continue to monitor his progress. Reginald Murphy is doing well with exercise at home. He is riding his recumbant bike and doing the elliptical at home for 3 days/week on his off days from rehab. He totals about 20 minutes for one session. Discussed increasing his total exercise sessions to 30 minutes. He uses 10 lbs at home for handweights. Reginald Murphy is progressing well and has increased incline on TM  to 3.5%.  He has moved up to 6 lb on weights.   Expected Outcomes Short: Talk about adding in intervals  Long: Continue to improve stamina. Short: add intervals Long: improve MET level Short: Talk about intervals again upon return.  Long: Continue to improve stamina. Short: Increase exercise duration sessions up to 30 minutes at home Long: Exercise independently at home with no complications or symptoms Short:  continue to exercise consistently at home and HT Long:  continue to improve stamina   Row Name 08/14/20 0748 08/22/20 1010 09/05/20 0811 09/17/20 1136 09/18/20 0734     Exercise Goal Re-Evaluation   Exercise Goals Review Increase Physical Activity;Increase Strength and Stamina;Understanding of Exercise Prescription Increase Physical Activity;Increase Strength and Stamina;Understanding of Exercise Prescription Increase Physical Activity;Increase Strength and Stamina -- Increase Physical Activity;Increase Strength and Stamina   Comments Reginald Murphy is doing well in rehab.  He usually walks at the beach when he's there.  He  will also do hist bike and elliptical at home.  He walks the dogs daily as well.  He feels that his strength and stamina continue to improve.  He says he feels the best he has in 20 years. Reginald Murphy has been doing well in rehab.  He is currently enjoying his new grandbaby. He is walking daily and up to level 12 on the XR.  We will continue to monitor his progress. Reginald Murphy is now at 3 mph and 3% incline on TM.  We expect him to improve post 6 MWT. Out since last review Reginald Murphy is doing great. He is going to graduate in the next couple of weeks.and is still continuing to exercise at home. He still uses his  stationary bike and elliptical  an extra 2 days/week and walks almost daily. He will plan to continue that after graduation. He continues to  watch his HR and use RPE scale. He is pleased with his progress thus far in the program.   Expected Outcomes Short: Continue to make exercise a priority  Long: Continue to improve stamina. Short: Talk about intervals Long: Continue to improve stamina Short : improve post 6MWT Long:  complete HT program -- Short: Improve on 6MWT  Long: Graduate from [facility] Name 10/02/20 1235             Exercise Goal Re-Evaluation   Exercise Goals Review Increase Physical Activity;Increase Strength and Stamina       Comments Reginald Murphy improved post 6MWT by 31.5%  He will complete HT program soon and has exercise equipment at home.       Expected Outcomes Short: complete HT program Long:  maintain exercise on his own              Discharge Exercise Prescription (Final Exercise Prescription Changes):  Exercise Prescription Changes - 10/02/20 1200      Response to Exercise   Blood Pressure (Admit) 134/70    Blood Pressure (Exercise) 154/66    Blood Pressure (Exit) 130/72    Heart Rate (Admit) 81 bpm    Heart Rate (Exercise) 109 bpm    Heart Rate (Exit) 90 bpm    Rating of Perceived Exertion (Exercise) 13    Symptoms none    Duration Continue with 30 min of aerobic  exercise without signs/symptoms of physical distress.    Intensity THRR unchanged      Progression   Progression Continue to progress workloads to maintain intensity without signs/symptoms of physical distress.    Average METs 4.95  Resistance Training   Training Prescription Yes    Weight 6 lb    Reps 10-15      Interval Training   Interval Training No      Treadmill   MPH 3    Grade 3    Minutes 15    METs 4.54      REL-XR   Level 12    Speed 50    Minutes 15    METs 5.4      Home Exercise Plan   Plans to continue exercise at Home (comment)   walking, bike, elliptical, weights   Frequency Add 3 additional days to program exercise sessions.    Initial Home Exercises Provided 05/23/20           Nutrition:  Target Goals: Understanding of nutrition guidelines, daily intake of sodium <1566m, cholesterol <2010m calories 30% from fat and 7% or less from saturated fats, daily to have 5 or more servings of fruits and vegetables.  Education: All About Nutrition: -Group instruction provided by verbal, written material, interactive activities, discussions, models, and posters to present general guidelines for heart healthy nutrition including fat, fiber, MyPlate, the role of sodium in heart healthy nutrition, utilization of the nutrition label, and utilization of this knowledge for meal planning. Follow up email sent as well. Written material given at graduation. [facility]  Date 05/17/20  [facility]- Verbalizes Understanding  [need identified]      Biometrics:  Pre Biometrics - 05/15/20 1314      Pre Biometrics   Height 5' 9.5" (1.765 m)    Weight 221 lb 4.8 oz (100.4 kg)    BMI (Calculated) 32.22    Single Leg Stand 4.72 seconds           Post Biometrics - 10/02/20 0811       Post  Biometrics   Height 5' 9.5" (1.765 m)    Weight 232 lb (105.2 kg)    BMI  (Calculated) 33.78    Single Leg Stand 30 seconds           Nutrition Therapy Plan and Nutrition Goals:  Nutrition Therapy & Goals - 05/29/20 0817      Nutrition Therapy   Diet Heart healthy, Low Na, T2DM    Drug/Food Interactions Statins/Certain Fruits    Protein (specify units) 80g    Fiber 30 grams    Whole Grain Foods 3 servings    Saturated Fats 12 max. grams    Fruits and Vegetables 5 servings/day    Sodium 1.5 grams      Personal Nutrition Goals   Nutrition Goal ST: increase wate intake by 16oz per day  LT: Feel better 7/10 now - working on increasing energy    Comments Pt reports losing appetite and eating about half of his usual intake. B:OJ or low sugar grape juice and two pieces of toast and 2-3 slices of bacon (will not eat eggs). L: vary from meat with two vegetables to a sandwich. D: meat and two vegetables or sandwich or hamburger. S: small amount of ice cream. cooks vegetables steamed, baked, and grilled. Likes to eat salad with dark leafy greens as his salad base. Drinks elderberry juice. Eats a variety vegetables. Eats peanut butter and tries to eat more fruit. Tries to eat whole wheat bread, will sometimes have oatmeal. Pt reports high fat foods turn him off to eating and has  now switched to 2% milk. sugar free mountain dew and unsweetened or half and half tea. Pt reports that blood sugar has been good, only had lows right after surgery - now at low 100s. No longer on glipizide anymore, now just on trulicity and metformin. Pt report having rouble with drinking water, but wants to drink more.      Intervention Plan   Intervention Prescribe, educate and counsel regarding individualized specific dietary modifications aiming towards targeted core components such as weight, hypertension, lipid management, diabetes, heart failure and other comorbidities.;Nutrition handout(s) given to patient.    Expected Outcomes Short Term Goal: Understand basic principles of dietary content,  such as calories, fat, sodium, cholesterol and nutrients.;Short Term Goal: A plan has been developed with personal nutrition goals set during dietitian appointment.;Long Term Goal: Adherence to prescribed nutrition plan.           Nutrition Assessments:  Nutrition Assessments - 05/15/20 1326      MEDFICTS Scores   Pre Score 74          MEDIFICTS Score Key:  ?70 Need to make dietary changes   40-70 Heart Healthy Diet  ? 40 Therapeutic Level Cholesterol Diet   Picture Your Plate Scores:  <93 Unhealthy dietary pattern with much room for improvement.  41-50 Dietary pattern unlikely to meet recommendations for good health and room for improvement.  51-60 More healthful dietary pattern, with some room for improvement.   >60 Healthy dietary pattern, although there may be some specific behaviors that could be improved.    Nutrition Goals Re-Evaluation:  Nutrition Goals Re-Evaluation    Row Name 06/14/20 0726 07/24/20 0833 08/14/20 0755 09/18/20 0737       Goals   Nutrition Goal ST: increase water intake by 16oz per day  LT: Feel better 7/10 now - working on increasing energy ST: increase water intake by 16oz per day  LT: Feel better 7/10 now - working on increasing energy ST: increase water intake by 16oz per day  LT: Feel better 7/10 now - working on increasing energy ST: increase water intake by 16oz per day  LT: Feel better 7/10 now - working on increasing energy    Comment Pt reports ST goal going "so so". He reports some days that he will drink more than others. Will still want to work towards this goal. Start bringing your water with you and to your exercise. No changes in diet aside from increased fruit and vegetable intake. Patient will continue to work on goals provided by RD. Patient does not report any new questions or concerns at this time. Reginald Murphy is trying to increase his water intake.  He is making some changes and sticking with them. Reginald Murphy has been drinking more water  by being more mindful of how much he drinks per day. Overall, he has been limiting fried foods and red meat intake. He does not have any new goals or questions at this time and will continue to follow goals established by RD. He will be graduating in the next couple of weeks.    Expected Outcome ST: increase water intake by 16oz per day  LT: Feel better 7/10 now - working on increasing energy ST: increase water intake by 16oz per day  LT: Feel better 7/10 now - working on increasing energy Short: Continue to work on water  Long: Continue to eat better. Short: Continue goals from RD Long: Maintain heart healthy diet  Nutrition Goals Discharge (Final Nutrition Goals Re-Evaluation):  Nutrition Goals Re-Evaluation - 09/18/20 0737      Goals   Nutrition Goal ST: increase water intake by 16oz per day  LT: Feel better 7/10 now - working on increasing energy    Comment Reginald Murphy has been drinking more water by being more mindful of how much he drinks per day. Overall, he has been limiting fried foods and red meat intake. He does not have any new goals or questions at this time and will continue to follow goals established by RD. He will be graduating in the next couple of weeks.    Expected Outcome Short: Continue goals from RD Long: Maintain heart healthy diet           Psychosocial: Target Goals: Acknowledge presence or absence of significant depression and/or stress, maximize coping skills, provide positive support system. Participant is able to verbalize types and ability to use techniques and skills needed for reducing stress and depression.   Education: Stress, Anxiety, and Depression - Group verbal and visual presentation to define topics covered.  Reviews how body is impacted by stress, anxiety, and depression.  Also discusses healthy ways to reduce stress and to treat/manage anxiety and depression.  Written material given at graduation. Flowsheet Row Cardiac Rehab from 08/16/2020 in [facility]  Cardiac and Pulmonary Rehab  Date 06/14/20  Educator [facility]  Instruction Review Code 1- [employer] Understanding      Education: Sleep Hygiene -Provides group verbal and written instruction about how sleep can affect your health.  Define sleep hygiene, discuss sleep cycles and impact of sleep habits. Review good sleep hygiene tips.    Initial Review & Psychosocial Screening:  Initial Psych Review & Screening - 05/10/20 0939      Initial Review   Current issues with Current Stress Concerns    Source of Stress Concerns Family    Comments mother has dementia (living at [place])      [place]? Yes   wife, cousins that live near by (he's an only child)     Barriers   Psychosocial barriers to participate in program There are no identifiable barriers or psychosocial needs.      Screening Interventions   Interventions Encouraged to exercise;To provide support and resources with identified psychosocial needs;Provide feedback about the scores to participant    Expected Outcomes Short Term goal: Utilizing psychosocial counselor, staff and physician to assist with identification of specific Stressors or current issues interfering with healing process. Setting desired goal for each stressor or current issue identified.;Long Term Goal: Stressors or current issues are controlled or eliminated.;Short Term goal: Identification and review with participant of any Quality of Life or Depression concerns found by scoring the questionnaire.;Long Term goal: The participant improves quality of Life and PHQ9 Scores as seen by post scores and/or verbalization of changes           Quality of Life Scores:   Quality of Life - 05/15/20 1326      Quality of Life   Select Quality of Life      Quality of Life Scores   Health/Function Pre 29.57 %    Socioeconomic Pre 30 %    Psych/Spiritual Pre 30 %    Family Pre 30 %    GLOBAL Pre 29.82 %          Scores of 19 and below  usually indicate a poorer quality of life in these areas.  A difference of  2-3 points is a clinically meaningful difference.  A difference of 2-3 points in the total score of the Quality of Life Index has been associated with significant improvement in overall quality of life, self-image, physical symptoms, and general health in studies assessing change in quality of life.  PHQ-9: Recent Review Flowsheet Data    Depression screen [facility] 2/9 08/07/2020 05/15/2020 07/18/2019 04/01/2017 04/01/2017   Decreased Interest 0 0 0 0 0   Down, Depressed, Hopeless 0 0 0 0 0   PHQ - 2 Score 0 0 0 0 0   Altered sleeping 0 0 - 2 -   Tired, decreased energy 0 0 - 1 -   Change in appetite 0 0 - 0 -   Feeling bad or failure about yourself  0 0 - 0 -   Trouble concentrating 0 0 - 0 -   Moving slowly or fidgety/restless 0 0 - 0 -   Suicidal thoughts 0 0 - 0 -   PHQ-9 Score 0 0 - 3 -   Difficult doing work/chores Not difficult at all Not difficult at all - - -     Interpretation of Total Score  Total Score Depression Severity:  1-4 = Minimal depression, 5-9 = Mild depression, 10-14 = Moderate depression, 15-19 = Moderately severe depression, 20-27 = Severe depression   Psychosocial Evaluation and Intervention:  Psychosocial Evaluation - 05/10/20 0946      Psychosocial Evaluation & Interventions   Interventions Encouraged to exercise with the program and follow exercise prescription    Comments 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  He is a good spirited man. He is an only child but has a great support system with his wife and cousins that live nearby.  His mother is now living in [place] [facility] with dementia, which got worse while he was having to undergo surgery.  Overall, he is doing very well and eager to get going again.  He wants to get back to his normal and be able to walk on the beach again without having to stop to catch his breath.  He sleeps well at night and has no other major stressors  currently.    Expected Outcomes Short: Attend rehab to build stamina  Long: Continue to cope well with recovery    Continue Psychosocial Services  Follow up required by staff           Psychosocial Re-Evaluation:  Psychosocial Re-Evaluation    [place] Name 05/29/20 [id number] 06/14/20 0728 07/24/20 0735 08/14/20 0750 09/18/20 0739     Psychosocial Re-Evaluation   Current issues with _0    Comments Reginald Murphy has no new stress concerns - says he has less stress since he got his taxes done. Reginald Murphy has no new stress concerns - says he has less stress since he got his taxes mostly done and he is finishing them up now. He has planned travel for next year - [place], [place], [place], [place]. He is still having stress with his mom, but she is doing better with her care. Two daughters he has that he has no relationship with because of their mother. Wife is his support system as well as his cousins. He reports fishing  and cycling to help manage stress. 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 Reginald Murphy just got back from [place] and had a really good time with his wife.  He has a plan to go to [place] at the end of January. He continues to stress about his mom who currently lives in a home. He denies any other big stressors at this time. He has good support from his family. He is keeping busy right now decorating for the holidays. Reginald Murphy enjoyed the [employer] at [employer] this past weekend.  He also did the Christmas Parade the week before down at the beach.  His [place] trip will be in February/March.  His mom is still biggest stressor and she is now in memory care at [facility].  He is doing well overall and enjoying the fesitivites of the holidays.  He is not looking forward to tax time this year. Reginald Murphy is doing well mentally. He denies any new stressors at this time  despite taking care of his mother. He is going to [place] at the end of February with his wife and is looking forward to going on vacation.Marland Kitchen He sleep is good. Has a colonoscopy coming up and is a little stressed on what the results may be. He is going to graduate in the next couple of weeks and is very pleased with how his exercise has been helping.   Expected Outcomes Short: continue to exercise to manage stress Long: maintain positive outlook Short: continue to exercise to manage stress Long: maintain positive outlook Short: Continue attendance with [facility] for stress management Long: Maintain overall posititive attitude Short: Continue to enjoy holidays and rest in his mom's care Long: Continue to stay positive Short: Graduate from [place]: Maintain positive attitude and utilize exercise for stress management   Interventions -- -- Encouraged to attend Cardiac Rehabilitation for the exercise Encouraged to attend Cardiac Rehabilitation for the exercise Encouraged to attend Cardiac Rehabilitation for the exercise   Continue Psychosocial Services  -- -- Follow up required by staff Follow up required by staff Follow up required by staff   Comments -- mother has dementia (living at [place]) -- -- Reginald Murphy has enjoyed [place]. He thinks rehab has helped to hold himself accountable in keeping up with exercise. He realized how important it is to continue to monitor his vitals. He feels better when he leaves rehab and overall has enjoyed the program.     Initial Review   Source of Stress Concerns -- None Identified;Family -- -- --          Psychosocial Discharge (Final Psychosocial Re-Evaluation):  Psychosocial Re-Evaluation - 09/18/20 0739      Psychosocial Re-Evaluation   Current issues with Current Stress Concerns    Comments Reginald Murphy is doing well mentally. He denies any new stressors at this time despite taking care of his mother. He is going to [place] at the end of February with his wife  and is looking forward to going on vacation.Marland Kitchen He sleep is good. Has a colonoscopy coming up and is a little stressed on what the results may be. He is going to graduate in the next couple of weeks and is very pleased with how his exercise has been helping.    Expected Outcomes Short: Graduate from [place]: Maintain positive attitude and utilize exercise for stress management    Interventions Encouraged to attend Cardiac Rehabilitation for the exercise    Continue Psychosocial Services  Follow up required by staff    Comments Reginald Murphy has enjoyed [facility]. He thinks rehab has helped to hold himself accountable in keeping up with exercise. He realized how important it is to continue to monitor  his vitals. He feels better when he leaves rehab and overall has enjoyed the program.           Vocational Rehabilitation: Provide vocational rehab assistance to qualifying candidates.   Vocational Rehab Evaluation & Intervention:  Vocational Rehab - 05/10/20 9407      Initial Vocational Rehab Evaluation & Intervention   Assessment shows need for Vocational Rehabilitation No   retired          Education: Education Goals: Education classes will be provided on a variety of topics geared toward better understanding of heart health and risk factor modification. Participant will state understanding/return demonstration of topics presented as noted by education test scores.  Learning Barriers/Preferences:  Learning Barriers/Preferences - 05/10/20 6808      Learning Barriers/Preferences   Learning Barriers Sight   glasses for reading   Learning Preferences None           General Cardiac Education Topics:  AED/CPR: - Group verbal and written instruction with the use of models to demonstrate the basic use of the AED with the basic ABC's of resuscitation.   Anatomy and Cardiac Procedures: - Group verbal and visual presentation and models provide information about basic cardiac anatomy and  function. Reviews the testing methods done to diagnose heart disease and the outcomes of the test results. Describes the treatment choices: Medical Management, Angioplasty, or Coronary Bypass Surgery for treating various heart conditions including Myocardial Infarction, Angina, Valve Disease, and Cardiac Arrhythmias.  Written material given at graduation. Flowsheet Row Cardiac Rehab from 05/23/2020 in [facility] Cardiac and Pulmonary Rehab  Date 05/15/20  Instruction Review Code 3- Needs Reinforcement  [need identified]      Medication Safety: - Group verbal and visual instruction to review commonly prescribed medications for heart and lung disease. Reviews the medication, class of the drug, and side effects. Includes the steps to properly store meds and maintain the prescription regimen.  Written material given at graduation. Flowsheet Row Cardiac Rehab from 05/23/2020 in [facility] Cardiac and Pulmonary Rehab  Date 05/23/20  Educator SB  Instruction Review Code 1- Verbalizes Understanding      Intimacy: - Group verbal instruction through game format to discuss how heart and lung disease can affect sexual intimacy. Written material given at graduation..   Know Your Numbers and Heart Failure: - Group verbal and visual instruction to discuss disease risk factors for cardiac and pulmonary disease and treatment options.  Reviews associated critical values for Overweight/Obesity, Hypertension, Cholesterol, and Diabetes.  Discusses basics of heart failure: signs/symptoms and treatments.  Introduces Heart Failure Zone chart for action plan for heart failure.  Written material given at graduation. Flowsheet Row Cardiac Rehab from 08/16/2020 in [facility] Cardiac and Pulmonary Rehab  Date 07/31/20  Educator SB  Instruction Review Code 1- Verbalizes Understanding      Infection Prevention: - Provides verbal and written material to individual with discussion of infection control including proper hand washing and  proper equipment cleaning during exercise session. Flowsheet Row Cardiac Rehab from 05/23/2020 in [facility] Cardiac and Pulmonary Rehab  Date 05/15/20  Educator [facility]  Instruction Review Code 1- Verbalizes Understanding      Falls Prevention: - Provides verbal and written material to individual with discussion of falls prevention and safety. Flowsheet Row Cardiac Rehab from 05/23/2020 in [facility] Cardiac and Pulmonary Rehab  Date 05/15/20  Educator [facility]  Instruction Review Code 1- Verbalizes Understanding      Other: -Provides group and verbal instruction on various topics (see comments)  Knowledge Questionnaire Score:  Knowledge Questionnaire Score - 05/15/20 1326      Knowledge Questionnaire Score   Pre Score 24/28 Education Focus: Angina, Nutrtition, Exercise           Core Components/Risk Factors/Patient Goals at Admission:  Personal Goals and Risk Factors at Admission - 05/15/20 1329      Core Components/Risk Factors/Patient Goals on Admission    Weight Management Yes;Obesity;Weight Loss    Intervention Weight Management: Develop a combined nutrition and exercise program designed to reach desired caloric intake, while maintaining appropriate intake of nutrient and fiber, sodium and fats, and appropriate energy expenditure required for the weight goal.;Weight Management: Provide education and appropriate resources to help participant work on and attain dietary goals.;Weight Management/Obesity: Establish reasonable short term and long term weight goals.;Obesity: Provide education and appropriate resources to help participant work on and attain dietary goals.    Admit Weight 221 lb 4.8 oz (100.4 kg)    Goal Weight: Short Term 215 lb (97.5 kg)    Goal Weight: Long Term 200 lb (90.7 kg)    Expected Outcomes Short Term: Continue to assess and modify interventions until short term weight is achieved;Long Term: Adherence to nutrition and physical activity/exercise program aimed toward  attainment of established weight goal;Weight Loss: Understanding of general recommendations for a balanced deficit meal plan, which promotes 1-2 lb weight loss per week and includes a negative energy balance of [phone] kcal/d;Understanding recommendations for meals to include 15-35% energy as protein, 25-35% energy from fat, 35-60% energy from carbohydrates, less than 216m of dietary cholesterol, 20-35 gm of total fiber daily;Understanding of distribution of calorie intake throughout the day with the consumption of 4-5 meals/snacks    Diabetes Yes    Intervention Provide education about signs/symptoms and action to take for hypo/hyperglycemia.;Provide education about proper nutrition, including hydration, and aerobic/resistive exercise prescription along with prescribed medications to achieve blood glucose in normal ranges: Fasting glucose 65-99 mg/dL    Expected Outcomes Short Term: Participant verbalizes understanding of the signs/symptoms and immediate care of hyper/hypoglycemia, proper foot care and importance of medication, aerobic/resistive exercise and nutrition plan for blood glucose control.;Long Term: Attainment of HbA1C < 7%.    Hypertension Yes    Intervention Provide education on lifestyle modifcations including regular physical activity/exercise, weight management, moderate sodium restriction and increased consumption of fresh fruit, vegetables, and low fat dairy, alcohol moderation, and smoking cessation.;Monitor prescription use compliance.    Expected Outcomes Short Term: Continued assessment and intervention until BP is < 140/97mHG in hypertensive participants. < 130/8031mG in hypertensive participants with diabetes, heart failure or chronic kidney disease.;Long Term: Maintenance of blood pressure at goal levels.    Lipids Yes    Intervention Provide education and support for participant on nutrition & aerobic/resistive exercise along with prescribed medications to achieve LDL <56m52mHDL >40mg43m Expected Outcomes Short Term: Participant states understanding of desired cholesterol values and is compliant with medications prescribed. Participant is following exercise prescription and nutrition guidelines.;Long Term: Cholesterol controlled with medications as prescribed, with individualized exercise RX and with personalized nutrition plan. Value goals: LDL < 56mg,78m > 40 mg.           Education:Diabetes - Individual verbal and written instruction to review signs/symptoms of diabetes, desired ranges of glucose level fasting, after meals and with exercise. Acknowledge that pre and post exercise glucose checks will be done for 3 sessions at entry of program. Flowsheet Row Cardiac Rehab from 05/23/2020 in  [facility]  Date 05/15/20  Educator [facility]  Instruction Review Code 1- Verbalizes Understanding      Core Components/Risk Factors/Patient Goals Review:   Goals and Risk Factor Review    Row Name 05/29/20 [phone] 05/31/20 0728 06/14/20 0733 07/24/20 0728 08/14/20 0758     Core Components/Risk Factors/Patient Goals Review   Personal Goals Review Weight Management/Obesity;Hypertension;Diabetes;Lipids Weight Management/Obesity;Hypertension;Diabetes;Lipids Weight Management/Obesity;Hypertension;Diabetes;Lipids Weight Management/Obesity;Hypertension;Diabetes Weight Management/Obesity;Hypertension;Diabetes   Review Reginald Murphy checks his BG at home on days not at [facility].  Normally it runs around 110.  He reports taking meds as directed. -- Reginald Murphy checks his BG at home on days not at [facility]. He reports his average is about 101 in am. He reports taking meds as directed. Weight is staying stable, Reginald Murphy is trying to maintain his weight and is happy where he is. He lost weight after his surgery and has stayed about the same since. He is checking his sugars everyday and have been staying around 110-120. He does recognize symptoms if he is too low. He does not check his BP at home as he  does not have a cuff. BPs have been ranging 465-681E systolic here at [facility]. He is stayin compliant with all his medications. Reginald Murphy is doing well in rehab. His weight has continued to be stable.  His pressures have been up a little bit but staying within range other than after this weekend of drinking.  His sugars have continued to do well with all of his metformin.   Expected Outcomes Short: attend HT consistently Long: manage risk factors on his own -- Short: attend HT consistently Long: manage risk factors on his own Short: Continue checking blood sugars and weights regularly Long: Manage lifestyle risk factors Short: Continue to monitor sugars closely Long: Continue to monitor risk factors.   [place] Name 09/18/20 0750             Core Components/Risk Factors/Patient Goals Review   Personal Goals Review Weight Management/Obesity;Hypertension;Diabetes       Review Reginald Murphy is doing well. He is still checking sugars at home, ranges around 110 in the morning. BPs have been stable here at rehab, he is thinking about purchasing his own cuff to use at home now that he is getting close to graduation . Weight has been the same around 228 lb,  right now his goal is to maintain. He weighs himself at home weekly.       Expected Outcomes Short: Continue to monitor vitals at home Long: Graduate from [facility] and continue to manage risk factors              Core Components/Risk Factors/Patient Goals at Discharge (Final Review):   Goals and Risk Factor Review - 09/18/20 0750      Core Components/Risk Factors/Patient Goals Review   Personal Goals Review Weight Management/Obesity;Hypertension;Diabetes    Review Reginald Murphy is doing well. He is still checking sugars at home, ranges around 110 in the morning. BPs have been stable here at rehab, he is thinking about purchasing his own cuff to use at home now that he is getting close to graduation . Weight has been the same around 228 lb,  right now his goal is to  maintain. He weighs himself at home weekly.    Expected Outcomes Short: Continue to monitor vitals at home Long: Graduate from [facility] and continue to manage risk factors           ITP Comments:  ITP Comments    Row  Name 05/10/20 0949 05/15/20 1304 05/16/20 1624 05/17/20 0824 06/13/20 0613   ITP Comments Completed virtual orientation today.  EP evaluation is scheduled for Tues 9/7 at 9am.  Documentation for diagnosis can be found in [facility] encounter 04/04/20. Completed 6MWT and gym orientation. Initial ITP created and sent for review to Dr. Emily Filbert, Medical Director. 30 day review completed. ITP sent to Dr. Emily Filbert, Medical Director of Cardiac and Pulmonary Rehab. Continue with ITP unless changes are made by physician. First full day of exercise!  Patient was oriented to gym and equipment including functions, settings, policies, and procedures.  Patient's individual exercise prescription and treatment plan were reviewed.  All starting workloads were established based on the results of the 6 minute walk test done at initial orientation visit.  The plan for exercise progression was also introduced and progression will be customized based on patient's performance and goals. 30 Day review completed. Medical Director ITP review done, changes made as directed, and signed approval by Medical Director.   [place] Name 07/11/20 0713 08/08/20 1040 09/05/20 0627 09/17/20 1135 10/03/20 0941   ITP Comments 30 Day review completed. Medical Director ITP review done, changes made as directed, and signed approval by Medical Director. 30 Day review completed. Medical Director ITP review done, changes made as directed, and signed approval by Medical Director. 30 Day review completed. Medical Director ITP review done, changes made as directed, and signed approval by Medical Director. Been out with family in town 42 Day review completed. Medical Director ITP review done, changes made as directed, and signed approval by  Medical Director.          Comments:

## 2020-10-04 ENCOUNTER — Other Ambulatory Visit: Payer: Self-pay | Admitting: Family Medicine

## 2020-10-04 DIAGNOSIS — E1169 Type 2 diabetes mellitus with other specified complication: Secondary | ICD-10-CM

## 2020-10-04 DIAGNOSIS — E669 Obesity, unspecified: Secondary | ICD-10-CM

## 2020-10-04 DIAGNOSIS — I10 Essential (primary) hypertension: Secondary | ICD-10-CM

## 2020-10-04 NOTE — Telephone Encounter (Signed)
Requested Prescriptions  Pending Prescriptions Disp Refills  . lisinopril (ZESTRIL) 40 MG tablet [Pharmacy Med Name: LISINOPRIL 40 MG TABLET] 90 tablet 0    Sig: TAKE 1 TABLET (40 MG TOTAL) BY MOUTH DAILY.     Cardiovascular:  ACE Inhibitors Passed - 10/04/2020  3:15 AM      Passed - Cr in normal range and within 180 days    Creatinine, Ser  Date Value Ref Range Status  08/07/2020 1.03 0.76 - 1.27 mg/dL Final         Passed - K in normal range and within 180 days    Potassium  Date Value Ref Range Status  08/07/2020 4.6 3.5 - 5.2 mmol/L Final         Passed - Patient is not pregnant      Passed - Last BP in normal range    BP Readings from Last 1 Encounters:  09/20/20 133/84         Passed - Valid encounter within last 6 months    Recent Outpatient Visits          1 month ago Annual physical exam   [facility] Jerrol Banana., MD   5 months ago Diabetes mellitus without complication [facility])   [facility] Jerrol Banana., MD   10 months ago Diabetes mellitus without complication [facility])   [facility] Jerrol Banana., MD   1 year ago Diabetes mellitus without complication [facility])   [facility] Jerrol Banana., MD   1 year ago Diabetes mellitus without complication [facility])   [facility] Jerrol Banana., MD      Future Appointments            In 2 months Jerrol Banana., MD [facility]

## 2020-10-09 ENCOUNTER — Encounter: Payer: 59 | Attending: Cardiology | Admitting: *Deleted

## 2020-10-09 ENCOUNTER — Other Ambulatory Visit: Payer: Self-pay

## 2020-10-09 DIAGNOSIS — Z951 Presence of aortocoronary bypass graft: Secondary | ICD-10-CM | POA: Insufficient documentation

## 2020-10-09 NOTE — Progress Notes (Signed)
Daily Session Note  Patient Details  Name: Reginald Murphy MRN: [id number] Date of Birth: 1956/05/26 Referring Provider:   Flowsheet Row Cardiac Rehab from 05/15/2020 in [facility]  Referring Provider Bartholome Bill MD      Encounter Date: 10/09/2020  Check In:  Session Check In - 10/09/20 0810      Check-In   Supervising physician immediately available to respond to emergencies See telemetry face sheet for immediately available ER MD    Location [facility]-Cardiac & Pulmonary Rehab    Staff Present Heath Lark, RN, BSN, Dorris Singh, MPA, Nino Glow, MS Exercise Physiologist    Virtual Visit No    Medication changes reported     No    Fall or balance concerns reported    No    Warm-up and Cool-down Performed on first and last piece of equipment    Resistance Training Performed Yes    VAD Patient? No    PAD/SET Patient? No      Pain Assessment   Currently in Pain? No/denies              Social History   Tobacco Use  Smoking Status Never Smoker  Smokeless Tobacco Never Used    Goals Met:  Independence with exercise equipment Exercise tolerated well No report of cardiac concerns or symptoms  Goals Unmet:  Not Applicable  Comments: Pt able to follow exercise prescription today without complaint.  Will continue to monitor for progression.    Dr. Emily Filbert is Medical Director for [place] and [facility].

## 2020-10-16 ENCOUNTER — Other Ambulatory Visit: Payer: Self-pay

## 2020-10-16 ENCOUNTER — Encounter: Payer: 59 | Admitting: *Deleted

## 2020-10-16 DIAGNOSIS — Z951 Presence of aortocoronary bypass graft: Secondary | ICD-10-CM | POA: Diagnosis not present

## 2020-10-16 NOTE — Progress Notes (Signed)
Daily Session Note  Patient Details  Name: DAVIT VASSAR MRN: [id number] Date of Birth: 02-Dec-1955 Referring Provider:   Flowsheet Row Cardiac Rehab from 05/15/2020 in [facility]  Referring Provider Bartholome Bill MD      Encounter Date: 10/16/2020  Check In:  Session Check In - 10/16/20 0806      Check-In   Supervising physician immediately available to respond to emergencies See telemetry face sheet for immediately available ER MD    Location [facility] Pul[facility] Rehab    Staff Present Heath Lark, RN, BSN, CCRP;Amanda Sommer, BA, ACSM CEP, Exercise Physiologist;Kara Eliezer Bottom, MS Exercise Physiologist    Virtual Visit No    Medication changes reported     No    Fall or balance concerns reported    No    Warm-up and Cool-down Performed on first and last piece of equipment    Resistance Training Performed Yes    VAD Patient? No    PAD/SET Patient? No      Pain Assessment   Currently in Pain? No/denies              Social History   Tobacco Use  Smoking Status Never Smoker  Smokeless Tobacco Never Used    Goals Met:  Independence with exercise equipment Exercise tolerated well No report of cardiac concerns or symptoms  Goals Unmet:  Not Applicable  Comments: Pt able to follow exercise prescription today without complaint.  Will continue to monitor for progression.    Dr. Emily Filbert is Medical Director for [place] and [facility].

## 2020-10-18 ENCOUNTER — Other Ambulatory Visit: Payer: Self-pay

## 2020-10-18 DIAGNOSIS — Z951 Presence of aortocoronary bypass graft: Secondary | ICD-10-CM

## 2020-10-18 NOTE — Progress Notes (Signed)
Daily Session Note  Patient Details  Name: KHARSON RASMUSSON MRN: [id number] Date of Birth: 1956/02/20 Referring Provider:   Flowsheet Row Cardiac Rehab from 05/15/2020 in [facility]  Referring Provider Bartholome Bill MD      Encounter Date: 10/18/2020  Check In:  Session Check In - 10/18/20 0726      Check-In   Supervising physician immediately available to respond to emergencies See telemetry face sheet for immediately available ER MD    Location [facility]    Staff Present Birdie Sons, MPA, RN;Melissa Caiola RDN, Rowe Pavy, BA, ACSM CEP, Exercise Physiologist    Virtual Visit No    Medication changes reported     No    Fall or balance concerns reported    No    Warm-up and Cool-down Performed on first and last piece of equipment    Resistance Training Performed Yes    VAD Patient? No    PAD/SET Patient? No      Pain Assessment   Currently in Pain? No/denies              Social History   Tobacco Use  Smoking Status Never Smoker  Smokeless Tobacco Never Used    Goals Met:  Independence with exercise equipment Exercise tolerated well No report of cardiac concerns or symptoms Strength training completed today  Goals Unmet:  Not Applicable  Comments: Pt able to follow exercise prescription today without complaint.  Will continue to monitor for progression.    Dr. Emily Filbert is Medical Director for [place] and [facility].

## 2020-10-25 ENCOUNTER — Encounter: Payer: 59 | Admitting: Licensed Clinical Social Worker

## 2020-10-25 ENCOUNTER — Other Ambulatory Visit: Payer: Self-pay

## 2020-10-25 ENCOUNTER — Other Ambulatory Visit: Payer: 59

## 2020-10-25 DIAGNOSIS — Z951 Presence of aortocoronary bypass graft: Secondary | ICD-10-CM | POA: Diagnosis not present

## 2020-10-25 NOTE — Progress Notes (Signed)
Daily Session Note  Patient Details  Name: Reginald Murphy MRN: [id number] Date of Birth: [birth year] Referring Provider:   Flowsheet Row Cardiac Rehab from 05/15/2020 in [facility] Cardiac [facility] Pulmonary Rehab  Referring Provider Bartholome Bill MD      Encounter Date: 10/25/2020  Check In:  Session Check In - 10/25/20 0717      Check-In   Supervising physician immediately available to respond to emergencies See telemetry face sheet for immediately available ER MD    Location [facility]    Staff Present Birdie Sons, MPA, RN;Melissa Caiola RDN, Rowe Pavy, BA, ACSM CEP, Exercise Physiologist    Virtual Visit No    Medication changes reported     No    Fall or balance concerns reported    No    Warm-up and Cool-down Performed on first and last piece of equipment    Resistance Training Performed Yes    VAD Patient? No    PAD/SET Patient? No      Pain Assessment   Currently in Pain? No/denies              Social History   Tobacco Use  Smoking Status Never Smoker  Smokeless Tobacco Never Used    Goals Met:  Independence with exercise equipment Exercise tolerated well No report of cardiac concerns or symptoms Strength training completed today  Goals Unmet:  Not Applicable  Comments:  Reginald Murphy graduated today from  rehab with 35 sessions completed.  Details of the patient's exercise prescription and what He needs to do in order to continue the prescription and progress were discussed with patient.  Patient was given a copy of prescription and goals.  Patient verbalized understanding.  Reginald Murphy plans to continue to exercise by walking at home.    Dr. Emily Filbert is Medical Director for [place] and [facility].

## 2020-10-25 NOTE — Patient Instructions (Signed)
Discharge Patient Instructions  Patient Details  Name: Reginald Murphy MRN: [id number] Date of Birth: 1955-12-22 Referring Provider:  Teodoro Spray, MD   Number of Visits: 68  Reason for Discharge:  Patient reached a stable level of exercise. Patient independent in their exercise. Patient has met program and personal goals.  Smoking History:  Social History   Tobacco Use  Smoking Status Never Smoker  Smokeless Tobacco Never Used    Diagnosis:  No diagnosis found.  Initial Exercise Prescription:  Initial Exercise Prescription - 05/15/20 1300      Date of Initial Exercise RX and Referring Provider   Date 05/15/20    Referring Provider Bartholome Bill MD      Treadmill   MPH 2.3    Grade 1    Minutes 15    METs 3.08      Elliptical   Level 1    Speed 2.7    Minutes 15    METs 3      REL-XR   Level 3    Speed 50    Minutes 15    METs 3      Prescription Details   Frequency (times per week) 2    Duration Progress to 30 minutes of continuous aerobic without signs/symptoms of physical distress      Intensity   THRR 40-80% of Max Heartrate 115-144    Ratings of Perceived Exertion 11-13    Perceived Dyspnea 0-4      Progression   Progression Continue to progress workloads to maintain intensity without signs/symptoms of physical distress.      Resistance Training   Training Prescription Yes    Weight 3 lb    Reps 10-15           Discharge Exercise Prescription (Final Exercise Prescription Changes):  Exercise Prescription Changes - 10/15/20 0900      Response to Exercise   Blood Pressure (Admit) 134/70    Blood Pressure (Exercise) 132/64    Blood Pressure (Exit) 126/68    Heart Rate (Admit) 81 bpm    Heart Rate (Exercise) 108 bpm    Heart Rate (Exit) 90 bpm    Rating of Perceived Exertion (Exercise) 12    Symptoms none    Duration Continue with 30 min of aerobic exercise without signs/symptoms of physical distress.    Intensity THRR unchanged       Progression   Progression Continue to progress workloads to maintain intensity without signs/symptoms of physical distress.    Average METs 6.07      Resistance Training   Training Prescription Yes    Weight 6 lb    Reps 10-15      Interval Training   Interval Training No      Treadmill   MPH 3    Grade 3    Minutes 15    METs 4.54      REL-XR   Level 12    Minutes 15    METs 7.6      Home Exercise Plan   Plans to continue exercise at Home (comment)   walking, bike, elliptical, weights   Frequency Add 3 additional days to program exercise sessions.    Initial Home Exercises Provided 05/23/20           Functional Capacity:  6 Minute Walk    Row Name 05/15/20 1307 10/02/20 0810       6 Minute Walk   Phase Initial Discharge    Distance  1235 feet 1625 feet    Distance % Change -- 31.5 %    Distance Feet Change -- 390 ft    Walk Time 6 minutes 6 minutes    # of Rest Breaks 0 0    MPH 2.34 3.08    METS 3.1 3.7    RPE 8 11    VO2 Peak 10.84 12.94    Symptoms No No    Resting HR 86 bpm 78 bpm    Resting BP 124/62 134/70    Resting Oxygen Saturation  98 % --    Exercise Oxygen Saturation  during 6 min walk 99 % --    Max Ex. HR 114 bpm 98 bpm    Max Ex. BP 130/64 154/66    2 Minute Post BP 134/70 --             Nutrition & Weight - Outcomes:  Pre Biometrics - 05/15/20 1314      Pre Biometrics   Height 5' 9.5" (1.765 m)    Weight 221 lb 4.8 oz (100.4 kg)    BMI (Calculated) 32.22    Single Leg Stand 4.72 seconds           Post Biometrics - 10/02/20 0811       Post  Biometrics   Height 5' 9.5" (1.765 m)    Weight 232 lb (105.2 kg)    BMI (Calculated) 33.78    Single Leg Stand 30 seconds           Nutrition:  Nutrition Therapy & Goals - 05/29/20 0817      Nutrition Therapy   Diet Heart healthy, Low Na, T2DM    Drug/Food Interactions Statins/Certain Fruits    Protein (specify units) 80g    Fiber 30 grams    Whole Grain Foods 3  servings    Saturated Fats 12 max. grams    Fruits and Vegetables 5 servings/day    Sodium 1.5 grams      Personal Nutrition Goals   Nutrition Goal ST: increase wate intake by 16oz per day  LT: Feel better 7/10 now - working on increasing energy    Comments Pt reports losing appetite and eating about half of his usual intake. B:OJ or low sugar grape juice and two pieces of toast and 2-3 slices of bacon (will not eat eggs). L: vary from meat with two vegetables to a sandwich. D: meat and two vegetables or sandwich or hamburger. S: small amount of ice cream. cooks vegetables steamed, baked, and grilled. Likes to eat salad with dark leafy greens as his salad base. Drinks elderberry juice. Eats a variety vegetables. Eats peanut butter and tries to eat more fruit. Tries to eat whole wheat bread, will sometimes have oatmeal. Pt reports high fat foods turn him off to eating and has now switched to 2% milk. sugar free mountain dew and unsweetened or half and half tea. Pt reports that blood sugar has been good, only had lows right after surgery - now at low 100s. No longer on glipizide anymore, now just on trulicity and metformin. Pt report having rouble with drinking water, but wants to drink more.      Intervention Plan   Intervention Prescribe, educate and counsel regarding individualized specific dietary modifications aiming towards targeted core components such as weight, hypertension, lipid management, diabetes, heart failure and other comorbidities.;Nutrition handout(s) given to patient.    Expected Outcomes Short Term Goal: Understand basic principles of dietary content, such as calories,  fat, sodium, cholesterol and nutrients.;Short Term Goal: A plan has been developed with personal nutrition goals set during dietitian appointment.;Long Term Goal: Adherence to prescribed nutrition plan.          Goals reviewed with patient; copy given to patient.

## 2020-10-30 ENCOUNTER — Encounter: Payer: Self-pay | Admitting: *Deleted

## 2020-10-30 DIAGNOSIS — Z951 Presence of aortocoronary bypass graft: Secondary | ICD-10-CM

## 2020-10-30 NOTE — Progress Notes (Signed)
Cardiac Individual Treatment Plan  Patient Details  Name: Reginald Murphy MRN: [id number] Date of Birth: 15-Apr-1956 Referring Provider:   Flowsheet Row Cardiac Rehab from 05/15/2020 in [facility] Cardiac and Pulmonary Rehab  Referring Provider Bartholome Bill MD      Initial Encounter Date:  Flowsheet Row Cardiac Rehab from 05/15/2020 in [facility] Cardiac and Pulmonary Rehab  Date 05/15/20      Visit Diagnosis: S/P CABG x 3  Patient's Home Medications on Admission:  Current Outpatient Medications:  .  ACCU-CHEK GUIDE test strip, Please specify directions, refills and quantity, Disp: 100 each, Rfl: 11 .  albuterol (VENTOLIN HFA) 108 (90 Base) MCG/ACT inhaler, TAKE 2 PUFFS BY MOUTH EVERY 6 HOURS AS NEEDED FOR WHEEZE OR SHORTNESS OF BREATH (Patient not taking: No sig reported), Disp: 18 g, Rfl: 2 .  amLODipine (NORVASC) 5 MG tablet, TAKE 1 TABLET BY MOUTH EVERY DAY (Patient not taking: Reported on 09/20/2020), Disp: 90 tablet, Rfl: 0 .  aspirin EC 81 MG tablet, Take 81 mg by mouth daily. , Disp: , Rfl:  .  atorvastatin (LIPITOR) 40 MG tablet, Take 40 mg by mouth at bedtime. (Patient not taking: Reported on 09/20/2020), Disp: , Rfl:  .  Blood Glucose Monitoring Suppl (ACCU-CHEK NANO SMARTVIEW) w/Device KIT, 1 strip by Does not apply route daily., Disp: 1 kit, Rfl: 0 .  glipiZIDE (GLUCOTROL XL) 10 MG 24 hr tablet, Take by mouth. (Patient not taking: No sig reported), Disp: , Rfl:  .  glucose blood test strip, 1 each by Other route as needed for other. Use as instructed, Disp: , Rfl:  .  lisinopril (ZESTRIL) 40 MG tablet, TAKE 1 TABLET (40 MG TOTAL) BY MOUTH DAILY., Disp: 90 tablet, Rfl: 0 .  metFORMIN (GLUCOPHAGE) 1000 MG tablet, TAKE 1 TABLET BY MOUTH 2 TIMES DAILY WITH A MEAL., Disp: 180 tablet, Rfl: 0 .  metoprolol tartrate (LOPRESSOR) 25 MG tablet, Take by mouth., Disp: , Rfl:  .  nitroGLYCERIN (NITROSTAT) 0.4 MG SL tablet, SMARTSIG:1 Tablet(s) Sublingual PRN (Patient not taking: Reported on 09/20/2020),  Disp: , Rfl:  .  ONETOUCH DELICA LANCETS 87F MISC, 2 (two) times daily. for testing, Disp: , Rfl: 12 .  SYMBICORT 80-4.5 MCG/ACT inhaler, TAKE 2 PUFFS BY MOUTH TWICE A DAY (Patient not taking: No sig reported), Disp: 30.6 Inhaler, Rfl: 1 .  tadalafil (CIALIS) 20 MG tablet, Take 1 tablet (20 mg total) by mouth every three (3) days as needed for erectile dysfunction., Disp: 30 tablet, Rfl: 5 .  TRULICITY 6.43 PI/9.5JO SOPN, SMARTSIG:0.5 Milliliter(s) SUB-Q Once a Week (Patient not taking: Reported on 09/20/2020), Disp: 12 mL, Rfl: 3  Past Medical History: Past Medical History:  Diagnosis Date  . Diabetes mellitus without complication ([place])   . Hypertension     Tobacco Use: Social History   Tobacco Use  Smoking Status Never Smoker  Smokeless Tobacco Never Used    Labs: Recent Review Flowsheet Data    Labs for ITP Cardiac and Pulmonary Rehab Latest Ref Rng & Units 03/03/2019 07/18/2019 11/14/2019 05/23/2020 08/07/2020   Cholestrol 100 - 199 mg/dL 176 - - - 102   LDLCALC 0 - 99 mg/dL 80 - - - 40   HDL >39 mg/dL 30(L) - - - 31(L)   Trlycerides 0 - 149 mg/dL 329(H) - - - 188(H)   Hemoglobin A1c 4.8 - 5.6 % 7.0(A) 7.0(A) 7.3(A) 5.9(A) 6.2(H)       Exercise Target Goals: Exercise Program Goal: Individual exercise prescription set using results  from initial 6 min walk test and THRR while considering  patient's activity barriers and safety.   Exercise Prescription Goal: Initial exercise prescription builds to 30-45 minutes a day of aerobic activity, 2-3 days per week.  Home exercise guidelines will be given to patient during program as part of exercise prescription that the participant will acknowledge.   Education: Aerobic Exercise: - Group verbal and visual presentation on the components of exercise prescription. Introduces F.I.T.T principle from ACSM for exercise prescriptions.  Reviews F.I.T.T. principles of aerobic exercise including progression. Written material given at  graduation.   Education: Resistance Exercise: - Group verbal and visual presentation on the components of exercise prescription. Introduces F.I.T.T principle from ACSM for exercise prescriptions  Reviews F.I.T.T. principles of resistance exercise including progression. Written material given at graduation.    Education: Exercise & Equipment Safety: - Individual verbal instruction and demonstration of equipment use and safety with use of the equipment. Flowsheet Row Cardiac Rehab from 05/23/2020 in [facility] Cardiac and Pulmonary Rehab  Date 05/15/20  Educator [facility]  Instruction Review Code 1- Verbalizes Understanding      Education: Exercise Physiology & General Exercise Guidelines: - Group verbal and written instruction with models to review the exercise physiology of the cardiovascular system and associated critical values. Provides general exercise guidelines with specific guidelines to those with heart or lung disease.  Flowsheet Row Cardiac Rehab from 08/16/2020 in [facility] Cardiac and Pulmonary Rehab  Date 06/21/20  Educator [facility]  Instruction Review Code 1- Verbalizes Understanding      Education: Flexibility, Balance, Mind/Body Relaxation: - Group verbal and visual presentation with interactive activity on the components of exercise prescription. Introduces F.I.T.T principle from ACSM for exercise prescriptions. Reviews F.I.T.T. principles of flexibility and balance exercise training including progression. Also discusses the mind body connection.  Reviews various relaxation techniques to help reduce and manage stress (i.e. Deep breathing, progressive muscle relaxation, and visualization). Balance handout provided to take home. Written material given at graduation. Flowsheet Row Cardiac Rehab from 08/16/2020 in [facility] Cardiac and Pulmonary Rehab  Date 07/12/20  Educator [facility]  Instruction Review Code 1- Verbalizes Understanding      Activity Barriers & Risk Stratification:  Activity Barriers &  Cardiac Risk Stratification - 05/15/20 1310      Activity Barriers & Cardiac Risk Stratification   Activity Barriers Deconditioning;Muscular Weakness;Balance Concerns    Cardiac Risk Stratification High           6 Minute Walk:  6 Minute Walk    Row Name 05/15/20 1307 10/02/20 0810       6 Minute Walk   Phase Initial Discharge    Distance 1235 feet 1625 feet    Distance % Change - 31.5 %    Distance Feet Change - 390 ft    Walk Time 6 minutes 6 minutes    # of Rest Breaks 0 0    MPH 2.34 3.08    METS 3.1 3.7    RPE 8 11    VO2 Peak 10.84 12.94    Symptoms No No    Resting HR 86 bpm 78 bpm    Resting BP 124/62 134/70    Resting Oxygen Saturation  98 % -    Exercise Oxygen Saturation  during 6 min walk 99 % -    Max Ex. HR 114 bpm 98 bpm    Max Ex. BP 130/64 154/66    2 Minute Post BP 134/70 -  Oxygen Initial Assessment:   Oxygen Re-Evaluation:   Oxygen Discharge (Final Oxygen Re-Evaluation):   Initial Exercise Prescription:  Initial Exercise Prescription - 05/15/20 1300      Date of Initial Exercise RX and Referring Provider   Date 05/15/20    Referring Provider Bartholome Bill MD      Treadmill   MPH 2.3    Grade 1    Minutes 15    METs 3.08      Elliptical   Level 1    Speed 2.7    Minutes 15    METs 3      REL-XR   Level 3    Speed 50    Minutes 15    METs 3      Prescription Details   Frequency (times per week) 2    Duration Progress to 30 minutes of continuous aerobic without signs/symptoms of physical distress      Intensity   THRR 40-80% of Max Heartrate 115-144    Ratings of Perceived Exertion 11-13    Perceived Dyspnea 0-4      Progression   Progression Continue to progress workloads to maintain intensity without signs/symptoms of physical distress.      Resistance Training   Training Prescription Yes    Weight 3 lb    Reps 10-15           Perform Capillary Blood Glucose checks as needed.  Exercise  Prescription Changes:  Exercise Prescription Changes    Row Name 05/15/20 1300 05/23/20 0700 05/30/20 0700 06/11/20 1700 06/26/20 0800     Response to Exercise   Blood Pressure (Admit) 124/62 - 128/70 132/72 134/72   Blood Pressure (Exercise) 130/64 - 114/54 140/68 134/70   Blood Pressure (Exit) 134/70 - 118/72 130/70 118/64   Heart Rate (Admit) 86 bpm - 82 bpm 84 bpm 76 bpm   Heart Rate (Exercise) 114 bpm - 107 bpm 116 bpm 92 bpm   Heart Rate (Exit) 87 bpm - 91 bpm 82 bpm 79 bpm   Oxygen Saturation (Admit) 98 % - - - -   Oxygen Saturation (Exercise) 99 % - - - -   Rating of Perceived Exertion (Exercise) 8 - _0 Symptoms none - none none none   Comments walk test results - - - -   Duration - - Continue with 30 min of aerobic exercise without signs/symptoms of physical distress. Continue with 30 min of aerobic exercise without signs/symptoms of physical distress. Continue with 30 min of aerobic exercise without signs/symptoms of physical distress.   Intensity - - THRR unchanged THRR unchanged THRR unchanged     Progression   Progression - - Continue to progress workloads to maintain intensity without signs/symptoms of physical distress. Continue to progress workloads to maintain intensity without signs/symptoms of physical distress. Continue to progress workloads to maintain intensity without signs/symptoms of physical distress.   Average METs - - 3.52 3.4 3.63     Resistance Training   Training Prescription - - Yes Yes Yes   Weight - - 3 lb 5 lb 6 lb   Reps - - 10-15 10-15 10-15     Interval Training   Interval Training - - No No No     Treadmill   MPH - - 2.5 2.5 2.7   Grade - - 1.5 1.5 2.5   Minutes - - _1 METs - - 3.43 3.43 4     Recumbant Elliptical  Level - - - - 2.3   Minutes - - - - 15   METs - - - - 2.7     Elliptical   Level - - 1 1 -   Speed - - 2.7 2.7 -   Minutes - - 5 4 -     REL-XR   Level - - _0 Minutes - - _1 METs - -  3.6 - 4.2     Home Exercise Plan   Plans to continue exercise at - Home (comment)  walking, bike, elliptical, weights Home (comment)  walking, bike, elliptical, weights Home (comment)  walking, bike, elliptical, weights Home (comment)  walking, bike, elliptical, weights   Frequency - Add 3 additional days to program exercise sessions. Add 3 additional days to program exercise sessions. Add 3 additional days to program exercise sessions. Add 3 additional days to program exercise sessions.   Initial Home Exercises Provided - 05/23/20 05/23/20 - 05/23/20   Row Name 07/09/20 1300 07/23/20 1400 08/06/20 1200 08/22/20 1000 09/05/20 0800     Response to Exercise   Blood Pressure (Admit) 138/72 124/62 130/68 136/72 128/72   Blood Pressure (Exercise) 150/68 128/74 144/68 144/68 130/76   Blood Pressure (Exit) 132/70 120/64 108/56 118/70 130/72   Heart Rate (Admit) 82 bpm 70 bpm 75 bpm 75 bpm 80 bpm   Heart Rate (Exercise) 114 bpm 103 bpm 112 bpm 95 bpm 103 bpm   Heart Rate (Exit) 85 bpm 76 bpm 74 bpm 81 bpm 82 bpm   Rating of Perceived Exertion (Exercise) _2 Symptoms _3    Duration Continue with 30 min of aerobic exercise without signs/symptoms of physical distress. Continue with 30 min of aerobic exercise without signs/symptoms of physical distress. Continue with 30 min of aerobic exercise without signs/symptoms of physical distress. Continue with 30 min of aerobic exercise without signs/symptoms of physical distress. Continue with 30 min of aerobic exercise without signs/symptoms of physical distress.   Intensity _4      Progression   Progression Continue to progress workloads to maintain intensity without signs/symptoms of physical distress. Continue to progress workloads to maintain intensity without signs/symptoms of physical distress. Continue to progress workloads to maintain intensity without  signs/symptoms of physical distress. Continue to progress workloads to maintain intensity without signs/symptoms of physical distress. Continue to progress workloads to maintain intensity without signs/symptoms of physical distress.   Average METs 5.8 4.55 5.7 5.37 4.8     Resistance Training   Training Prescription _5    Weight 6 lb 6 lb 6 lb 6 lb 5 lb   Reps 10-15 10-15 10-15 10-15 10-15     Interval Training   Interval Training _6      Treadmill   MPH 2.8 2.7 2.7 2.8 3   Grade 2.5 2.5 3._7 Minutes _8 METs 4 4 4.37 4.3 4.54     Recumbant Elliptical   Level - - - 5.6 -   Minutes - - - 15 -   METs - - - 4.6 -     REL-XR   Level _9 Minutes _10 METs 5.8 5.1 7.1 7.2 5.1     Home Exercise Plan   Plans to continue  exercise at Home (comment)  walking, bike, elliptical, weights Home (comment)  walking, bike, elliptical, weights - Home (comment)  walking, bike, elliptical, weights Home (comment)  walking, bike, elliptical, weights   Frequency Add 3 additional days to program exercise sessions. Add 3 additional days to program exercise sessions. - Add 3 additional days to program exercise sessions. Add 3 additional days to program exercise sessions.   Initial Home Exercises Provided 05/23/20 05/23/20 - 05/23/20 05/23/20   Row Name 10/02/20 1200 10/15/20 0900           Response to Exercise   Blood Pressure (Admit) 134/70 134/70      Blood Pressure (Exercise) 154/66 132/64      Blood Pressure (Exit) 130/72 126/68      Heart Rate (Admit) 81 bpm 81 bpm      Heart Rate (Exercise) 109 bpm 108 bpm      Heart Rate (Exit) 90 bpm 90 bpm      Rating of Perceived Exertion (Exercise) 13 12      Symptoms none none      Duration Continue with 30 min of aerobic exercise without signs/symptoms of physical distress. Continue with 30 min of aerobic exercise without signs/symptoms of physical distress.      Intensity THRR  unchanged THRR unchanged             Progression   Progression Continue to progress workloads to maintain intensity without signs/symptoms of physical distress. Continue to progress workloads to maintain intensity without signs/symptoms of physical distress.      Average METs 4.95 6.07             Resistance Training   Training Prescription Yes Yes      Weight 6 lb 6 lb      Reps 10-15 10-15             Interval Training   Interval Training No No             Treadmill   MPH 3 3      Grade 3 3      Minutes 15 15      METs 4.54 4.54             REL-XR   Level 12 12      Speed 50 -      Minutes 15 15      METs 5.4 7.6             Home Exercise Plan   Plans to continue exercise at Home (comment)  walking, bike, elliptical, weights Home (comment)  walking, bike, elliptical, weights      Frequency Add 3 additional days to program exercise sessions. Add 3 additional days to program exercise sessions.      Initial Home Exercises Provided 05/23/20 05/23/20             Exercise Comments:  Exercise Comments    Row Name 05/17/20 8016           Exercise Comments First full day of exercise!  Patient was oriented to gym and equipment including functions, settings, policies, and procedures.  Patient's individual exercise prescription and treatment plan were reviewed.  All starting workloads were established based on the results of the 6 minute walk test done at initial orientation visit.  The plan for exercise progression was also introduced and progression will be customized based on patient's performance and goals.  Exercise Goals and Review:  Exercise Goals    Row Name 05/15/20 1314             Exercise Goals   Increase Physical Activity Yes       Intervention Provide advice, education, support and counseling about physical activity/exercise needs.;Develop an individualized exercise prescription for aerobic and resistive training based on initial  evaluation findings, risk stratification, comorbidities and participant's personal goals.       Expected Outcomes Short Term: Attend rehab on a regular basis to increase amount of physical activity.;Long Term: Add in home exercise to make exercise part of routine and to increase amount of physical activity.;Long Term: Exercising regularly at least 3-5 days a week.       Increase Strength and Stamina Yes       Intervention Develop an individualized exercise prescription for aerobic and resistive training based on initial evaluation findings, risk stratification, comorbidities and participant's personal goals.;Provide advice, education, support and counseling about physical activity/exercise needs.       Expected Outcomes Short Term: Increase workloads from initial exercise prescription for resistance, speed, and METs.;Short Term: Perform resistance training exercises routinely during rehab and add in resistance training at home;Long Term: Improve cardiorespiratory fitness, muscular endurance and strength as measured by increased METs and functional capacity (6MWT)       Able to understand and use rate of perceived exertion (RPE) scale Yes       Intervention Provide education and explanation on how to use RPE scale       Expected Outcomes Short Term: Able to use RPE daily in rehab to express subjective intensity level;Long Term:  Able to use RPE to guide intensity level when exercising independently       Able to understand and use Dyspnea scale Yes       Intervention Provide education and explanation on how to use Dyspnea scale       Expected Outcomes Short Term: Able to use Dyspnea scale daily in rehab to express subjective sense of shortness of breath during exertion;Long Term: Able to use Dyspnea scale to guide intensity level when exercising independently       Knowledge and understanding of Target Heart Rate Range (THRR) Yes       Intervention Provide education and explanation of THRR including how  the numbers were predicted and where they are located for reference       Expected Outcomes Short Term: Able to state/look up THRR;Short Term: Able to use daily as guideline for intensity in rehab;Long Term: Able to use THRR to govern intensity when exercising independently       Able to check pulse independently Yes       Intervention Provide education and demonstration on how to check pulse in carotid and radial arteries.;Review the importance of being able to check your own pulse for safety during independent exercise       Expected Outcomes Short Term: Able to explain why pulse checking is important during independent exercise;Long Term: Able to check pulse independently and accurately       Understanding of Exercise Prescription Yes       Intervention Provide education, explanation, and written materials on patient's individual exercise prescription       Expected Outcomes Short Term: Able to explain program exercise prescription;Long Term: Able to explain home exercise prescription to exercise independently              Exercise Goals Re-Evaluation :  Exercise Goals Re-Evaluation  [place] Name 05/17/20 [phone] 05/23/20 0736 05/29/20 0740 06/11/20 1718 06/14/20 0735     Exercise Goal Re-Evaluation   Exercise Goals Review Able to understand and use rate of perceived exertion (RPE) scale;Knowledge and understanding of Target Heart Rate Range (THRR);Able to understand and use Dyspnea scale;Understanding of Exercise Prescription Increase Physical Activity;Increase Strength and Stamina;Able to understand and use rate of perceived exertion (RPE) scale;Able to understand and use Dyspnea scale;Knowledge and understanding of Target Heart Rate Range (THRR);Understanding of Exercise Prescription;Able to check pulse independently Increase Physical Activity;Increase Strength and Stamina;Able to understand and use rate of perceived exertion (RPE) scale;Able to understand and use Dyspnea scale;Knowledge and  understanding of Target Heart Rate Range (THRR);Understanding of Exercise Prescription;Able to check pulse independently Increase Physical Activity;Increase Strength and Stamina;Able to understand and use rate of perceived exertion (RPE) scale Increase Physical Activity;Increase Strength and Stamina;Able to understand and use rate of perceived exertion (RPE) scale   Comments Reviewed RPE and dyspnea scales, THR and program prescription with pt today.  Pt voiced understanding and was given a copy of goals to take home. Reviewed home exercise with pt today.  Pt plans to use stationary bike and elliptical at home for exercise.  He will also continue to use his weights and walk when at beach too.  Reviewed THR, pulse, RPE, sign and symptoms, pulse oximetery and when to call 911 or MD.  Also discussed weather considerations and indoor options.  Pt voiced understanding. Allen hasnt tried to exercise at home yet.  He has an ellipitcal and bike at home.  He hasnt been sore since starting to exercise. Aleen tried the elliptical and made it 4 minutes !  he has increase to 5 lb for strength work.  Staff will monitor progress. Cycling at home and ellipital - usually 40 minutes1-2x/week. Bought weights (10 pounds) - before his heart event and will work up to them.   Expected Outcomes Short: Use RPE daily to regulate intensity. Long: Follow program prescription in THR. Short: Continue to exercise on off days  Long: Continue to improve stamina. Short:  add in home exercise Long: improve stamina and MET level Short: build up to 15 min on elliptical Long: improve overall MET level ST: increase exercise at home 1 extra day. LT: improve MET level   Row Name 06/26/20 0833 07/09/20 1333 07/23/20 1455 07/24/20 0723 08/06/20 1244     Exercise Goal Re-Evaluation   Exercise Goals Review Increase Physical Activity;Increase Strength and Stamina;Understanding of Exercise Prescription Increase Physical Activity;Increase Strength and  Stamina;Understanding of Exercise Prescription Increase Physical Activity;Increase Strength and Stamina;Understanding of Exercise Prescription Increase Physical Activity;Increase Strength and Stamina;Understanding of Exercise Prescription Increase Physical Activity;Increase Strength and Stamina   Comments Antony Haste has been doing well in rehab.  He is now up to level  2.3 on the recumbent elliptical!  We will continue to monitor his progress. Zenia Resides continues to progress well.  He is on level 12 on the XR and averaged 5.8 METs.  He continues to use 6 lb for strength work.  He has tried split squats during strength work for a challenge. Zenia Resides was at [employer] last week, walking everywhere.  He has been doing well in rehab.  He continues at level 12 on the XR.  We will continue to monitor his progress. Zenia Resides is doing well with exercise at home. He is riding his recumbant bike and doing the elliptical at home for 3 days/week on his off days from rehab. He totals about 20 minutes  for one session. Discussed increasing his total exercise sessions to 30 minutes. He uses 10 lbs at home for handweights. Zenia Resides is progressing well and has increased incline on TM to 3.5%.  He has moved up to 6 lb on weights.   Expected Outcomes Short: Talk about adding in intervals  Long: Continue to improve stamina. Short: add intervals Long: improve MET level Short: Talk about intervals again upon return.  Long: Continue to improve stamina. Short: Increase exercise duration sessions up to 30 minutes at home Long: Exercise independently at home with no complications or symptoms Short:  continue to exercise consistently at home and HT Long:  continue to improve stamina   Row Name 08/14/20 0748 08/22/20 1010 09/05/20 0811 09/17/20 1136 09/18/20 0734     Exercise Goal Re-Evaluation   Exercise Goals Review Increase Physical Activity;Increase Strength and Stamina;Understanding of Exercise Prescription Increase Physical Activity;Increase Strength and  Stamina;Understanding of Exercise Prescription Increase Physical Activity;Increase Strength and Stamina - Increase Physical Activity;Increase Strength and Stamina   Comments Zenia Resides is doing well in rehab.  He usually walks at the beach when he's there.  He will also do hist bike and elliptical at home.  He walks the dogs daily as well.  He feels that his strength and stamina continue to improve.  He says he feels the best he has in 20 years. Zenia Resides has been doing well in rehab.  He is currently enjoying his new grandbaby. He is walking daily and up to level 12 on the XR.  We will continue to monitor his progress. Zenia Resides is now at 3 mph and 3% incline on TM.  We expect him to improve post 6 MWT. Out since last review Zenia Resides is doing great. He is going to graduate in the next couple of weeks.and is still continuing to exercise at home. He still uses his  stationary bike and elliptical  an extra 2 days/week and walks almost daily. He will plan to continue that after graduation. He continues to  watch his HR and use RPE scale. He is pleased with his progress thus far in the program.   Expected Outcomes Short: Continue to make exercise a priority  Long: Continue to improve stamina. Short: Talk about intervals Long: Continue to improve stamina Short : improve post 6MWT Long:  complete HT program - Short: Improve on 6MWT  Long: Graduate from [facility] Name 10/02/20 1235 10/15/20 0925 10/16/20 0724         Exercise Goal Re-Evaluation   Exercise Goals Review Increase Physical Activity;Increase Strength and Stamina Increase Physical Activity;Increase Strength and Stamina;Understanding of Exercise Prescription Increase Physical Activity;Increase Strength and Stamina;Understanding of Exercise Prescription     Comments Allen improved post 6MWT by 31.5%  He will complete HT program soon and has exercise equipment at home. Zenia Resides has only been averaging about once a week for various readsons.  He continues to get closer  to graduating and will continue to workout on his own at home and walking on the beach. Zenia Resides has 3 more sessions left of [facility]. He is very pleased with his progress throughout the program. He is going to use his elliptical and recumbant bike at home after graduation. Right now, he is exercising on both pieces of equipment almost everyday for 40 minute sessions. Continues to monitor HR and RPE. He is looking forward to graduation.     Expected Outcomes Short: complete HT program Long:  maintain exercise on his own Short: Graduate  Long:  Continue to exercise independently Short: Graduate [facility] Long: Exercise at home independently at home            Discharge Exercise Prescription (Final Exercise Prescription Changes):  Exercise Prescription Changes - 10/15/20 0900      Response to Exercise   Blood Pressure (Admit) 134/70    Blood Pressure (Exercise) 132/64    Blood Pressure (Exit) 126/68    Heart Rate (Admit) 81 bpm    Heart Rate (Exercise) 108 bpm    Heart Rate (Exit) 90 bpm    Rating of Perceived Exertion (Exercise) 12    Symptoms none    Duration Continue with 30 min of aerobic exercise without signs/symptoms of physical distress.    Intensity THRR unchanged      Progression   Progression Continue to progress workloads to maintain intensity without signs/symptoms of physical distress.    Average METs 6.07      Resistance Training   Training Prescription Yes    Weight 6 lb    Reps 10-15      Interval Training   Interval Training No      Treadmill   MPH 3    Grade 3    Minutes 15    METs 4.54      REL-XR   Level 12    Minutes 15    METs 7.6      Home Exercise Plan   Plans to continue exercise at Home (comment)   walking, bike, elliptical, weights   Frequency Add 3 additional days to program exercise sessions.    Initial Home Exercises Provided 05/23/20           Nutrition:  Target Goals: Understanding of nutrition guidelines, daily intake of sodium  <1567m, cholesterol <2023m calories 30% from fat and 7% or less from saturated fats, daily to have 5 or more servings of fruits and vegetables.  Education: All About Nutrition: -Group instruction provided by verbal, written material, interactive activities, discussions, models, and posters to present general guidelines for heart healthy nutrition including fat, fiber, MyPlate, the role of sodium in heart healthy nutrition, utilization of the nutrition label, and utilization of this knowledge for meal planning. Follow up email sent as well. Written material given at graduation. [facility]  Date 05/17/20  [facility] Code 1- Verbalizes Understanding  [need identified]      Biometrics:  Pre Biometrics - 05/15/20 1314      Pre Biometrics   Height 5' 9.5" (1.765 m)    Weight 221 lb 4.8 oz (100.4 kg)    BMI (Calculated) 32.22    Single Leg Stand 4.72 seconds           Post Biometrics - 10/02/20 0811       Post  Biometrics   Height 5' 9.5" (1.765 m)    Weight 232 lb (105.2 kg)    BMI (Calculated) 33.78    Single Leg Stand 30 seconds           Nutrition Therapy Plan and Nutrition Goals:  Nutrition Therapy & Goals - 05/29/20 0817      Nutrition Therapy   Diet Heart healthy, Low Na, T2DM    Drug/Food Interactions Statins/Certain Fruits    Protein (specify units) 80g    Fiber 30 grams    Whole Grain Foods 3 servings    Saturated Fats 12 max. grams    Fruits and Vegetables 5 servings/day  Sodium 1.5 grams      Personal Nutrition Goals   Nutrition Goal ST: increase wate intake by 16oz per day  LT: Feel better 7/10 now - working on increasing energy    Comments Pt reports losing appetite and eating about half of his usual intake. B:OJ or low sugar grape juice and two pieces of toast and 2-3 slices of bacon (will not eat eggs). L: vary from meat with two vegetables to a sandwich. D: meat and  two vegetables or sandwich or hamburger. S: small amount of ice cream. cooks vegetables steamed, baked, and grilled. Likes to eat salad with dark leafy greens as his salad base. Drinks elderberry juice. Eats a variety vegetables. Eats peanut butter and tries to eat more fruit. Tries to eat whole wheat bread, will sometimes have oatmeal. Pt reports high fat foods turn him off to eating and has now switched to 2% milk. sugar free mountain dew and unsweetened or half and half tea. Pt reports that blood sugar has been good, only had lows right after surgery - now at low 100s. No longer on glipizide anymore, now just on trulicity and metformin. Pt report having rouble with drinking water, but wants to drink more.      Intervention Plan   Intervention Prescribe, educate and counsel regarding individualized specific dietary modifications aiming towards targeted core components such as weight, hypertension, lipid management, diabetes, heart failure and other comorbidities.;Nutrition handout(s) given to patient.    Expected Outcomes Short Term Goal: Understand basic principles of dietary content, such as calories, fat, sodium, cholesterol and nutrients.;Short Term Goal: A plan has been developed with personal nutrition goals set during dietitian appointment.;Long Term Goal: Adherence to prescribed nutrition plan.           Nutrition Assessments:  Nutrition Assessments - 05/15/20 1326      MEDFICTS Scores   Pre Score 74          MEDIFICTS Score Key:  ?70 Need to make dietary changes   40-70 Heart Healthy Diet  ? 40 Therapeutic Level Cholesterol Diet   Picture Your Plate Scores:  <46 Unhealthy dietary pattern with much room for improvement.  41-50 Dietary pattern unlikely to meet recommendations for good health and room for improvement.  51-60 More healthful dietary pattern, with some room for improvement.   >60 Healthy dietary pattern, although there may be some specific behaviors that  could be improved.    Nutrition Goals Re-Evaluation:  Nutrition Goals Re-Evaluation    Row Name 06/14/20 [id number] 07/24/20 0833 08/14/20 0755 09/18/20 0737 10/16/20 0751     Goals   Nutrition Goal ST: increase water intake by 16oz per day  LT: Feel better 7/10 now - working on increasing energy ST: increase water intake by 16oz per day  LT: Feel better 7/10 now - working on increasing energy ST: increase water intake by 16oz per day  LT: Feel better 7/10 now - working on increasing energy ST: increase water intake by 16oz per day  LT: Feel better 7/10 now - working on increasing energy ST: increase water intake by 16oz per day  LT: Feel better 7/10 now - working on increasing energy   Comment Pt reports ST goal going "so so". He reports some days that he will drink more than others. Will still want to work towards this goal. Start bringing your water with you and to your exercise. No changes in diet aside from increased fruit and vegetable intake. Patient will continue to work  on goals provided by RD. Patient does not report any new questions or concerns at this time. Zenia Resides is trying to increase his water intake.  He is making some changes and sticking with them. Zenia Resides has been drinking more water by being more mindful of how much he drinks per day. Overall, he has been limiting fried foods and red meat intake. He does not have any new goals or questions at this time and will continue to follow goals established by RD. He will be graduating in the next couple of weeks. Zenia Resides is getting ready to graduate and continues to follow goals established by the RD. He has no further goals or questions at this time. He is excited to graduate as he has 3  more sessions left.   Expected Outcome ST: increase water intake by 16oz per day  LT: Feel better 7/10 now - working on increasing energy ST: increase water intake by 16oz per day  LT: Feel better 7/10 now - working on increasing energy Short: Continue to work on water   Long: Continue to eat better. Short: Continue goals from RD Long: Maintain heart healthy diet Short: Continue to follow goals established by RD Long: Continue to make changes for a heart healthy diet          Nutrition Goals Discharge (Final Nutrition Goals Re-Evaluation):  Nutrition Goals Re-Evaluation - 10/16/20 0751      Goals   Nutrition Goal ST: increase water intake by 16oz per day  LT: Feel better 7/10 now - working on increasing energy    Comment Zenia Resides is getting ready to graduate and continues to follow goals established by the RD. He has no further goals or questions at this time. He is excited to graduate as he has 3  more sessions left.    Expected Outcome Short: Continue to follow goals established by RD Long: Continue to make changes for a heart healthy diet           Psychosocial: Target Goals: Acknowledge presence or absence of significant depression and/or stress, maximize coping skills, provide positive support system. Participant is able to verbalize types and ability to use techniques and skills needed for reducing stress and depression.   Education: Stress, Anxiety, and Depression - Group verbal and visual presentation to define topics covered.  Reviews how body is impacted by stress, anxiety, and depression.  Also discusses healthy ways to reduce stress and to treat/manage anxiety and depression.  Written material given at graduation. Flowsheet Row Cardiac Rehab from 08/16/2020 in [facility] Cardiac and Pulmonary Rehab  Date 06/14/20  Educator [facility]  Instruction Review Code 1- [employer] Understanding      Education: Sleep Hygiene -Provides group verbal and written instruction about how sleep can affect your health.  Define sleep hygiene, discuss sleep cycles and impact of sleep habits. Review good sleep hygiene tips.    Initial Review & Psychosocial Screening:  Initial Psych Review & Screening - 05/10/20 0939      Initial Review   Current issues with Current Stress  Concerns    Source of Stress Concerns Family    Comments mother has dementia (living at [place])      [place]? Yes   wife, cousins that live near by (he's an only child)     Barriers   Psychosocial barriers to participate in program There are no identifiable barriers or psychosocial needs.      Screening Interventions   Interventions Encouraged to  exercise;To provide support and resources with identified psychosocial needs;Provide feedback about the scores to participant    Expected Outcomes Short Term goal: Utilizing psychosocial counselor, staff and physician to assist with identification of specific Stressors or current issues interfering with healing process. Setting desired goal for each stressor or current issue identified.;Long Term Goal: Stressors or current issues are controlled or eliminated.;Short Term goal: Identification and review with participant of any Quality of Life or Depression concerns found by scoring the questionnaire.;Long Term goal: The participant improves quality of Life and PHQ9 Scores as seen by post scores and/or verbalization of changes           Quality of Life Scores:   Quality of Life - 05/15/20 1326      Quality of Life   Select Quality of Life      Quality of Life Scores   Health/Function Pre 29.57 %    Socioeconomic Pre 30 %    Psych/Spiritual Pre 30 %    Family Pre 30 %    GLOBAL Pre 29.82 %          Scores of 19 and below usually indicate a poorer quality of life in these areas.  A difference of  2-3 points is a clinically meaningful difference.  A difference of 2-3 points in the total score of the Quality of Life Index has been associated with significant improvement in overall quality of life, self-image, physical symptoms, and general health in studies assessing change in quality of life.  PHQ-9: Recent Review Flowsheet Data    Depression screen [facility] 2/9 08/07/2020 05/15/2020 07/18/2019 04/01/2017 04/01/2017    Decreased Interest 0 0 0 0 0   Down, Depressed, Hopeless 0 0 0 0 0   PHQ - 2 Score 0 0 0 0 0   Altered sleeping 0 0 - 2 -   Tired, decreased energy 0 0 - 1 -   Change in appetite 0 0 - 0 -   Feeling bad or failure about yourself  0 0 - 0 -   Trouble concentrating 0 0 - 0 -   Moving slowly or fidgety/restless 0 0 - 0 -   Suicidal thoughts 0 0 - 0 -   PHQ-9 Score 0 0 - 3 -   Difficult doing work/chores Not difficult at all Not difficult at all - - -     Interpretation of Total Score  Total Score Depression Severity:  1-4 = Minimal depression, 5-9 = Mild depression, 10-14 = Moderate depression, 15-19 = Moderately severe depression, 20-27 = Severe depression   Psychosocial Evaluation and Intervention:  Psychosocial Evaluation - 05/10/20 0946      Psychosocial Evaluation & Interventions   Interventions Encouraged to exercise with the program and follow exercise prescription    Comments Freida 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  He is a good spirited man. He is an only child but has a great support system with his wife and cousins that live nearby.  His mother is now living in [place] [facility] with dementia, which got worse while he was having to undergo surgery.  Overall, he is doing very well and eager to get going again.  He wants to get back to his normal and be able to walk on the beach again without having to stop to catch his breath.  He sleeps well at night and has no other major stressors currently.    Expected Outcomes Short: Attend rehab to build stamina  Long: Continue  to cope well with recovery    Continue Psychosocial Services  Follow up required by staff           Psychosocial Re-Evaluation:  Psychosocial Re-Evaluation    [place] Name 05/29/20 [phone] 06/14/20 0728 07/24/20 0735 08/14/20 0750 09/18/20 0739     Psychosocial Re-Evaluation   Current issues with Current Stress Concerns Current Stress Concerns Current Stress Concerns Current Stress Concerns Current Stress  Concerns   Comments Zenia Resides has no new stress concerns - says he has less stress since he got his taxes done. Zenia Resides has no new stress concerns - says he has less stress since he got his taxes mostly done and he is finishing them up now. He has planned travel for next year - [place], [place], [place], [place]. He is still having stress with his mom, but she is doing better with her care. Two daughters he has that he has no relationship with because of their mother. Wife is his support system as well as his cousins. He reports fishing  and cycling to help manage stress. 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 Zenia Resides just got back from [place] and had a really good time with his wife. He has a plan to go to [place] at the end of January. He continues to stress about his mom who currently lives in a home. He denies any other big stressors at this time. He has good support from his family. He is keeping busy right now decorating for the holidays. Zenia Resides enjoyed the [employer] at [employer] this past weekend.  He also did the Christmas Parade the week before down at the beach.  His [place] trip will be in February/March.  His mom is still biggest stressor and she is now in memory care at [facility].  He is doing well overall and enjoying the fesitivites of the holidays.  He is not looking forward to tax time this year. Zenia Resides is doing well mentally. He denies any new stressors at this time despite taking care of his mother. He is going to [place] at the end of February with his wife and is looking forward to going on vacation.Marland Kitchen He sleep is good. Has a colonoscopy coming up and is a little stressed on what the results may be. He is going to graduate in the next couple of weeks and is very pleased with how his exercise has been helping.   Expected Outcomes Short: continue to exercise to manage stress Long: maintain positive outlook Short: continue to exercise to manage stress Long:  maintain positive outlook Short: Continue attendance with [facility] for stress management Long: Maintain overall posititive attitude Short: Continue to enjoy holidays and rest in his mom's care Long: Continue to stay positive Short: Graduate from [place]: Maintain positive attitude and utilize exercise for stress management   Interventions - - Encouraged to attend Cardiac Rehabilitation for the exercise Encouraged to attend Cardiac Rehabilitation for the exercise Encouraged to attend Cardiac Rehabilitation for the exercise   Continue Psychosocial Services  - - Follow up required by staff Follow up required by staff Follow up required by staff   Comments - mother has dementia (living at [place]) - - Zenia Resides has enjoyed [place]. He thinks rehab has helped to hold himself accountable in keeping up with exercise. He realized how important it is to continue to monitor his vitals. He feels better when he leaves rehab and overall has enjoyed the program.  Initial Review   Source of Stress Concerns - None Identified;Family - - -   Row Name 10/16/20 0731             Psychosocial Re-Evaluation   Current issues with Current Stress Concerns       Comments Zenia Resides is still holding up well mentally. He goes to [place] on 2/21 with his wife. He is excited to go as he graduates after 3 more sessions. He did get polyps removed during this colonoscopy, is not stressed about it anymore and relieved he got the resulkts back. He is more stressed on it being tax season and figuring out his financials. He finds exercise helps him cope.       Expected Outcomes Short: Graduate from [place]: Continue to use exercise for stress management/ maintain positive attitude       Interventions Encouraged to attend Cardiac Rehabilitation for the exercise       Continue Psychosocial Services  Follow up required by staff              Psychosocial Discharge (Final Psychosocial Re-Evaluation):  Psychosocial  Re-Evaluation - 10/16/20 0731      Psychosocial Re-Evaluation   Current issues with Current Stress Concerns    Comments Zenia Resides is still holding up well mentally. He goes to [place] on 2/21 with his wife. He is excited to go as he graduates after 3 more sessions. He did get polyps removed during this colonoscopy, is not stressed about it anymore and relieved he got the resulkts back. He is more stressed on it being tax season and figuring out his financials. He finds exercise helps him cope.    Expected Outcomes Short: Graduate from [place]: Continue to use exercise for stress management/ maintain positive attitude    Interventions Encouraged to attend Cardiac Rehabilitation for the exercise    Continue Psychosocial Services  Follow up required by staff           Vocational Rehabilitation: Provide vocational rehab assistance to qualifying candidates.   Vocational Rehab Evaluation & Intervention:  Vocational Rehab - 05/10/20 9892      Initial Vocational Rehab Evaluation & Intervention   Assessment shows need for Vocational Rehabilitation No   retired          Education: Education Goals: Education classes will be provided on a variety of topics geared toward better understanding of heart health and risk factor modification. Participant will state understanding/return demonstration of topics presented as noted by education test scores.  Learning Barriers/Preferences:  Learning Barriers/Preferences - 05/10/20 1194      Learning Barriers/Preferences   Learning Barriers Sight   glasses for reading   Learning Preferences None           General Cardiac Education Topics:  AED/CPR: - Group verbal and written instruction with the use of models to demonstrate the basic use of the AED with the basic ABC's of resuscitation.   Anatomy and Cardiac Procedures: - Group verbal and visual presentation and models provide information about basic cardiac anatomy and function. Reviews  the testing methods done to diagnose heart disease and the outcomes of the test results. Describes the treatment choices: Medical Management, Angioplasty, or Coronary Bypass Surgery for treating various heart conditions including Myocardial Infarction, Angina, Valve Disease, and Cardiac Arrhythmias.  Written material given at graduation. Flowsheet Row Cardiac Rehab from 05/23/2020 in [facility] Cardiac and Pulmonary Rehab  Date 05/15/20  Instruction Review Code 3- Needs Reinforcement  [need identified]  Medication Safety: - Group verbal and visual instruction to review commonly prescribed medications for heart and lung disease. Reviews the medication, class of the drug, and side effects. Includes the steps to properly store meds and maintain the prescription regimen.  Written material given at graduation. Flowsheet Row Cardiac Rehab from 05/23/2020 in [facility] Cardiac and Pulmonary Rehab  Date 05/23/20  Educator SB  Instruction Review Code 1- Verbalizes Understanding      Intimacy: - Group verbal instruction through game format to discuss how heart and lung disease can affect sexual intimacy. Written material given at graduation..   Know Your Numbers and Heart Failure: - Group verbal and visual instruction to discuss disease risk factors for cardiac and pulmonary disease and treatment options.  Reviews associated critical values for Overweight/Obesity, Hypertension, Cholesterol, and Diabetes.  Discusses basics of heart failure: signs/symptoms and treatments.  Introduces Heart Failure Zone chart for action plan for heart failure.  Written material given at graduation. Flowsheet Row Cardiac Rehab from 08/16/2020 in [facility] Cardiac and Pulmonary Rehab  Date 07/31/20  Educator SB  Instruction Review Code 1- Verbalizes Understanding      Infection Prevention: - Provides verbal and written material to individual with discussion of infection control including proper hand washing and proper equipment  cleaning during exercise session. Flowsheet Row Cardiac Rehab from 05/23/2020 in [facility] Cardiac and Pulmonary Rehab  Date 05/15/20  Educator [facility]  Instruction Review Code 1- Verbalizes Understanding      Falls Prevention: - Provides verbal and written material to individual with discussion of falls prevention and safety. Flowsheet Row Cardiac Rehab from 05/23/2020 in [facility] Cardiac and Pulmonary Rehab  Date 05/15/20  Educator [facility]  Instruction Review Code 1- Verbalizes Understanding      Other: -Provides group and verbal instruction on various topics (see comments)   Knowledge Questionnaire Score:  Knowledge Questionnaire Score - 05/15/20 1326      Knowledge Questionnaire Score   Pre Score 24/28 Education Focus: Angina, Nutrtition, Exercise           Core Components/Risk Factors/Patient Goals at Admission:  Personal Goals and Risk Factors at Admission - 05/15/20 1329      Core Components/Risk Factors/Patient Goals on Admission    Weight Management Yes;Obesity;Weight Loss    Intervention Weight Management: Develop a combined nutrition and exercise program designed to reach desired caloric intake, while maintaining appropriate intake of nutrient and fiber, sodium and fats, and appropriate energy expenditure required for the weight goal.;Weight Management: Provide education and appropriate resources to help participant work on and attain dietary goals.;Weight Management/Obesity: Establish reasonable short term and long term weight goals.;Obesity: Provide education and appropriate resources to help participant work on and attain dietary goals.    Admit Weight 221 lb 4.8 oz (100.4 kg)    Goal Weight: Short Term 215 lb (97.5 kg)    Goal Weight: Long Term 200 lb (90.7 kg)    Expected Outcomes Short Term: Continue to assess and modify interventions until short term weight is achieved;Long Term: Adherence to nutrition and physical activity/exercise program aimed toward attainment of established  weight goal;Weight Loss: Understanding of general recommendations for a balanced deficit meal plan, which promotes 1-2 lb weight loss per week and includes a negative energy balance of [phone] kcal/d;Understanding recommendations for meals to include 15-35% energy as protein, 25-35% energy from fat, 35-60% energy from carbohydrates, less than 223m of dietary cholesterol, 20-35 gm of total fiber daily;Understanding of distribution of calorie intake throughout the day with the consumption of  4-5 meals/snacks    Diabetes Yes    Intervention Provide education about signs/symptoms and action to take for hypo/hyperglycemia.;Provide education about proper nutrition, including hydration, and aerobic/resistive exercise prescription along with prescribed medications to achieve blood glucose in normal ranges: Fasting glucose 65-99 mg/dL    Expected Outcomes Short Term: Participant verbalizes understanding of the signs/symptoms and immediate care of hyper/hypoglycemia, proper foot care and importance of medication, aerobic/resistive exercise and nutrition plan for blood glucose control.;Long Term: Attainment of HbA1C < 7%.    Hypertension Yes    Intervention Provide education on lifestyle modifcations including regular physical activity/exercise, weight management, moderate sodium restriction and increased consumption of fresh fruit, vegetables, and low fat dairy, alcohol moderation, and smoking cessation.;Monitor prescription use compliance.    Expected Outcomes Short Term: Continued assessment and intervention until BP is < 140/18m HG in hypertensive participants. < 130/821mHG in hypertensive participants with diabetes, heart failure or chronic kidney disease.;Long Term: Maintenance of blood pressure at goal levels.    Lipids Yes    Intervention Provide education and support for participant on nutrition & aerobic/resistive exercise along with prescribed medications to achieve LDL <7020mHDL >53m24m  Expected  Outcomes Short Term: Participant states understanding of desired cholesterol values and is compliant with medications prescribed. Participant is following exercise prescription and nutrition guidelines.;Long Term: Cholesterol controlled with medications as prescribed, with individualized exercise RX and with personalized nutrition plan. Value goals: LDL < 70mg93mL > 40 mg.           Education:Diabetes - Individual verbal and written instruction to review signs/symptoms of diabetes, desired ranges of glucose level fasting, after meals and with exercise. Acknowledge that pre and post exercise glucose checks will be done for 3 sessions at entry of program. [place] [facility]  Date 05/15/20  Educator JH  [facility] Code 1- Verbalizes Understanding      Core Components/Risk Factors/Patient Goals Review:   Goals and Risk Factor Review    Row Name 05/29/20 0739 05/31/20 0728 06/14/20 0733 07/24/20 0728 08/14/20 0758     Core Components/Risk Factors/Patient Goals Review   Personal Goals Review Weight Management/Obesity;Hypertension;Diabetes;Lipids Weight Management/Obesity;Hypertension;Diabetes;Lipids Weight Management/Obesity;Hypertension;Diabetes;Lipids Weight Management/Obesity;Hypertension;Diabetes Weight Management/Obesity;Hypertension;Diabetes   Review Allen checks his BG at home on days not at HT.  [facility] 110.  He reports taking meds as directed. - AllZenia Residesks his BG at home on days not at HT. [facility] 101 in am. He reports taking meds as directed. Weight is staying stable, AllenZenia Residesrying to maintain his weight and is happy where he is. He lost weight after his surgery and has stayed about the same since. He is checking his sugars everyday and have been staying around 110-120. He does recognize symptoms if he is too low. He does not check his BP at home as he does not have a cuff. BPs  have been ranging [phone]. AllenZenia Residesoing well in rehab. His weight has continued to be stable.  His pressures have been up a little bit but staying within range other than after this weekend of drinking.  His sugars have continued to do well with all of his metformin.   Expected Outcomes Short: attend HT consistently Long: manage risk factors on his own - Short: attend HT consistently Long: manage risk factors on his own Short: Continue checking  blood sugars and weights regularly Long: Manage lifestyle risk factors Short: Continue to monitor sugars closely Long: Continue to monitor risk factors.   [place] Name 09/18/20 0750 10/16/20 0728           Core Components/Risk Factors/Patient Goals Review   Personal Goals Review Weight Management/Obesity;Hypertension;Diabetes Weight Management/Obesity;Hypertension;Diabetes      Review Zenia Resides is doing well. He is still checking sugars at home, ranges around 110 in the morning. BPs have been stable here at rehab, he is thinking about purchasing his own cuff to use at home now that he is getting close to graduation . Weight has been the same around 228 lb,  right now his goal is to maintain. He weighs himself at home weekly. Zenia Resides still hasn't bought a BP cuff for at home use- but plans to get one after he graduate. He is still checking his sugars and remains around 110 for his fasting BG.  Weight has went up a little but he has been on the go and eating out more as he has been traveling to the beach. Zenia Resides is graduating after 3 more sessions and will continue to follow with his changes.      Expected Outcomes Short: Continue to monitor vitals at home Long: Graduate from [facility] and continue to manage risk factors Short: Graduate from [place]: Continue to manage lifestyle risk factors             Core Components/Risk Factors/Patient Goals at Discharge (Final Review):   Goals  and Risk Factor Review - 10/16/20 0728      Core Components/Risk Factors/Patient Goals Review   Personal Goals Review Weight Management/Obesity;Hypertension;Diabetes    Review Zenia Resides still hasn't bought a BP cuff for at home use- but plans to get one after he graduate. He is still checking his sugars and remains around 110 for his fasting BG.  Weight has went up a little but he has been on the go and eating out more as he has been traveling to the beach. Zenia Resides is graduating after 3 more sessions and will continue to follow with his changes.    Expected Outcomes Short: Graduate from [place]: Continue to manage lifestyle risk factors           ITP Comments:  ITP Comments    Row Name 05/10/20 0949 05/15/20 1304 05/16/20 1624 05/17/20 0824 06/13/20 0613   ITP Comments Completed virtual orientation today.  EP evaluation is scheduled for Tues 9/7 at 9am.  Documentation for diagnosis can be found in [facility] encounter 04/04/20. Completed 6MWT and gym orientation. Initial ITP created and sent for review to Dr. Emily Filbert, Medical Director. 30 day review completed. ITP sent to Dr. Emily Filbert, Medical Director of Cardiac and Pulmonary Rehab. Continue with ITP unless changes are made by physician. First full day of exercise!  Patient was oriented to gym and equipment including functions, settings, policies, and procedures.  Patient's individual exercise prescription and treatment plan were reviewed.  All starting workloads were established based on the results of the 6 minute walk test done at initial orientation visit.  The plan for exercise progression was also introduced and progression will be customized based on patient's performance and goals. 30 Day review completed. Medical Director ITP review done, changes made as directed, and signed approval by Medical Director.   [place] Name 07/11/20 0713 08/08/20 1040 09/05/20 0627 09/17/20 1135 10/03/20 0941   ITP Comments 30 Day review completed. Medical Director  ITP review done, changes made  as directed, and signed approval by Market researcher. 30 Day review completed. Medical Director ITP review done, changes made as directed, and signed approval by Medical Director. 30 Day review completed. Medical Director ITP review done, changes made as directed, and signed approval by Medical Director. Been out with family in town 55 Day review completed. Medical Director ITP review done, changes made as directed, and signed approval by Medical Director.   [place] Name 10/25/20 0718 10/30/20 1454         ITP Comments Alazar graduated today from  rehab with 35 sessions completed.  Details of the patient's exercise prescription and what He needs to do in order to continue the prescription and progress were discussed with patient.  Patient was given a copy of prescription and goals.  Patient verbalized understanding.  Suraj plans to continue to exercise by walking at home. Discharged             Comments: discharge ITP

## 2020-11-08 ENCOUNTER — Other Ambulatory Visit: Payer: Self-pay | Admitting: Family Medicine

## 2020-11-08 DIAGNOSIS — R059 Cough, unspecified: Secondary | ICD-10-CM

## 2020-11-08 DIAGNOSIS — U071 COVID-19: Secondary | ICD-10-CM

## 2020-11-08 MED ORDER — HYDROCOD POLST-CPM POLST ER 10-8 MG/5ML PO SUER
5.0000 mL | Freq: Two times a day (BID) | ORAL | 0 refills | Status: AC | PRN
Start: 2020-11-08 — End: ?

## 2020-11-09 ENCOUNTER — Ambulatory Visit (HOSPITAL_COMMUNITY)
Admission: RE | Admit: 2020-11-09 | Discharge: 2020-11-09 | Disposition: A | Discharge status: Home / Self Care | Payer: 59 | Source: Ambulatory Visit | Attending: Pulmonary Disease | Admitting: Pulmonary Disease

## 2020-11-09 ENCOUNTER — Telehealth: Payer: Self-pay

## 2020-11-09 ENCOUNTER — Other Ambulatory Visit: Payer: Self-pay | Admitting: Nurse Practitioner

## 2020-11-09 DIAGNOSIS — Z951 Presence of aortocoronary bypass graft: Secondary | ICD-10-CM

## 2020-11-09 DIAGNOSIS — U071 COVID-19: Secondary | ICD-10-CM | POA: Insufficient documentation

## 2020-11-09 DIAGNOSIS — E1169 Type 2 diabetes mellitus with other specified complication: Secondary | ICD-10-CM

## 2020-11-09 MED ORDER — ALBUTEROL SULFATE HFA 108 (90 BASE) MCG/ACT IN AERS: 2.0000 | INHALATION_SPRAY | Freq: Once | RESPIRATORY_TRACT | Status: DC | PRN

## 2020-11-09 MED ORDER — FAMOTIDINE IN NACL 20-0.9 MG/50ML-% IV SOLN: 20.0000 mg | Freq: Once | INTRAVENOUS | Status: DC | PRN

## 2020-11-09 MED ORDER — SODIUM CHLORIDE 0.9 % IV SOLN: INTRAVENOUS | Status: DC | PRN

## 2020-11-09 MED ORDER — EPINEPHRINE 0.3 MG/0.3ML IJ SOAJ: 0.3000 mg | Freq: Once | INTRAMUSCULAR | Status: DC | PRN

## 2020-11-09 MED ORDER — SOTROVIMAB 500 MG/8ML IV SOLN
500.0000 mg | Freq: Once | INTRAVENOUS | Status: AC
  Administered 2020-11-09: 500 mg via INTRAVENOUS

## 2020-11-09 MED ORDER — METHYLPREDNISOLONE SODIUM SUCC 125 MG IJ SOLR: 125.0000 mg | Freq: Once | INTRAMUSCULAR | Status: DC | PRN

## 2020-11-09 MED ORDER — DIPHENHYDRAMINE HCL 50 MG/ML IJ SOLN: 50.0000 mg | Freq: Once | INTRAMUSCULAR | Status: DC | PRN

## 2020-11-09 NOTE — Telephone Encounter (Signed)
Called to discuss with patient about COVID-19 symptoms and the use of one of the available treatments for those with mild to moderate Covid symptoms and at a high risk of hospitalization.  Pt appears to qualify for outpatient treatment due to co-morbid conditions and/or a member of an at-risk group in accordance with the FDA Emergency Use Authorization.    Symptom onset: 11/04/20 Cough,sinus congestion, sore throat Vaccinated: Yes Booster? Yes Immunocompromised? No Qualifiers: HTN,CAD,DM  Pt. Would like to speak with APP.  Marcello Moores

## 2020-11-09 NOTE — Discharge Instructions (Signed)

## 2020-11-09 NOTE — Progress Notes (Signed)
Diagnosis: COVID-19  Physician: Dr. Patrick Wright  Procedure: Covid Infusion Clinic Med: Sotrovimab infusion - Provided patient with sotrovimab fact sheet for patients, parents, and caregivers prior to infusion.   Complications: No immediate complications noted  Discharge: Discharged home    

## 2020-11-09 NOTE — Progress Notes (Signed)
Patient reviewed Fact Sheet for Patients, Parents, and Caregivers for Emergency Use Authorization (EUA) of sotrovimab for the Treatment of Coronavirus. Patient also reviewed and is agreeable to the estimated cost of treatment. Patient is agreeable to proceed.   

## 2020-11-09 NOTE — Progress Notes (Signed)
I connected by phone with Reginald Murphy on 11/09/2020 at 11:20 AM to discuss the potential use of a new treatment for mild to moderate COVID-19 viral infection in non-hospitalized patients.  This patient is a 65 y.o. male that meets the FDA criteria for Emergency Use Authorization of COVID monoclonal antibody sotrovimab.  Has a (+) direct SARS-CoV-2 viral test result  Has mild or moderate COVID-19   Is NOT hospitalized due to COVID-19  Is within 10 days of symptom onset  Has at least one of the high risk factor(s) for progression to severe COVID-19 and/or hospitalization as defined in EUA.  Specific high risk criteria : BMI > 25 and Cardiovascular disease or hypertension   I have spoken and communicated the following to the patient or parent/caregiver regarding COVID monoclonal antibody treatment:  1. FDA has authorized the emergency use for the treatment of mild to moderate COVID-19 in adults and pediatric patients with positive results of direct SARS-CoV-2 viral testing who are 20 years of age and older weighing at least 40 kg, and who are at high risk for progressing to severe COVID-19 and/or hospitalization.  2. The significant known and potential risks and benefits of COVID monoclonal antibody, and the extent to which such potential risks and benefits are unknown.  3. Information on available alternative treatments and the risks and benefits of those alternatives, including clinical trials.  4. Patients treated with COVID monoclonal antibody should continue to self-isolate and use infection control measures (e.g., wear mask, isolate, social distance, avoid sharing personal items, clean and disinfect "high touch" surfaces, and frequent handwashing) according to CDC guidelines.   5. The patient or parent/caregiver has the option to accept or refuse COVID monoclonal antibody treatment.  After reviewing this information with the patient, the patient has agreed to receive one of the  available covid 19 monoclonal antibodies and will be provided an appropriate fact sheet prior to infusion. [name], NP 11/09/2020 11:20 AM

## 2020-11-13 ENCOUNTER — Telehealth: Payer: Self-pay | Admitting: Licensed Clinical Social Worker

## 2020-11-13 ENCOUNTER — Other Ambulatory Visit: Payer: Self-pay

## 2020-11-13 ENCOUNTER — Inpatient Hospital Stay: Payer: 59 | Admitting: Licensed Clinical Social Worker

## 2020-11-13 ENCOUNTER — Inpatient Hospital Stay: Payer: 59

## 2020-11-13 NOTE — Telephone Encounter (Signed)
Patient went to [facility] on Friday 11/09/20 [place] - today is day 11 - infusion center told them to come to this appt even though covid positive due to it being day 11 --- making sure we are still doing 14 days    Per [place] Ar - to be safe will reschedule appt to next Tuesday

## 2020-11-20 ENCOUNTER — Inpatient Hospital Stay: Payer: 59 | Attending: Oncology | Admitting: Licensed Clinical Social Worker

## 2020-11-20 ENCOUNTER — Inpatient Hospital Stay: Payer: 59

## 2020-11-20 ENCOUNTER — Encounter: Payer: Self-pay | Admitting: Licensed Clinical Social Worker

## 2020-11-20 DIAGNOSIS — Z860100 Personal history of colon polyps, unspecified: Secondary | ICD-10-CM | POA: Insufficient documentation

## 2020-11-20 DIAGNOSIS — Z809 Family history of malignant neoplasm, unspecified: Secondary | ICD-10-CM

## 2020-11-20 NOTE — Progress Notes (Signed)
REFERRING PROVIDER: [name], MD [address],  [place] [address]  PRIMARY PROVIDER:  Jerrol Banana., MD  PRIMARY REASON FOR VISIT:  1. Family history of cancer   2. Personal history of colonic polyps       HISTORY OF PRESENT ILLNESS:   Reginald Murphy, a 65 y.o. male, was seen for a [facility] cancer genetics consultation at the request of Dr. Vicente Males due to a personal history of colon polyps.  Reginald Murphy presents to clinic today to discuss the possibility of a hereditary predisposition to cancer, genetic testing, and to further clarify his future cancer risks, as well as potential cancer risks for family members.   Reginald Murphy is a 65 y.o. male with no personal history of cancer.  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  CANCER HISTORY:  Oncology History   No history exists.     Past Medical History:  Diagnosis Date  . Diabetes mellitus without complication ([place])   . Family history of cancer   . Hypertension   . Personal history of colonic polyps     Past Surgical History:  Procedure Laterality Date  . COLONOSCOPY WITH PROPOFOL N/A 09/20/2020   Procedure: COLONOSCOPY WITH PROPOFOL;  Surgeon: [name], MD;  Location[facility] ENDOSCOPY;  Service: Gastroenterology;  Laterality: N/A;  . LEFT HEART CATH AND CORONARY ANGIOGRAPHY Left 03/27/2020   Procedure: LEFT HEART CATH AND CORONARY ANGIOGRAPHY;  Surgeon: Teodoro Spray, MD;  Location: [facility];  Service: Cardiovascular;  Laterality: Left;    Social History   Socioeconomic History  . Marital status: Married    Spouse name: Juliann Pulse   . Number of children: 2  . Years of education: Not on file  . Highest education level: Not on file  Occupational History  . Not on file  Tobacco Use  . Smoking status: Never Smoker  . Smokeless tobacco: Never Used  Vaping Use  . Vaping Use: Never used  Substance and Sexual Activity  .  Alcohol use: Yes    Alcohol/week: 0.0 standard drinks    Comment: occasionally  . Drug use: No  . Sexual activity: Not on file  Other Topics Concern  . Not on file  Social History Narrative  . Not on file   Social Determinants of Health   Financial Resource Strain: Not on file  Food Insecurity: Not on file  Transportation Needs: Not on file  Physical Activity: Not on file  Stress: Not on file  Social Connections: Not on file     FAMILY HISTORY:  We obtained a detailed, 4-generation family history.  Significant diagnoses are listed below: Family History  Problem Relation Age of Onset  . Diabetes Mother   . Seizures Father   . Heart attack Maternal Grandmother   . Cancer Maternal Grandfather        unk type   Reginald Murphy has 2 daughters, no cancers. He does not have siblings.  Reginald Murphy mother is living at 59 with Alzheimer's, no history of cancer. She did not have siblings. Maternal grandfather had cancer, unknown type.  Reginald Murphy's father died at 57 due to heart issues. He had 10 siblings, none had cancer that patient is aware of. A paternal cousin did have part of her colon removed and gets colonoscopies every year, unsure if she has had genetic testing. No information about paternal grandparents.  Reginald Murphy is unaware of previous family history of genetic testing for hereditary cancer risks. Patient's maternal ancestors are of German and Scottish descent, and paternal ancestors are of English descent. There is no reported Ashkenazi Jewish ancestry. There is no known consanguinity.  GENETIC COUNSELING ASSESSMENT: Reginald Murphy is a 65 y.o. male with a personal history of 10 colon polyps which is somewhat suggestive of a hereditary cancer syndrome and predisposition to cancer. We, therefore, discussed and recommended the following at today's visit.   DISCUSSION: We discussed that polyps in general are common, however, most people have fewer than 5 lifetime polyps.  When an individual  has 10 or more polyps we become concerned about an underlying polyposis syndrome.  The most common hereditary polyposis syndromes are caused by problems in the APC and MUTYH genes, however, more recently, mutations in the NTHL1 and MSH3 genes have been identified in some polyposis families. We discussed that testing is beneficial for several reasons including knowing how to follow individuals for cancer screenings, and understand if other family members could be at risk for cancer and allow them to undergo genetic testing.   We reviewed the characteristics, features and inheritance patterns of hereditary cancer syndromes. We also discussed genetic testing, including the appropriate family members to test, the process of testing, insurance coverage and turn-around-time for results. We discussed the implications of a negative, positive and/or variant of uncertain significant result. We recommended Reginald Murphy pursue genetic testing for the [facility] CancerNext-Expanded+RNA gene panel.  The CancerNext-Expanded + RNAinsight gene panel offered by [facility] and includes sequencing and rearrangement analysis for the following 77 genes: IP, ALK, APC*, ATM*, AXIN2, BAP1, BARD1, BLM, BMPR1A, BRCA1*, BRCA2*, BRIP1*, CDC73, CDH1*,CDK4, CDKN1B, CDKN2A, CHEK2*, CTNNA1, DICER1, FANCC, FH, FLCN, GALNT12, KIF1B, LZTR1, MAX, MEN1, MET, MLH1*, MSH2*, MSH3, MSH6*, MUTYH*, NBN, NF1*, NF2, NTHL1, PALB2*, PHOX2B, PMS2*, POT1, PRKAR1A, PTCH1, PTEN*, RAD51C*, RAD51D*,RB1, RECQL, RET, SDHA, SDHAF2, SDHB, SDHC, SDHD, SMAD4, SMARCA4, SMARCB1, SMARCE1, STK11, SUFU, TMEM127, TP53*,TSC1, TSC2, VHL and XRCC2 (sequencing and deletion/duplication); EGFR, EGLN1, HOXB13, KIT, MITF, PDGFRA, POLD1 and POLE (sequencing only); EPCAM and GREM1 (deletion/duplication only).   Based on Reginald Murphy's personal history of polyps, he meets medical criteria for genetic testing. Despite that he meets criteria, he may still have an out of pocket cost. We discussed  that if his out of pocket cost for testing is over $100, the laboratory will call and confirm whether he wants to proceed with testing.  If the out of pocket cost of testing is less than $100 he will be billed by the genetic testing laboratory.   PLAN: After considering the risks, benefits, and limitations, Mr. Murphy provided informed consent to pursue genetic testing and the blood sample was sent to [facility] for analysis of the CancerNext-Expanded+RNA panel. Results should be available within approximately 2-3 weeks' time, at which point they will be disclosed by telephone to Reginald Murphy, as will any additional recommendations warranted by these results. Reginald Murphy will receive a summary of his genetic counseling visit and a copy of his results once available. This information will also be available in Epic.   Reginald Murphy's questions were answered to his satisfaction today. Our contact information was provided should additional questions or concerns arise. Thank you for the referral and allowing us to share in the care of your patient.   Brianna Cowan, MS, LCGC Genetic Counselor [email or web address] Phone: [phone]  The patient was seen for a total of 30 minutes in face-to-face genetic counseling. Patient's   wife was also present. Dr. Finnegan was available for discussion regarding this case.   _______________________________________________________________________ For Office Staff:  Number of people involved in session: 2 Was an Intern/ student involved with case: no  

## 2020-11-21 ENCOUNTER — Other Ambulatory Visit: Payer: Self-pay | Admitting: Family Medicine

## 2020-11-21 DIAGNOSIS — N529 Male erectile dysfunction, unspecified: Secondary | ICD-10-CM

## 2020-11-21 NOTE — Telephone Encounter (Signed)
Requested Prescriptions  Pending Prescriptions Disp Refills  . tadalafil (CIALIS) 20 MG tablet [Pharmacy Med Name: TADALAFIL 20 MG TABLET] 30 tablet 5    Sig: TAKE ONE TABLET BY MOUTH EVERY THREE DAYS AS NEEDED FOR ERECTILE DYSFUNCTION     Urology: Erectile Dysfunction Agents Failed - 11/21/2020  1:59 PM      Failed - Last BP in normal range    BP Readings from Last 1 Encounters:  11/09/20 (!) 135/96         Passed - Valid encounter within last 12 months    Recent Outpatient Visits          3 months ago Annual physical exam   [facility] Jerrol Banana., MD   6 months ago Diabetes mellitus without complication [facility])   [facility] Jerrol Banana., MD   1 year ago Diabetes mellitus without complication [facility])   [facility] Jerrol Banana., MD   1 year ago Diabetes mellitus without complication [facility])   [facility] Jerrol Banana., MD   1 year ago Diabetes mellitus without complication [facility])   [facility] Jerrol Banana., MD      Future Appointments            In 1 week Jerrol Banana., MD [facility]

## 2020-11-29 NOTE — Progress Notes (Signed)
Established patient visit   Patient: Reginald Murphy   DOB: Oct 29, 1955   65 y.o. Male  MRN: [id number] Visit Date: 12/03/2020  Today's healthcare provider: [name], MD   Chief Complaint  Patient presents with  . Diabetes  . Hypertension   Subjective    HPI  Is doing well post CABG. he is using BiPAP nightly. Diabetes Mellitus Type II, follow-up  Lab Results  Component Value Date   HGBA1C 5.9 (A) 12/03/2020   HGBA1C 6.2 (H) 08/07/2020   HGBA1C 5.9 (A) 05/23/2020   Last seen for diabetes 4 months ago.  Management since then includes continuing the same treatment. He reports good compliance with treatment. He is not having side effects.   Home blood sugar records: trend: stable  Episodes of hypoglycemia? No    Current insulin regiment: none Most Recent Eye Exam: due  Hypertension, follow-up  BP Readings from Last 3 Encounters:  12/03/20 (!) 160/90  11/09/20 (!) 135/96  09/20/20 133/84   Wt Readings from Last 3 Encounters:  12/03/20 233 lb 9.6 oz (106 kg)  10/02/20 232 lb (105.2 kg)  09/20/20 228 lb (103.4 kg)     He was last seen for hypertension 4 months ago.  BP at that visit was 161/96. Management since that visit includes; on amlodipine, lisinopril, and metoprolol. He reports good compliance with treatment. He is not having side effects.  He is not exercising. He is adherent to low salt diet.   Outside blood pressures are checked occasionally.  He does not smoke.  Use of agents associated with hypertension: none.       Medications: Outpatient Medications Prior to Visit  Medication Sig  . ACCU-CHEK GUIDE test strip Please specify directions, refills and quantity  . aspirin EC 81 MG tablet Take 81 mg by mouth daily.   Marland Kitchen atorvastatin (LIPITOR) 40 MG tablet Take 40 mg by mouth at bedtime.  . Blood Glucose Monitoring Suppl (ACCU-CHEK NANO SMARTVIEW) w/Device KIT 1 strip by Does not apply route daily.  Marland Kitchen glucose blood test strip 1  each by Other route as needed for other. Use as instructed  . metFORMIN (GLUCOPHAGE) 1000 MG tablet TAKE 1 TABLET BY MOUTH 2 TIMES DAILY WITH A MEAL.  . metoprolol tartrate (LOPRESSOR) 25 MG tablet Take by mouth.  Glory Rosebush DELICA LANCETS 27M MISC 2 (two) times daily. for testing  . albuterol (VENTOLIN HFA) 108 (90 Base) MCG/ACT inhaler TAKE 2 PUFFS BY MOUTH EVERY 6 HOURS AS NEEDED FOR WHEEZE OR SHORTNESS OF BREATH  . amLODipine (NORVASC) 5 MG tablet TAKE 1 TABLET BY MOUTH EVERY DAY  . chlorpheniramine-HYDROcodone (TUSSIONEX PENNKINETIC ER) 10-8 MG/5ML SUER Take 5 mLs by mouth every 12 (twelve) hours as needed for cough.  Marland Kitchen glipiZIDE (GLUCOTROL XL) 10 MG 24 hr tablet Take by mouth.  Marland Kitchen lisinopril (ZESTRIL) 40 MG tablet TAKE 1 TABLET (40 MG TOTAL) BY MOUTH DAILY. (Patient not taking: Reported on 12/03/2020)  . nitroGLYCERIN (NITROSTAT) 0.4 MG SL tablet SMARTSIG:1 Tablet(s) Sublingual PRN (Patient not taking: No sig reported)  . SYMBICORT 80-4.5 MCG/ACT inhaler TAKE 2 PUFFS BY MOUTH TWICE A DAY  . tadalafil (CIALIS) 20 MG tablet TAKE ONE TABLET BY MOUTH EVERY THREE DAYS AS NEEDED FOR ERECTILE DYSFUNCTION (Patient not taking: Reported on 12/03/2020)  . TRULICITY 1.47 WL/2.9VF SOPN SMARTSIG:0.5 Milliliter(s) SUB-Q Once a Week (Patient not taking: No sig reported)   No facility-administered medications prior to visit.    Review of Systems  Constitutional: Negative for appetite change, chills and fever.  Respiratory: Negative for chest tightness, shortness of breath and wheezing.   Cardiovascular: Negative for chest pain and palpitations.  Gastrointestinal: Negative for abdominal pain, nausea and vomiting.        Objective    BP (!) 160/90   Pulse 75   Temp 98.5 F (36.9 C)   Resp 16   Ht 5' 10"  (1.778 m)   Wt 233 lb 9.6 oz (106 kg)   BMI 33.52 kg/m  BP Readings from Last 3 Encounters:  12/03/20 (!) 160/90  11/09/20 (!) 135/96  09/20/20 133/84   Wt Readings from Last 3 Encounters:   12/03/20 233 lb 9.6 oz (106 kg)  10/02/20 232 lb (105.2 kg)  09/20/20 228 lb (103.4 kg)       Physical Exam Vitals reviewed.  Constitutional:      Appearance: He is well-developed.  HENT:     Head: Normocephalic and atraumatic.     Right Ear: External ear normal.     Left Ear: External ear normal.     Nose: Nose normal.  Eyes:     Conjunctiva/sclera: Conjunctivae normal.     Pupils: Pupils are equal, round, and reactive to light.  Cardiovascular:     Rate and Rhythm: Normal rate and regular rhythm.     Heart sounds: Normal heart sounds.     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 Pulmonary:     Effort: Pulmonary effort is normal.     Breath sounds: Normal breath sounds.  Abdominal:     General: Bowel sounds are normal.     Palpations: Abdomen is soft.  Genitourinary:    Penis: Normal.      Prostate: Normal.     Rectum: Normal.  Musculoskeletal:     Cervical back: Normal range of motion and neck supple.     Right lower leg: No edema.     Left lower leg: No edema.  Skin:    General: Skin is warm and dry.  Neurological:     General: No focal deficit present.     Mental Status: He is alert and oriented to person, place, and time.     Deep Tendon Reflexes: Reflexes are normal and symmetric.  Psychiatric:        Mood and Affect: Mood normal.        Behavior: Behavior normal.        Thought Content: Thought content normal.        Judgment: Judgment normal.       Results for orders placed or performed in visit on 12/03/20  POCT glycosylated hemoglobin (Hb A1C)  Result Value Ref Range   Hemoglobin A1C 5.9 (A) 4.0 - 5.6 %   HbA1c POC (<> result, manual entry)     HbA1c, POC (prediabetic range)     HbA1c, POC (controlled diabetic range)      Assessment & Plan     1. Diabetes mellitus without complication (HCC) Excellent control at 5.9.  Stop glipizide. - POCT glycosylated hemoglobin (Hb A1C)  2. S/P CABG x 3 All risk factors  treated.  3. Essential (primary) hypertension  4. Hyperlipidemia associated with type 2 diabetes mellitus (HCC) Atorvastatin 40   No follow-ups on file.      I, [name], MD, have reviewed all documentation for this visit. The documentation on 12/16/20 for the exam, diagnosis, procedures, and orders are all accurate and complete.    Anani Gu  Cranford Mon, MD  [facility] [phone] (phone) [phone] (fax)  [place]

## 2020-12-03 ENCOUNTER — Ambulatory Visit (INDEPENDENT_AMBULATORY_CARE_PROVIDER_SITE_OTHER): Payer: 59 | Admitting: Family Medicine

## 2020-12-03 ENCOUNTER — Encounter: Payer: Self-pay | Admitting: Family Medicine

## 2020-12-03 ENCOUNTER — Other Ambulatory Visit: Payer: Self-pay

## 2020-12-03 VITALS — BP 160/90 | HR 75 | Temp 98.5°F | Resp 16 | Ht 70.0 in | Wt 233.6 lb

## 2020-12-03 DIAGNOSIS — Z951 Presence of aortocoronary bypass graft: Secondary | ICD-10-CM

## 2020-12-03 DIAGNOSIS — I10 Essential (primary) hypertension: Secondary | ICD-10-CM | POA: Diagnosis not present

## 2020-12-03 DIAGNOSIS — E119 Type 2 diabetes mellitus without complications: Secondary | ICD-10-CM

## 2020-12-03 DIAGNOSIS — E1169 Type 2 diabetes mellitus with other specified complication: Secondary | ICD-10-CM

## 2020-12-03 DIAGNOSIS — E785 Hyperlipidemia, unspecified: Secondary | ICD-10-CM

## 2020-12-03 LAB — POCT GLYCOSYLATED HEMOGLOBIN (HGB A1C): Hemoglobin A1C: 5.9 % — AB (ref 4.0–5.6)

## 2020-12-13 ENCOUNTER — Encounter: Payer: Self-pay | Admitting: Family Medicine

## 2020-12-13 ENCOUNTER — Other Ambulatory Visit: Payer: Self-pay | Admitting: Family Medicine

## 2020-12-13 DIAGNOSIS — I10 Essential (primary) hypertension: Secondary | ICD-10-CM

## 2020-12-13 NOTE — Progress Notes (Signed)
Communication done.

## 2020-12-18 ENCOUNTER — Encounter: Payer: Self-pay | Admitting: Licensed Clinical Social Worker

## 2020-12-18 ENCOUNTER — Ambulatory Visit: Payer: Self-pay | Admitting: Licensed Clinical Social Worker

## 2020-12-18 DIAGNOSIS — Z1379 Encounter for other screening for genetic and chromosomal anomalies: Secondary | ICD-10-CM | POA: Insufficient documentation

## 2020-12-18 DIAGNOSIS — Z86010 Personal history of colonic polyps: Secondary | ICD-10-CM

## 2020-12-18 DIAGNOSIS — Z809 Family history of malignant neoplasm, unspecified: Secondary | ICD-10-CM

## 2020-12-18 NOTE — Progress Notes (Signed)
HPI:  [name] was previously seen in the [place] [facility] clinic due to a personal history of colon polyps and family history of cancer and concerns regarding a hereditary predisposition to cancer. Please refer to our prior cancer genetics clinic note for more information regarding our discussion, assessment and recommendations, at the time. [name] recent genetic test results were disclosed to him, as were recommendations warranted by these results. These results and recommendations are discussed in more detail below.  [name] is a 65 y.o. male with no personal history of cancer.  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  CANCER HISTORY:  Oncology History   No history exists.    FAMILY HISTORY:  We obtained a detailed, 4-generation family history.  Significant diagnoses are listed below: Family History  Problem Relation Age of Onset  . Diabetes Mother   . Seizures Father   . Heart attack Maternal Grandmother   . Cancer Maternal Grandfather        unk type   [name] has 2 daughters, no cancers. He does not have siblings.  [name] mother is living at 69 with Reginald Murphy, no history of cancer. She did not have siblings. Maternal grandfather had cancer, unknown type.  [name]'s father died at 11 due to heart issues. He had 10 siblings, none had cancer that patient is aware of. A paternal cousin did have part of her colon removed and gets colonoscopies every year, unsure if she has had genetic testing. No information about paternal grandparents.   [name] is unaware of previous family history of genetic testing for hereditary cancer risks. Patient's maternal ancestors are of [place] and [place] descent, and paternal ancestors are of [place] descent. There is no reported Ashkenazi Jewish ancestry. There is no known consanguinity.  GENETIC TEST RESULTS: Genetic testing reported out on 12/17/2020  through the [employer] CancerNext-Expanded+RNA cancer panel found no pathogenic mutations.   The CancerNext-Expanded + RNAinsight gene panel offered by [employer] and includes sequencing and rearrangement analysis for the following 77 genes: IP, ALK, APC*, ATM*, AXIN2, BAP1, BARD1, BLM, BMPR1A, BRCA1*, BRCA2*, BRIP1*, CDC73, CDH1*,CDK4, CDKN1B, CDKN2A, CHEK2*, CTNNA1, DICER1, FANCC, FH, FLCN, GALNT12, KIF1B, LZTR1, MAX, MEN1, MET, MLH1*, MSH2*, MSH3, MSH6*, MUTYH*, NBN, NF1*, NF2, NTHL1, PALB2*, PHOX2B, PMS2*, POT1, PRKAR1A, PTCH1, PTEN*, RAD51C*, RAD51D*,RB1, RECQL, RET, SDHA, SDHAF2, SDHB, SDHC, SDHD, SMAD4, SMARCA4, SMARCB1, SMARCE1, STK11, SUFU, TMEM127, TP53*,TSC1, TSC2, VHL and XRCC2 (sequencing and deletion/duplication); EGFR, EGLN1, HOXB13, KIT, MITF, PDGFRA, POLD1 and POLE (sequencing only); EPCAM and GREM1 (deletion/duplication only).   The test report has been scanned into EPIC and is located under the Molecular Pathology section of the Results Review tab.  A portion of the result report is included below for reference.     We discussed with [name] that because current genetic testing is not perfect, it is possible there may be a gene mutation in one of these genes that current testing cannot detect, but that chance is small.  We also discussed, that there could be another gene that has not yet been discovered, or that we have not yet tested, that is responsible for the cancer diagnoses in the family. It is also possible there is a hereditary cause for the cancer in the family that [name] did not inherit and therefore was not identified in his testing.  Therefore, it is important to remain in touch with cancer genetics in  the future so that we can continue to offer [name] the most up to date genetic testing.   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  At this time, it is unknown if this variant is associated with increased  cancer risk or if this is a normal finding, but most variants such as this get reclassified to being inconsequential. It should not be used to make medical management decisions. With time, we suspect the lab will determine the significance of this variant, if any. If we do learn more about it, we will try to contact [name] to discuss it further. However, it is important to stay in touch with [place] periodically and keep the address and phone number up to date.  ADDITIONAL GENETIC TESTING: We discussed with [name] that his genetic testing was fairly extensive.  If there are genes identified to increase cancer risk that can be analyzed in the future, we would be happy to discuss and coordinate this testing at that time.    CANCER SCREENING RECOMMENDATIONS: [name] test result is considered negative (normal).  This means that we have not identified a hereditary cause for his  family history of cancer at this time. *  While reassuring, this does not definitively rule out a hereditary predisposition to cancer. It is still possible that there could be genetic mutations that are undetectable by current technology. There could be genetic mutations in genes that have not been tested or identified to increase cancer risk.  Therefore, it is recommended he continue to follow the cancer management and screening guidelines provided by his primary healthcare provider.   An individual's cancer risk and medical management are not determined by genetic test results alone. Overall cancer risk assessment incorporates additional factors, including personal medical history, family history, and any available genetic information that may result in a personalized plan for cancer prevention and surveillance.  This negative genetic test simply tells [place] that we cannot yet define why [name] has had  an increased number of colorectal polyps.[name]'s medical management and screening should be based on the prospect that he will  likely form more colon polyps   and should, therefore, undergo more frequent colonoscopy screening at intervals determined by his GI providers.  We also recommended that [name] have an upper endoscopy periodically.  RECOMMENDATIONS FOR FAMILY MEMBERS:  Relatives in this family might be at some increased risk of developing cancer, over the general population risk, simply due to the family history of cancer.  We recommended male relatives in this family have a yearly mammogram beginning at age 58, or 4 years younger than the earliest onset of cancer, an annual clinical breast exam, and perform monthly breast self-exams. Male relatives in this family should also have a gynecological exam as recommended by their primary provider.  All family members should be referred for colonoscopy starting at age 8.   FOLLOW-UP: Lastly, we discussed with [name] that cancer genetics is a rapidly advancing field and it is possible that new genetic tests will be appropriate for him and/or his family members in the future. We encouraged him to remain in contact with cancer genetics on an annual basis so we can update his personal and family histories and let him know of advances in cancer genetics that may benefit this family.   Our contact number was provided. [name] questions were answered to his satisfaction, and he knows he is welcome  to call us at anytime with additional questions or concerns.   Faith Rogue, MS, [facility] Genetic Counselor [email or web address] Phone: [phone]

## 2020-12-29 ENCOUNTER — Other Ambulatory Visit: Payer: Self-pay | Admitting: Family Medicine

## 2020-12-29 DIAGNOSIS — I10 Essential (primary) hypertension: Secondary | ICD-10-CM

## 2020-12-29 DIAGNOSIS — E669 Obesity, unspecified: Secondary | ICD-10-CM

## 2020-12-29 DIAGNOSIS — E1169 Type 2 diabetes mellitus with other specified complication: Secondary | ICD-10-CM

## 2020-12-29 NOTE — Telephone Encounter (Signed)
Requested Prescriptions  Pending Prescriptions Disp Refills  . lisinopril (ZESTRIL) 40 MG tablet [Pharmacy Med Name: LISINOPRIL 40 MG TABLET] 90 tablet 0    Sig: TAKE 1 TABLET BY MOUTH EVERY DAY     Cardiovascular:  ACE Inhibitors Failed - 12/29/2020 12:33 PM      Failed - Last BP in normal range    BP Readings from Last 1 Encounters:  12/03/20 (!) 160/90         Passed - Cr in normal range and within 180 days    Creatinine, Ser  Date Value Ref Range Status  08/07/2020 1.03 0.76 - 1.27 mg/dL Final         Passed - K in normal range and within 180 days    Potassium  Date Value Ref Range Status  08/07/2020 4.6 3.5 - 5.2 mmol/L Final         Passed - Patient is not pregnant      Passed - Valid encounter within last 6 months    Recent Outpatient Visits          3 weeks ago Diabetes mellitus without complication [facility])   [facility] Jerrol Banana., MD   4 months ago Annual physical exam   [facility] Jerrol Banana., MD   8 months ago Diabetes mellitus without complication [facility])   [facility] Jerrol Banana., MD   1 year ago Diabetes mellitus without complication [facility])   [facility] Jerrol Banana., MD   1 year ago Diabetes mellitus without complication [facility])   [facility] Jerrol Banana., MD      Future Appointments            In 2 months Jerrol Banana., MD [facility], [facility]

## 2021-01-01 ENCOUNTER — Other Ambulatory Visit: Payer: Self-pay | Admitting: Family Medicine

## 2021-01-01 NOTE — Telephone Encounter (Signed)
Requested Prescriptions  Pending Prescriptions Disp Refills  . metFORMIN (GLUCOPHAGE) 1000 MG tablet [Pharmacy Med Name: METFORMIN HCL 1,000 MG TABLET] 180 tablet 0    Sig: TAKE 1 TABLET BY MOUTH 2 TIMES DAILY WITH A MEAL.     Endocrinology:  Diabetes - Biguanides Passed - 01/01/2021  1:20 AM      Passed - Cr in normal range and within 360 days    Creatinine, Ser  Date Value Ref Range Status  08/07/2020 1.03 0.76 - 1.27 mg/dL Final         Passed - HBA1C is between 0 and 7.9 and within 180 days    Hemoglobin A1C  Date Value Ref Range Status  12/03/2020 5.9 (A) 4.0 - 5.6 % Final   Hgb A1c MFr Bld  Date Value Ref Range Status  08/07/2020 6.2 (H) 4.8 - 5.6 % Final    Comment:             Prediabetes: 5.7 - 6.4          Diabetes: >6.4          Glycemic control for adults with diabetes: <7.0          Passed - eGFR in normal range and within 360 days    GFR calc Af Amer  Date Value Ref Range Status  08/07/2020 90 >59 mL/min/1.73 Final    Comment:    **In accordance with recommendations from the NKF-ASN Task force,**   [facility] is in the process of updating its eGFR calculation to the   2021 CKD-EPI creatinine equation that estimates kidney function   without a race variable.    GFR calc non Af Amer  Date Value Ref Range Status  08/07/2020 77 >59 mL/min/1.73 Final         Passed - Valid encounter within last 6 months    Recent Outpatient Visits          4 weeks ago Diabetes mellitus without complication (HCC)   [facility] Gilbert, Richard L Jr., MD   4 months ago Annual physical exam   [facility] Gilbert, Richard L Jr., MD   8 months ago Diabetes mellitus without complication (HCC)   [facility] Gilbert, Richard L Jr., MD   1 year ago Diabetes mellitus without complication (HCC)   [facility] Gilbert, Richard L Jr., MD   1 year ago Diabetes mellitus without complication (HCC)   [facility]  Gilbert, Richard L Jr., MD      Future Appointments            In 2 months Gilbert, Richard L Jr., MD  [facility], [facility]            

## 2021-03-11 ENCOUNTER — Other Ambulatory Visit: Payer: Self-pay | Admitting: Family Medicine

## 2021-03-11 DIAGNOSIS — I10 Essential (primary) hypertension: Secondary | ICD-10-CM

## 2021-03-18 ENCOUNTER — Ambulatory Visit (INDEPENDENT_AMBULATORY_CARE_PROVIDER_SITE_OTHER): Payer: 59 | Admitting: Family Medicine

## 2021-03-18 ENCOUNTER — Other Ambulatory Visit: Payer: Self-pay

## 2021-03-18 ENCOUNTER — Encounter: Payer: Self-pay | Admitting: Family Medicine

## 2021-03-18 VITALS — BP 149/90 | HR 68 | Temp 98.6°F | Resp 18 | Ht 70.0 in | Wt 238.0 lb

## 2021-03-18 DIAGNOSIS — E1169 Type 2 diabetes mellitus with other specified complication: Secondary | ICD-10-CM | POA: Diagnosis not present

## 2021-03-18 DIAGNOSIS — E119 Type 2 diabetes mellitus without complications: Secondary | ICD-10-CM

## 2021-03-18 DIAGNOSIS — I2510 Atherosclerotic heart disease of native coronary artery without angina pectoris: Secondary | ICD-10-CM

## 2021-03-18 DIAGNOSIS — I10 Essential (primary) hypertension: Secondary | ICD-10-CM | POA: Diagnosis not present

## 2021-03-18 DIAGNOSIS — I2583 Coronary atherosclerosis due to lipid rich plaque: Secondary | ICD-10-CM

## 2021-03-18 DIAGNOSIS — E785 Hyperlipidemia, unspecified: Secondary | ICD-10-CM

## 2021-03-18 NOTE — Progress Notes (Signed)
I,[name],acting as a scribe for [name], MD.,have documented all relevant documentation on the behalf of [name], MD,as directed by  [name], MD while in the presence of [name], MD.  Established patient visit   Patient: Reginald Murphy   DOB: 02-20-1956   65 y.o. Male  MRN: [id number] Visit Date: 03/18/2021  Today's healthcare provider: [name], MD   Chief Complaint  Patient presents with   Follow-up   Diabetes   Hypertension   Hyperlipidemia   Subjective    HPI  Patient feels well and has no complaints.  He is taking medications and has no issues. Diabetes Mellitus Type II, follow-up  Lab Results  Component Value Date   HGBA1C 5.9 (A) 12/03/2020   HGBA1C 6.2 (H) 08/07/2020   HGBA1C 5.9 (A) 05/23/2020   Last seen for diabetes 4 months ago.  Management since then includes continuing the same treatment. He reports good compliance with treatment. He is not having side effects. none  Home blood sugar records: fasting range: not checking  Episodes of hypoglycemia? No none   Current insulin regiment: n/a Most Recent Eye Exam: due  ----------------------------------------------------------------------------  Hypertension, follow-up  BP Readings from Last 3 Encounters:  03/18/21 (!) 149/90  12/03/20 (!) 160/90  11/09/20 (!) 135/96   Wt Readings from Last 3 Encounters:  03/18/21 238 lb (108 kg)  12/03/20 233 lb 9.6 oz (106 kg)  10/02/20 232 lb (105.2 kg)     He was last seen for hypertension 4 months ago.  BP at that visit was 160/90. Management since that visit includes no medication changes. Continue to monitor BP at home.  He reports good compliance with treatment. He is not having side effects. none He is exercising. He is not adherent to low salt diet.   Outside blood pressures are not checking.  He does not smoke.  Use of agents associated with hypertension: none.    ----------------------------------------------------------------------------  Lipid/Cholesterol, follow-up  Last Lipid Panel: Lab Results  Component Value Date   CHOL 102 08/07/2020   LDLCALC 40 08/07/2020   HDL 31 (L) 08/07/2020   TRIG 188 (H) 08/07/2020    He was last seen for this 4 months ago.  Management since that visit includes no medication changes. On Atorvastatin 92m  He reports good compliance with treatment. He is not having side effects. none  He is following a Regular diet. Current exercise: walking  Last metabolic panel Lab Results  Component Value Date   GLUCOSE 108 (H) 08/07/2020   NA 139 08/07/2020   K 4.6 08/07/2020   BUN 16 08/07/2020   CREATININE 1.03 08/07/2020   GFRNONAA 77 08/07/2020   GFRAA 90 08/07/2020   CALCIUM 9.9 08/07/2020   AST 19 08/07/2020   ALT 29 08/07/2020   The ASCVD Risk score (Goff DC Jr., et al., 2013) failed to calculate for the following reasons:   The valid total cholesterol range is 130 to 320 mg/dL  ----------------------------------------------------------------------------       Medications: Outpatient Medications Prior to Visit  Medication Sig   ACCU-CHEK GUIDE test strip Please specify directions, refills and quantity   aspirin EC 81 MG tablet Take 81 mg by mouth daily.    atorvastatin (LIPITOR) 40 MG tablet Take 40 mg by mouth at bedtime.   Blood Glucose Monitoring Suppl (ACCU-CHEK NANO SMARTVIEW) w/Device KIT 1 strip by Does not apply route daily.   glipiZIDE (GLUCOTROL XL) 10 MG  24 hr tablet Take by mouth.   glucose blood test strip 1 each by Other route as needed for other. Use as instructed   metFORMIN (GLUCOPHAGE) 1000 MG tablet TAKE 1 TABLET BY MOUTH 2 TIMES DAILY WITH A MEAL.   metoprolol tartrate (LOPRESSOR) 25 MG tablet Take by mouth.   nitroGLYCERIN (NITROSTAT) 0.4 MG SL tablet SMARTSIG:1 Tablet(s) Sublingual PRN   ONETOUCH DELICA LANCETS 81K MISC 2 (two) times daily. for testing   tadalafil  (CIALIS) 20 MG tablet TAKE ONE TABLET BY MOUTH EVERY THREE DAYS AS NEEDED FOR ERECTILE DYSFUNCTION   TRULICITY 4.81 EH/6.3JS SOPN SMARTSIG:0.5 Milliliter(s) SUB-Q Once a Week   albuterol (VENTOLIN HFA) 108 (90 Base) MCG/ACT inhaler TAKE 2 PUFFS BY MOUTH EVERY 6 HOURS AS NEEDED FOR WHEEZE OR SHORTNESS OF BREATH (Patient not taking: Reported on 03/18/2021)   amLODipine (NORVASC) 5 MG tablet TAKE 1 TABLET BY MOUTH EVERY DAY (Patient not taking: Reported on 03/18/2021)   chlorpheniramine-HYDROcodone (TUSSIONEX PENNKINETIC ER) 10-8 MG/5ML SUER Take 5 mLs by mouth every 12 (twelve) hours as needed for cough. (Patient not taking: Reported on 03/18/2021)   lisinopril (ZESTRIL) 40 MG tablet TAKE 1 TABLET BY MOUTH EVERY DAY (Patient not taking: Reported on 03/18/2021)   SYMBICORT 80-4.5 MCG/ACT inhaler TAKE 2 PUFFS BY MOUTH TWICE A DAY (Patient not taking: Reported on 03/18/2021)   No facility-administered medications prior to visit.    Review of Systems  Constitutional:  Negative for activity change and fatigue.  Respiratory:  Negative for cough and shortness of breath.   Cardiovascular:  Negative for chest pain, palpitations and leg swelling.  Endocrine: Negative for cold intolerance, heat intolerance, polydipsia, polyphagia and polyuria.  Musculoskeletal:  Negative for arthralgias and myalgias.  Neurological:  Negative for dizziness, light-headedness and headaches.  Psychiatric/Behavioral:  Negative for agitation, self-injury, sleep disturbance and suicidal ideas. The patient is not nervous/anxious.        Objective    BP (!) 149/90 (BP Location: Right Arm, Patient Position: Sitting, Cuff Size: Large)   Pulse 68   Temp 98.6 F (37 C) (Oral)   Resp 18   Ht 5' 10"  (1.778 m)   Wt 238 lb (108 kg)   SpO2 97%   BMI 34.15 kg/m  BP Readings from Last 3 Encounters:  03/18/21 (!) 149/90  12/03/20 (!) 160/90  11/09/20 (!) 135/96   Wt Readings from Last 3 Encounters:  03/18/21 238 lb (108 kg)   12/03/20 233 lb 9.6 oz (106 kg)  10/02/20 232 lb (105.2 kg)       Physical Exam Vitals reviewed.  Constitutional:      Appearance: He is well-developed.  HENT:     Head: Normocephalic and atraumatic.     Right Ear: External ear normal.     Left Ear: External ear normal.     Nose: Nose normal.  Eyes:     Conjunctiva/sclera: Conjunctivae normal.     Pupils: Pupils are equal, round, and reactive to light.  Cardiovascular:     Rate and Rhythm: Normal rate and regular rhythm.     Heart sounds: Normal heart sounds.     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 Pulmonary:     Effort: Pulmonary effort is normal.     Breath sounds: Normal breath sounds.  Abdominal:     General: Bowel sounds are normal.     Palpations: Abdomen is soft.  Genitourinary:    Penis: Normal.      Prostate: Normal.  Rectum: Normal.  Musculoskeletal:     Cervical back: Normal range of motion and neck supple.     Right lower leg: No edema.     Left lower leg: No edema.  Skin:    General: Skin is warm and dry.  Neurological:     General: No focal deficit present.     Mental Status: He is alert and oriented to person, place, and time.     Deep Tendon Reflexes: Reflexes are normal and symmetric.  Psychiatric:        Mood and Affect: Mood normal.        Behavior: Behavior normal.        Thought Content: Thought content normal.        Judgment: Judgment normal.      No results found for any visits on 03/18/21.  Assessment & Plan     1. Diabetes mellitus without complication (HCC)  - Hemoglobin A1c - Lipid panel - TSH - CBC w/Diff/Platelet - Comprehensive Metabolic Panel (CMET)  2. Essential (primary) hypertension  - Hemoglobin A1c - Lipid panel - TSH - CBC w/Diff/Platelet - Comprehensive Metabolic Panel (CMET)  3. Hyperlipidemia associated with type 2 diabetes mellitus (HCC)  - Hemoglobin A1c - Lipid panel - TSH - CBC w/Diff/Platelet - Comprehensive Metabolic  Panel (CMET)  4. Coronary artery disease due to lipid rich plaque All risk factors treated. - Hemoglobin A1c - Lipid panel - TSH - CBC w/Diff/Platelet - Comprehensive Metabolic Panel (CMET)   Return in about 4 months (around 07/19/2021).      I, [name], MD, have reviewed all documentation for this visit. The documentation on 03/22/21 for the exam, diagnosis, procedures, and orders are all accurate and complete.    Simya Tercero Cranford Mon, MD  [facility] [phone] (phone) [phone] (fax)  [place]

## 2021-03-19 LAB — COMPREHENSIVE METABOLIC PANEL
ALT: 23 IU/L (ref 0–44)
AST: 15 IU/L (ref 0–40)
Albumin/Globulin Ratio: 2 (ref 1.2–2.2)
Albumin: 4.7 g/dL (ref 3.8–4.8)
Alkaline Phosphatase: 79 IU/L (ref 44–121)
BUN/Creatinine Ratio: 14 (ref 10–24)
BUN: 16 mg/dL (ref 8–27)
Bilirubin Total: 0.7 mg/dL (ref 0.0–1.2)
CO2: 20 mmol/L (ref 20–29)
Calcium: 9.3 mg/dL (ref 8.6–10.2)
Chloride: 104 mmol/L (ref 96–106)
Creatinine, Ser: 1.16 mg/dL (ref 0.76–1.27)
Globulin, Total: 2.4 g/dL (ref 1.5–4.5)
Glucose: 104 mg/dL — ABNORMAL HIGH (ref 65–99)
Potassium: 4.6 mmol/L (ref 3.5–5.2)
Sodium: 141 mmol/L (ref 134–144)
Total Protein: 7.1 g/dL (ref 6.0–8.5)
eGFR: 71 mL/min/{1.73_m2} (ref >59)

## 2021-03-19 LAB — CBC WITH DIFFERENTIAL/PLATELET
Basophils Absolute: 0 10*3/uL (ref 0.0–0.2)
Basos: 1 %
EOS (ABSOLUTE): 0.3 10*3/uL (ref 0.0–0.4)
Eos: 4 %
Hematocrit: 43.5 % (ref 37.5–51.0)
Hemoglobin: 14.6 g/dL (ref 13.0–17.7)
Immature Grans (Abs): 0 10*3/uL (ref 0.0–0.1)
Immature Granulocytes: 0 %
Lymphocytes Absolute: 2.4 10*3/uL (ref 0.7–3.1)
Lymphs: 34 %
MCH: 29.1 pg (ref 26.6–33.0)
MCHC: 33.6 g/dL (ref 31.5–35.7)
MCV: 87 fL (ref 79–97)
Monocytes Absolute: 0.6 10*3/uL (ref 0.1–0.9)
Monocytes: 8 %
Neutrophils Absolute: 3.9 10*3/uL (ref 1.4–7.0)
Neutrophils: 53 %
Platelets: 184 10*3/uL (ref 150–450)
RBC: 5.01 x10E6/uL (ref 4.14–5.80)
RDW: 12.1 % (ref 11.6–15.4)
WBC: 7.3 10*3/uL (ref 3.4–10.8)

## 2021-03-19 LAB — LIPID PANEL
Chol/HDL Ratio: 3.4 ratio (ref 0.0–5.0)
Cholesterol, Total: 95 mg/dL — ABNORMAL LOW (ref 100–199)
HDL: 28 mg/dL — ABNORMAL LOW (ref >39)
LDL Chol Calc (NIH): 37 mg/dL (ref 0–99)
Triglycerides: 180 mg/dL — ABNORMAL HIGH (ref 0–149)
VLDL Cholesterol Cal: 30 mg/dL (ref 5–40)

## 2021-03-19 LAB — HEMOGLOBIN A1C
Est. average glucose Bld gHb Est-mCnc: 134 mg/dL
Hgb A1c MFr Bld: 6.3 % — ABNORMAL HIGH (ref 4.8–5.6)

## 2021-03-19 LAB — TSH: TSH: 3.6 u[IU]/mL (ref 0.450–4.500)

## 2021-04-03 ENCOUNTER — Other Ambulatory Visit: Payer: Self-pay | Admitting: Family Medicine

## 2021-04-06 ENCOUNTER — Other Ambulatory Visit: Payer: Self-pay | Admitting: Family Medicine

## 2021-04-06 DIAGNOSIS — E669 Obesity, unspecified: Secondary | ICD-10-CM

## 2021-04-06 DIAGNOSIS — E1169 Type 2 diabetes mellitus with other specified complication: Secondary | ICD-10-CM

## 2021-04-06 DIAGNOSIS — I10 Essential (primary) hypertension: Secondary | ICD-10-CM

## 2021-04-06 NOTE — Telephone Encounter (Signed)
Requested Prescriptions  Pending Prescriptions Disp Refills  . lisinopril (ZESTRIL) 40 MG tablet [Pharmacy Med Name: LISINOPRIL 40 MG TABLET] 90 tablet 0    Sig: TAKE 1 TABLET BY MOUTH EVERY DAY     Cardiovascular:  ACE Inhibitors Failed - 04/06/2021 10:05 AM      Failed - Last BP in normal range    BP Readings from Last 1 Encounters:  03/18/21 (!) 149/90         Passed - Cr in normal range and within 180 days    Creatinine, Ser  Date Value Ref Range Status  03/18/2021 1.16 0.76 - 1.27 mg/dL Final         Passed - K in normal range and within 180 days    Potassium  Date Value Ref Range Status  03/18/2021 4.6 3.5 - 5.2 mmol/L Final         Passed - Patient is not pregnant      Passed - Valid encounter within last 6 months    Recent Outpatient Visits          2 weeks ago Diabetes mellitus without complication [facility])   [facility] Jerrol Banana[facility]   4 months ago Diabetes mellitus without complication [facility])   [facility] Jerrol Banana., MD   8 months ago Annual physical exam   [facility]rol Ban[facility] MD   11 months ago Diabetes mellitus without complication [facility])   [facility]   1 year ago Diabetes mellitus without complication [facility])   [facility] Jerrol Banana., MD      Future Appointments            In 4 months Jerrol Banana., MD [facility]

## 2021-07-05 ENCOUNTER — Other Ambulatory Visit: Payer: Self-pay | Admitting: Family Medicine

## 2021-07-05 DIAGNOSIS — E1169 Type 2 diabetes mellitus with other specified complication: Secondary | ICD-10-CM

## 2021-07-05 DIAGNOSIS — I10 Essential (primary) hypertension: Secondary | ICD-10-CM

## 2021-07-05 DIAGNOSIS — E669 Obesity, unspecified: Secondary | ICD-10-CM

## 2021-07-05 NOTE — Telephone Encounter (Signed)
Requested Prescriptions  Pending Prescriptions Disp Refills  . lisinopril (ZESTRIL) 40 MG tablet [Pharmacy Med Name: LISINOPRIL 40 MG TABLET] 90 tablet 0    Sig: TAKE 1 TABLET BY MOUTH EVERY DAY     Cardiovascular:  ACE Inhibitors Failed - 07/05/2021  1:32 AM      Failed - Last BP in normal range    BP Readings from Last 1 Encounters:  03/18/21 (!) 149/90         Passed - Cr in normal range and within 180 days    Creatinine, Ser  Date Value Ref Range Status  03/18/2021 1.16 0.76 - 1.27 mg/dL Final         Passed - K in normal range and within 180 days    Potassium  Date Value Ref Range Status  03/18/2021 4.6 3.5 - 5.2 mmol/L Final         Passed - Patient is not pregnant      Passed - Valid encounter within last 6 months    Recent Outpatient Visits          3 months ago Diabetes mellitus without complication [facility])   [facility] Jerrol Banana., MD   7 months ago Diabetes mellitus without complication [facility])   [facility] MD   11 months ago Annual physical exam   [facility] Jerrol Banana., MD   1 year ago Diabetes mellitus without complication [facility])   [facility] Jer[facility] Banana[facility] MD   1 year ago Diabetes mellitus without complication [facility])   [facility] Jerrol Banana., MD      Future Appointments            In 1 month Jerrol Banana., MD [facility], [facility]

## 2021-07-06 ENCOUNTER — Other Ambulatory Visit: Payer: Self-pay | Admitting: Family Medicine

## 2021-07-06 DIAGNOSIS — E119 Type 2 diabetes mellitus without complications: Secondary | ICD-10-CM

## 2021-07-06 NOTE — Telephone Encounter (Signed)
Requested Prescriptions  Pending Prescriptions Disp Refills  . Dulaglutide (TRULICITY) 9.63 YO/3.7CH SOPN [Pharmacy Med Name: TRULICITY 8.85 OY/7.7 ML PEN] 6 mL 7    Sig: INJECT 0.5 MILLILITERS SUB-Q ONCE A WEEK     Endocrinology:  Diabetes - GLP-1 Receptor Agonists Passed - 07/06/2021 11:04 AM      Passed - HBA1C is between 0 and 7.9 and within 180 days    Hgb A1c MFr Bld  Date Value Ref Range Status  03/18/2021 6.3 (H) 4.8 - 5.6 % Final    Comment:             Prediabetes: 5.7 - 6.4          Diabetes: >6.4          Glycemic control for adults with diabetes: <7.0          Passed - Valid encounter within last 6 months    Recent Outpatient Visits          3 months ago Diabetes mellitus without complication [facility])   [facility] Jerrol Ban[facility]., MD   7 months ago Diabetes mellitus without complication [facility])   [facility] Jerrol Banana., MD   11 months ago Annual physical exam   [facility] MD   1 year ago Diabetes mellitus without complication [facility])   [facility] Jerrol Banana., MD   1 year ago Diabetes mellitus without complication [facility])   [facility] Jerrol Banana., MD      Future Appointments            In 1 month Jerrol Banana., MD [facility], [facility]

## 2021-08-08 ENCOUNTER — Encounter: Payer: Self-pay | Admitting: Family Medicine

## 2021-09-27 ENCOUNTER — Other Ambulatory Visit: Payer: Self-pay | Admitting: Family Medicine

## 2021-09-27 DIAGNOSIS — E1169 Type 2 diabetes mellitus with other specified complication: Secondary | ICD-10-CM

## 2021-09-27 DIAGNOSIS — E669 Obesity, unspecified: Secondary | ICD-10-CM

## 2021-09-27 DIAGNOSIS — I10 Essential (primary) hypertension: Secondary | ICD-10-CM

## 2021-09-27 NOTE — Telephone Encounter (Signed)
Requested Prescriptions  Pending Prescriptions Disp Refills   lisinopril (ZESTRIL) 40 MG tablet [Pharmacy Med Name: LISINOPRIL 40 MG TABLET] 90 tablet 0    Sig: TAKE 1 TABLET BY MOUTH EVERY DAY     Cardiovascular:  ACE Inhibitors Failed - 09/27/2021  4:47 PM      Failed - Cr in normal range and within 180 days    Creatinine, Ser  Date Value Ref Range Status  03/18/2021 1.16 0.76 - 1.27 mg/dL Final         Failed - K in normal range and within 180 days    Potassium  Date Value Ref Range Status  03/18/2021 4.6 3.5 - 5.2 mmol/L Final         Failed - Last BP in normal range    BP Readings from Last 1 Encounters:  03/18/21 (!) 149/90         Failed - Valid encounter within last 6 months    Recent Outpatient Visits          6 months ago Diabetes mellitus without complication [facility])   [facility] Jerrol Banana., MD   9 months ago Diabetes mellitus without complication [facility])   [facility]   1 year ago Annual physical exam   [facility] Jerrol Banana., MD   1 year ago Diabetes mellitus without complication [facility])   [facility] Jerrol Banana., MD   1 year ago Diabetes mellitus without complication [facility])   [facility] Jerrol Banana., MD      Future Appointments            In 1 month Jerrol Banana., MD [facility] [place] - Patient is not pregnant       metFORMIN (GLUCOPHAGE) 1000 MG tablet [Pharmacy Med Name: METFORMIN HCL 1,000 MG TABLET] 180 tablet 0    Sig: TAKE 1 TABLET BY MOUTH 2 TIMES DAILY WITH A MEAL.     Endocrinology:  Diabetes - Biguanides Failed - 09/27/2021  4:47 PM      Failed - HBA1C is between 0 and 7.9 and within 180 days    Hgb A1c MFr Bld  Date Value Ref Range Status  03/18/2021 6.3 (H) 4.8 - 5.6 % Final    Comment:             Prediabetes: 5.7 - 6.4          Diabetes: >6.4           Glycemic control for adults with diabetes: <7.0          Failed - Valid encounter within last 6 months    Recent Outpatient Visits          6 months ago Diabetes mellitus without complication [facility])   [facility] Jerrol Banana., MD   9 months ago Diabetes mellitus without complication [facility])   [facility] Jerrol Banana[facility] MD   1 year ago Annual physical exam   [facility] Jerrol Banana., MD   1 year ago Diabetes mellitus without complication [facility])   [facility] Jerrol Banana., MD   1 year ago Diabetes mellitus without complication [facility])   [facility] Jerrol Banana., MD      Future Appointments  In 1 month Jerrol Banana., MD [facility]           Passed - Cr in normal range and within 360 days    Creatinine, Ser  Date Value Ref Range Status  03/18/2021 1.16 0.76 - 1.27 mg/dL Final         Passed - eGFR in normal range and within 360 days    GFR calc Af Amer  Date Value Ref Range Status  08/07/2020 90 >59 mL/min/1.73 Final    Comment:    **In accordance with recommendations from the NKF-ASN Task force,**   [facility] is in the process of updating its eGFR calculation to the   2021 CKD-EPI creatinine equation that estimates kidney function   without a race variable.    GFR calc non Af Amer  Date Value Ref Range Status  08/07/2020 77 >59 mL/min/1.73 Final   eGFR  Date Value Ref Range Status  03/18/2021 71 >59 mL/min/1.73 Final          Pt was asked to return in 4 months, had appt, which was canceled by provider.

## 2021-10-03 ENCOUNTER — Other Ambulatory Visit: Payer: Self-pay | Admitting: Family Medicine

## 2021-10-03 DIAGNOSIS — E119 Type 2 diabetes mellitus without complications: Secondary | ICD-10-CM

## 2021-10-03 MED ORDER — TRULICITY 0.75 MG/0.5ML ~~LOC~~ SOAJ: SUBCUTANEOUS | 7 refills | Status: AC

## 2021-10-03 NOTE — Telephone Encounter (Signed)
Medication: Dulaglutide (TRULICITY) 7.01 XB/9.3JQ SOPN [[id number]] Patient is out of medication for this week.   Has the patient contacted their pharmacy? YES Patient states that the pharmacy sent a request. And there has been no response. (Agent: If no, request that the patient contact the pharmacy for the refill. If patient does not wish to contact the pharmacy document the reason why and proceed with request.) (Agent: If yes, when and what did the pharmacy advise?)  Preferred Pharmacy (with phone number or street name): [id number] [place] [address] [place] [address] Phone: [phone] Fax: [phone] Hours: Not open 24 hours   Has the patient been seen for an appointment in the last year OR does the patient have an upcoming appointment? YES 03/18/2021  Agent: Please be advised that RX refills may take up to 3 business days. We ask that you follow-up with your pharmacy.

## 2021-10-03 NOTE — Telephone Encounter (Signed)
Requested Prescriptions  Pending Prescriptions Disp Refills   Dulaglutide (TRULICITY) 8.10 FB/5.1WC SOPN 6 mL 7    Sig: INJECT 0.5 MILLILITERS SUB-Q ONCE A WEEK     Endocrinology:  Diabetes - GLP-1 Receptor Agonists Failed - 10/03/2021  1:35 PM      Failed - HBA1C is between 0 and 7.9 and within 180 days    Hgb A1c MFr Bld  Date Value Ref Range Status  03/18/2021 6.3 (H) 4.8 - 5.6 % Final    Comment:             Prediabetes: 5.7 - 6.4          Diabetes: >6.4          Glycemic control for adults with diabetes: <7.0          Failed - Valid encounter within last 6 months    Recent Outpatient Visits          6 months ago Diabetes mellitus without complication [facility])   [facility] Jerrol Banana., MD   10 months ago Diabetes mellitus without complication [facility])   [facility] Jerrol Banana., MD   1 year ago Annual physical exam   [facility] Jerrol Banana., MD   1 year ago Diabetes mellitus without complication [facility])   [facility] Jerrol Banana[facility] MD   1 year ago Diabetes mellitus without complication [facility])   [facility] Jerrol Banana., MD      Future Appointments            In 1 month Jerrol Banana., MD [facility]

## 2021-11-13 ENCOUNTER — Ambulatory Visit (INDEPENDENT_AMBULATORY_CARE_PROVIDER_SITE_OTHER): Payer: 59 | Admitting: Family Medicine

## 2021-11-13 ENCOUNTER — Encounter: Payer: Self-pay | Admitting: Family Medicine

## 2021-11-13 ENCOUNTER — Other Ambulatory Visit: Payer: Self-pay

## 2021-11-13 VITALS — BP 156/84 | HR 77 | Resp 16 | Ht 70.0 in | Wt 235.9 lb

## 2021-11-13 DIAGNOSIS — E1169 Type 2 diabetes mellitus with other specified complication: Secondary | ICD-10-CM

## 2021-11-13 DIAGNOSIS — E1159 Type 2 diabetes mellitus with other circulatory complications: Secondary | ICD-10-CM | POA: Diagnosis not present

## 2021-11-13 DIAGNOSIS — Z951 Presence of aortocoronary bypass graft: Secondary | ICD-10-CM | POA: Diagnosis not present

## 2021-11-13 DIAGNOSIS — E785 Hyperlipidemia, unspecified: Secondary | ICD-10-CM | POA: Diagnosis not present

## 2021-11-13 DIAGNOSIS — E119 Type 2 diabetes mellitus without complications: Secondary | ICD-10-CM | POA: Diagnosis not present

## 2021-11-13 DIAGNOSIS — I10 Essential (primary) hypertension: Secondary | ICD-10-CM

## 2021-11-13 LAB — POCT GLYCOSYLATED HEMOGLOBIN (HGB A1C)
Hemoglobin A1C: 6.6 % — AB (ref 4.0–5.6)
PT: 143

## 2021-11-13 NOTE — Progress Notes (Signed)
?  ? ? ?Established patient visit ? ? ?Patient: Reginald Murphy   DOB: 1956-09-08   66 y.o. Male  MRN: [id number] ?Visit Date: 11/13/2021 ? ?Today's healthcare provider: [name], MD  ? ? ?I,Joseline E Rosas,acting as a scribe for [name], MD.,have documented all relevant documentation on the behalf of [name], MD,as directed by  [name], MD while in the presence of [name], MD.  ? ?Chief Complaint  ?Patient presents with  ? Follow-up  ? ?Subjective  ?  ?HPI  ?Patient comes in today for follow-up.  He has had a long vacation in the [place]. ?Overall he is feeling well.  He has no complaints. ? ?Diabetes Mellitus Type II, follow-up ? ?Lab Results  ?Component Value Date  ? HGBA1C 6.6 (A) 11/13/2021  ? HGBA1C 6.3 (H) 03/18/2021  ? HGBA1C 5.9 (A) 12/03/2020  ? ?Last seen for diabetes 8 months ago.  ?Management since then includes continuing the same treatment. ?He reports excellent compliance with treatment. ?He is not having side effects.  ? ?Home blood sugar records:  not being checked ? ?Episodes of hypoglycemia? No  ? ?Most Recent Eye Exam: UTD-will be due soon ? ?--------------------------------------------------------------------------------------------------- ?Hypertension, follow-up ? ?BP Readings from Last 3 Encounters:  ?11/13/21 (!) 156/84  ?03/18/21 (!) 149/90  ?12/03/20 (!) 160/90  ? Wt Readings from Last 3 Encounters:  ?11/13/21 235 lb 14.4 oz (107 kg)  ?03/18/21 238 lb (108 kg)  ?12/03/20 233 lb 9.6 oz (106 kg)  ?  ? ?He was last seen for hypertension 8 months ago.  ?BP at that visit was 149/90. ?Management since that visit includes; on amlodipine, lisinopril, and metoprolol. ?He reports excellent compliance with treatment. ?He is not having side effects.  ?He is exercising. ?He is not adherent to low salt diet.   ?Outside blood pressures are not being checked. ? ?He does not  smoke. ? ?--------------------------------------------------------------------------------------------------- ? ? ?Medications: ?Outpatient Medications Prior to Visit  ?Medication Sig  ? aspirin EC 81 MG tablet Take 81 mg by mouth daily.   ? Dulaglutide (TRULICITY) 0.93 AT/5.5DD SOPN INJECT 0.5 MILLILITERS SUB-Q ONCE A WEEK  ? metFORMIN (GLUCOPHAGE) 1000 MG tablet TAKE 1 TABLET BY MOUTH 2 TIMES DAILY WITH A MEAL.  ? tadalafil (CIALIS) 20 MG tablet TAKE ONE TABLET BY MOUTH EVERY THREE DAYS AS NEEDED FOR ERECTILE DYSFUNCTION  ? ACCU-CHEK GUIDE test strip Please specify directions, refills and quantity  ? albuterol (VENTOLIN HFA) 108 (90 Base) MCG/ACT inhaler TAKE 2 PUFFS BY MOUTH EVERY 6 HOURS AS NEEDED FOR WHEEZE OR SHORTNESS OF BREATH  ? amLODipine (NORVASC) 5 MG tablet TAKE 1 TABLET BY MOUTH EVERY DAY  ? atorvastatin (LIPITOR) 40 MG tablet Take 40 mg by mouth at bedtime. (Patient not taking: Reported on 11/13/2021)  ? Blood Glucose Monitoring Suppl (ACCU-CHEK NANO SMARTVIEW) w/Device KIT 1 strip by Does not apply route daily.  ? chlorpheniramine-HYDROcodone (TUSSIONEX PENNKINETIC ER) 10-8 MG/5ML SUER Take 5 mLs by mouth every 12 (twelve) hours as needed for cough.  ? glipiZIDE (GLUCOTROL XL) 10 MG 24 hr tablet Take by mouth.  ? glucose blood test strip 1 each by Other route as needed for other. Use as instructed  ? lisinopril (ZESTRIL) 40 MG tablet TAKE 1 TABLET BY MOUTH EVERY DAY (Patient not taking: Reported on 11/13/2021)  ? metoprolol tartrate (LOPRESSOR) 25 MG tablet Take by mouth.  ? nitroGLYCERIN (NITROSTAT) 0.4 MG SL tablet SMARTSIG:1 Tablet(s) Sublingual PRN  ? ONETOUCH  DELICA LANCETS 50I MISC 2 (two) times daily. for testing  ? SYMBICORT 80-4.5 MCG/ACT inhaler TAKE 2 PUFFS BY MOUTH TWICE A DAY (Patient not taking: Reported on 03/18/2021)  ? ?No facility-administered medications prior to visit.  ? ? ?Review of Systems  ?Constitutional:  Negative for appetite change, chills and fever.  ?Respiratory:  Negative  for chest tightness, shortness of breath and wheezing.   ?Cardiovascular:  Negative for chest pain and palpitations.  ?Gastrointestinal:  Negative for abdominal pain, nausea and vomiting.  ? ?  ?  Objective  ?  ?BP (!) 156/84 (BP Location: Right Arm, Patient Position: Sitting, Cuff Size: Large)   Pulse 77   Resp 16   Ht 5' 10"  (1.778 m)   Wt 235 lb 14.4 oz (107 kg)   BMI 33.85 kg/m?  ?BP Readings from Last 3 Encounters:  ?11/13/21 (!) 156/84  ?03/18/21 (!) 149/90  ?12/03/20 (!) 160/90  ? ?Wt Readings from Last 3 Encounters:  ?11/13/21 235 lb 14.4 oz (107 kg)  ?03/18/21 238 lb (108 kg)  ?12/03/20 233 lb 9.6 oz (106 kg)  ? ?  ? ?Physical Exam ?Vitals reviewed.  ?Constitutional:   ?   Appearance: He is well-developed.  ?HENT:  ?   Head: Normocephalic and atraumatic.  ?   Right Ear: External ear normal.  ?   Left Ear: External ear normal.  ?   Nose: Nose normal.  ?Eyes:  ?   Conjunctiva/sclera: Conjunctivae normal.  ?   Pupils: Pupils are equal, round, and reactive to light.  ?Cardiovascular:  ?   Rate and Rhythm: Normal rate and regular rhythm.  ?   Heart sounds: Normal heart sounds.  ?   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 ?Pulmonary:  ?   Effort: Pulmonary effort is normal.  ?   Breath sounds: Normal breath sounds.  ?Abdominal:  ?   General: Bowel sounds are normal.  ?   Palpations: Abdomen is soft.  ?Musculoskeletal:  ?   Cervical back: Neck supple.  ?   Right lower leg: No edema.  ?   Left lower leg: No edema.  ?Skin: ?   General: Skin is warm and dry.  ?Neurological:  ?   General: No focal deficit present.  ?   Mental Status: He is alert and oriented to person, place, and time.  ?   Deep Tendon Reflexes: Reflexes are normal and symmetric.  ?Psychiatric:     ?   Mood and Affect: Mood normal.     ?   Behavior: Behavior normal.     ?   Thought Content: Thought content normal.     ?   Judgment: Judgment normal.  ?  ? ? ?Results for orders placed or performed in visit on 11/13/21  ?POCT  glycosylated hemoglobin (Hb A1C)  ?Result Value Ref Range  ? Hemoglobin A1C 6.6 (A) 4.0 - 5.6 %  ? PT 143   ? ? Assessment & Plan  ?  ? ?1. Diabetes mellitus without complication (HCC) ?3B0.4 today.  Work on diet and exercise. ?- POCT glycosylated hemoglobin (Hb A1C) ? ?2. Essential (primary) hypertension ?May need to add amlodipine 5 ? ?3. Hyperlipidemia associated with type 2 diabetes mellitus ([place]) ?On statin ? ?4. S/P CABG x 3 ?All risk factors treated ? ?5. Type 2 diabetes mellitus with other circulatory complication, without long-term current use of insulin ([place]) ?Type 2 diabetes with CAD ? ? ?No follow-ups on file.  ?   ? ?  I, [name], MD, have reviewed all documentation for this visit. The documentation on 11/16/21 for the exam, diagnosis, procedures, and orders are all accurate and complete. ? ? ? ?Karla Vines Cranford Mon, MD  ?[facility] ?[phone] (phone) ?[phone] (fax) ? ?[facility] ?

## 2021-12-20 ENCOUNTER — Other Ambulatory Visit: Payer: Self-pay | Admitting: Family Medicine

## 2021-12-20 DIAGNOSIS — I10 Essential (primary) hypertension: Secondary | ICD-10-CM

## 2021-12-20 DIAGNOSIS — E1169 Type 2 diabetes mellitus with other specified complication: Secondary | ICD-10-CM

## 2021-12-20 NOTE — Telephone Encounter (Signed)
Requested Prescriptions  ?Pending Prescriptions Disp Refills  ?? lisinopril (ZESTRIL) 40 MG tablet [Pharmacy Med Name: LISINOPRIL 40 MG TABLET] 30 tablet 2  ?  Sig: TAKE 1 TABLET BY MOUTH EVERY DAY  ?  ? Cardiovascular:  ACE Inhibitors Failed - 12/20/2021  2:38 AM  ?  ?  Failed - Cr in normal range and within 180 days  ?  Creatinine, Ser  ?Date Value Ref Range Status  ?03/18/2021 1.16 0.76 - 1.27 mg/dL Final  ?   ?  ?  Failed - K in normal range and within 180 days  ?  Potassium  ?Date Value Ref Range Status  ?03/18/2021 4.6 3.5 - 5.2 mmol/L Final  ?   ?  ?  Failed - Last BP in normal range  ?  BP Readings from Last 1 Encounters:  ?11/13/21 (!) 156/84  ?   ?  ?  Passed - Patient is not pregnant  ?  ?  Passed - Valid encounter within last 6 months  ?  Recent Outpatient Visits   ?      ? 1 month ago Diabetes mellitus without complication ([place])  ? [facility] Jerrol Banana[facility]  ? 9 months ago Diabetes mellitus without complication [facility]  ? [facility]rol Ban[facility] MD  ? 1 year ago Diabetes mellitus without complication [facility])  ? [facility] Jerrol Ban[facility] MD  ? 1 year ago Annual physical exam  ? [facility] Jerrol Banana., MD  ? 1 year ago Diabetes mellitus without complication [facility])  ? [facility] Jerrol Banana., MD  ?  ?  ?Future Appointments   ?        ? In 3 months Jerrol Banana., MD [facility]  ?  ? ?  ?  ?  ?? metFORMIN (GLUCOPHAGE) 1000 MG tablet [Pharmacy Med Name: METFORMIN HCL 1,000 MG TABLET] 60 tablet 2  ?  Sig: TAKE 1 TABLET BY MOUTH 2 TIMES DAILY WITH A MEAL.  ?  ? Endocrinology:  Diabetes - Biguanides Failed - 12/20/2021  2:38 AM  ?  ?  Failed - B12 Level in normal range and within 720 days  ?  No results found for: VITAMINB12   ?  ?  Passed - Cr in normal range and within 360 days  ?  Creatinine, Ser  ?Date Value Ref Range Status  ?03/18/2021 1.16 0.76 - 1.27  mg/dL Final  ?   ?  ?  Passed - HBA1C is between 0 and 7.9 and within 180 days  ?  Hemoglobin A1C  ?Date Value Ref Range Status  ?11/13/2021 6.6 (A) 4.0 - 5.6 % Final  ? ?Hgb A1c MFr Bld  ?Date Value Ref Range Status  ?03/18/2021 6.3 (H) 4.8 - 5.6 % Final  ?  Comment:  ?           Prediabetes: 5.7 - 6.4 ?         Diabetes: >6.4 ?         Glycemic control for adults with diabetes: <7.0 ?  ?   ?  ?  Passed - eGFR in normal range and within 360 days  ?  GFR calc Af Amer  ?Date Value Ref Range Status  ?08/07/2020 90 >59 mL/min/1.73 Final  ?  Comment:  ?  **In accordance with recommendations from the NKF-ASN Task force,** ?  [facility] is in the  process of updating its eGFR calculation to the ?  2021 CKD-EPI creatinine equation that estimates kidney function ?  without a race variable. ?  ? ?GFR calc non Af Amer  ?Date Value Ref Range Status  ?08/07/2020 77 >59 mL/min/1.73 Final  ? ?eGFR  ?Date Value Ref Range Status  ?03/18/2021 71 >59 mL/min/1.73 Final  ?   ?  ?  Passed - Valid encounter within last 6 months  ?  Recent Outpatient Visits   ?      ? 1 month ago Diabetes mellitus without complication ([place])  ? [facility] Jerrol Banana., MD  ? 9 months ago Diabetes mellitus without complication [facility])  ? [facility]rol Banana., MD  ? 1 year ago Diabetes mellitus without complication [facility])  ? [facility] Jerrol Banana[facility] MD  ? 1 year ago Annual physical exam  ? [facility] Ban[facility] MD  ? 1 year ago Diabetes mellitus without complication [facility])  ? [facility] Jerrol Banana., MD  ?  ?  ?Future Appointments   ?        ? In 3 months Jerrol Banana., MD [facility]  ?  ? ?  ?  ?  Passed - CBC within normal limits and completed in the last 12 months  ?  WBC  ?Date Value Ref Range Status  ?03/18/2021 7.3 3.4 - 10.8 x10E3/uL Final  ? ?RBC  ?Date Value Ref Range Status  ?03/18/2021 5.01  4.14 - 5.80 x10E6/uL Final  ? ?Hemoglobin  ?Date Value Ref Range Status  ?03/18/2021 14.6 13.0 - 17.7 g/dL Final  ? ?Hematocrit  ?Date Value Ref Range Status  ?03/18/2021 43.5 37.5 - 51.0 % Final  ? ?MCHC  ?Date Value Ref Range Status  ?03/18/2021 33.6 31.5 - 35.7 g/dL Final  ? ?MCH  ?Date Value Ref Range Status  ?03/18/2021 29.1 26.6 - 33.0 pg Final  ? ?MCV  ?Date Value Ref Range Status  ?03/18/2021 87 79 - 97 fL Final  ? ?No results found for: PLTCOUNTKUC, LABPLAT, [place] ?RDW  ?Date Value Ref Range Status  ?03/18/2021 12.1 11.6 - 15.4 % Final  ? ?  ?  ?  ? ?

## 2021-12-27 ENCOUNTER — Telehealth: Payer: Self-pay | Admitting: Family Medicine

## 2021-12-27 NOTE — Telephone Encounter (Signed)
Pt is calling to request a Medical excuse for Jury Duty Please advise CB- ?[phone] ?

## 2021-12-30 NOTE — Telephone Encounter (Signed)
Patient was advised.  

## 2021-12-30 NOTE — Telephone Encounter (Signed)
Please advise 

## 2022-03-12 DIAGNOSIS — Z951 Presence of aortocoronary bypass graft: Secondary | ICD-10-CM | POA: Diagnosis not present

## 2022-03-12 DIAGNOSIS — I2510 Atherosclerotic heart disease of native coronary artery without angina pectoris: Secondary | ICD-10-CM | POA: Diagnosis not present

## 2022-03-12 DIAGNOSIS — I2583 Coronary atherosclerosis due to lipid rich plaque: Secondary | ICD-10-CM | POA: Diagnosis not present

## 2022-03-12 DIAGNOSIS — I10 Essential (primary) hypertension: Secondary | ICD-10-CM | POA: Diagnosis not present

## 2022-03-19 ENCOUNTER — Other Ambulatory Visit: Payer: Self-pay | Admitting: Family Medicine

## 2022-03-25 ENCOUNTER — Encounter: Payer: 59 | Admitting: Family Medicine

## 2022-05-02 ENCOUNTER — Ambulatory Visit: Payer: Self-pay

## 2022-05-02 NOTE — Patient Outreach (Signed)
  Care Coordination   Initial Visit Note   05/02/2022 Name: Reginald Murphy MRN: [id number] DOB: 12-31-1955  Reginald Murphy is a 66 y.o. year old male who sees Jerrol Banana., MD for primary care. I spoke with  Reginald Murphy by phone today.  What matters to the patients health and wellness today?  The patient feels like he is doing well today and has no new needs. Knows to call for changes or concerns    Goals Addressed             This Visit's Progress    COMPLETED: RNCM: Maintain Health and well being       Care Coordination Interventions: Evaluation of current treatment plan related to The patient has several chronic conditions and patient's adherence to plan as established by provider. The patient feels like his health is stable at this time and denies any acute needs. Knows to call for changes or new concerns. Information provided on how to reach the [facility]. Advised patient to call the office for changes in his chronic conditions, questions, and needs. Reviewed with the patient the role of the RNCM and the program. The patient denies any immediate needs or concerns but thanked the [facility] for information.  Reviewed scheduled/upcoming provider appointments including 05-15-2022 at [facility] am, reminder given. The patient saw the cardiologist on 03-12-2022 and got a good report.            SDOH assessments and interventions completed:  Yes  SDOH Interventions Today    Flowsheet Row Most Recent Value  SDOH Interventions   Food Insecurity Interventions Intervention Not Indicated  Transportation Interventions Intervention Not Indicated        Care Coordination Interventions Activated:  Yes  Care Coordination Interventions:  Yes, provided   Follow up plan: No further intervention required.   Encounter Outcome:  Pt. Visit Completed   Noreene Larsson RN, MSN, [place] [facility] Mobile: [phone]

## 2022-05-02 NOTE — Patient Instructions (Signed)
Visit Information  Thank you for taking time to visit with me today. Please don't hesitate to contact me if I can be of assistance to you.   Following are the goals we discussed today:   Goals Addressed             This Visit's Progress    COMPLETED: RNCM: Maintain Health and well being       Care Coordination Interventions: Evaluation of current treatment plan related to The patient has several chronic conditions and patient's adherence to plan as established by provider. The patient feels like his health is stable at this time and denies any acute needs. Knows to call for changes or new concerns. Information provided on how to reach the [facility]. Advised patient to call the office for changes in his chronic conditions, questions, and needs. Reviewed with the patient the role of the RNCM and the program. The patient denies any immediate needs or concerns but thanked the [facility] for information.  Reviewed scheduled/upcoming provider appointments including 05-15-2022 at [facility] am, reminder given. The patient saw the cardiologist on 03-12-2022 and got a good report.            Please call the care guide team at [phone] if you need to schedule an appointment.   If you are experiencing a Mental Health or [place] or need someone to talk to, please call the Suicide and Crisis Lifeline: 988 call the [place] National Suicide Prevention Lifeline: (825)197-9267 or TTY: 475-823-4887 TTY (606)429-4926) to talk to a trained counselor call 1-800-273-TALK (toll free, 24 hour hotline)  Patient verbalizes understanding of instructions and care plan provided today and agrees to view in [place]. Active MyChart status and patient understanding of how to access instructions and care plan via MyChart confirmed with patient.     No further follow up required: the patient states he has no needs at this time. Knows to call for changes. Noreene Larsson RN, MSN, CCM Community Care Coordinator [place] [facility] Mobile: [phone]

## 2022-05-15 ENCOUNTER — Encounter: Payer: Self-pay | Admitting: Family Medicine

## 2022-05-15 ENCOUNTER — Ambulatory Visit (INDEPENDENT_AMBULATORY_CARE_PROVIDER_SITE_OTHER): Payer: 59 | Admitting: Family Medicine

## 2022-05-15 VITALS — BP 125/85 | HR 71 | Temp 98.4°F | Resp 16 | Ht 70.0 in | Wt 235.0 lb

## 2022-05-15 DIAGNOSIS — Z1211 Encounter for screening for malignant neoplasm of colon: Secondary | ICD-10-CM | POA: Diagnosis not present

## 2022-05-15 DIAGNOSIS — E785 Hyperlipidemia, unspecified: Secondary | ICD-10-CM

## 2022-05-15 DIAGNOSIS — I10 Essential (primary) hypertension: Secondary | ICD-10-CM | POA: Diagnosis not present

## 2022-05-15 DIAGNOSIS — Z125 Encounter for screening for malignant neoplasm of prostate: Secondary | ICD-10-CM | POA: Diagnosis not present

## 2022-05-15 DIAGNOSIS — Z951 Presence of aortocoronary bypass graft: Secondary | ICD-10-CM

## 2022-05-15 DIAGNOSIS — Z0000 Encounter for general adult medical examination without abnormal findings: Secondary | ICD-10-CM

## 2022-05-15 DIAGNOSIS — E1169 Type 2 diabetes mellitus with other specified complication: Secondary | ICD-10-CM | POA: Diagnosis not present

## 2022-05-15 DIAGNOSIS — E119 Type 2 diabetes mellitus without complications: Secondary | ICD-10-CM

## 2022-05-15 NOTE — Progress Notes (Unsigned)
I,Reginald Murphy,acting as a scribe for Reginald Durie, MD.,have documented all relevant documentation on the behalf of Reginald Durie, MD,as directed by  Reginald Durie, MD while in the presence of Reginald Durie, MD.    Complete physical exam   Patient: Reginald Murphy   DOB: Mar 25, 1956   66 y.o. Male  MRN: [id number] Visit Date: 05/15/2022  Today's healthcare provider: [name], MD   Chief Complaint  Patient presents with   Annual Exam   Subjective    Reginald Murphy is a 66 y.o. male who presents today for a complete physical exam.  He is feeling well.  This is his second marriage, he is retired.  He has 2 children and 2 stepchildren 1 grandchild and 1 grandchild on the way.  His mother is in failing health with dementia at [facility].  His father is deceased.    Past Medical History:  Diagnosis Date   Diabetes mellitus without complication ([place])    Family history of cancer    Hypertension    Personal history of colonic polyps    Past Surgical History:  Procedure Laterality Date   COLONOSCOPY WITH PROPOFOL N/A 09/20/2020   Procedure: COLONOSCOPY WITH PROPOFOL;  Surgeon: [name], MD;  Location: [facility] ENDOSCOPY;  Service: Gastroenterology;  Laterality: N/A;   LEFT HEART CATH AND CORONARY ANGIOGRAPHY Left 03/27/2020   Procedure: LEFT HEART CATH AND CORONARY ANGIOGRAPHY;  Surgeon: Teodoro Spray, MD;  Location: [place] [facility];  Service: Cardiovascular;  Laterality: Left;   Social History   Socioeconomic History   Marital status: Married    Spouse name: Reginald Murphy    Number of children: 2   Years of education: Not on file   Highest education level: Not on file  Occupational History   Not on file  Tobacco Use   Smoking status: Never   Smokeless tobacco: Never  Vaping Use   Vaping Use: Never used  Substance and Sexual Activity   Alcohol use: Yes    Alcohol/week: 0.0 standard drinks of alcohol    Comment: occasionally   Drug  use: No   Sexual activity: Not on file  Other Topics Concern   Not on file  Social History Narrative   Not on file   Social Determinants of Health   Financial Resource Strain: Not on file  Food Insecurity: No Food Insecurity (05/02/2022)   Hunger Vital Sign    Worried About Running Out of Food in the Last Year: Never true    Ran Out of Food in the Last Year: Never true  Transportation Needs: No Transportation Needs (05/02/2022)   PRAPARE - Hydrologist (Medical): No    Lack of Transportation (Non-Medical): No  Physical Activity: Not on file  Stress: Not on file  Social Connections: Not on file  Intimate Partner Violence: Not on file   Family Status  Relation Name Status   Mother  Alive   Father  Deceased   MGM  Deceased   MGF  Deceased   Pat Aunt  Deceased   Pat Uncle  Deceased   PGM  Deceased   PGF  Deceased   Daughter  Alive   Daughter  Alive   Family History  Problem Relation Age of Onset   Diabetes Mother    Seizures Father    Heart attack Maternal Grandmother    Cancer Maternal Grandfather  unk type   No Known Allergies  Patient Care Team: Jerrol Banana., MD as PCP - General (Family Medicine) Jerrol Banana., MD (Family Medicine) Bary Castilla Forest Gleason, MD (General Surgery)   Medications: Outpatient Medications Prior to Visit  Medication Sig   ACCU-CHEK GUIDE test strip Please specify directions, refills and quantity   aspirin EC 81 MG tablet Take 81 mg by mouth daily.    atorvastatin (LIPITOR) 40 MG tablet Take 40 mg by mouth at bedtime.   Blood Glucose Monitoring Suppl (ACCU-CHEK NANO SMARTVIEW) w/Device KIT 1 strip by Does not apply route daily.   chlorpheniramine-HYDROcodone (TUSSIONEX PENNKINETIC ER) 10-8 MG/5ML SUER Take 5 mLs by mouth every 12 (twelve) hours as needed for cough.   Dulaglutide (TRULICITY) 9.38 BO/1.7PZ SOPN INJECT 0.5 MILLILITERS SUB-Q ONCE A WEEK   glucose blood test strip 1 each by  Other route as needed for other. Use as instructed   metFORMIN (GLUCOPHAGE) 1000 MG tablet TAKE 1 TABLET BY MOUTH TWICE A DAY WITH MEALS   nitroGLYCERIN (NITROSTAT) 0.4 MG SL tablet SMARTSIG:1 Tablet(s) Sublingual PRN   ONETOUCH DELICA LANCETS 02H MISC 2 (two) times daily. for testing   tadalafil (CIALIS) 20 MG tablet TAKE ONE TABLET BY MOUTH EVERY THREE DAYS AS NEEDED FOR ERECTILE DYSFUNCTION   amLODipine (NORVASC) 5 MG tablet TAKE 1 TABLET BY MOUTH EVERY DAY (Patient not taking: Reported on 05/15/2022)   glipiZIDE (GLUCOTROL XL) 10 MG 24 hr tablet Take by mouth.   lisinopril (ZESTRIL) 40 MG tablet TAKE 1 TABLET BY MOUTH EVERY DAY (Patient not taking: Reported on 05/15/2022)   metoprolol tartrate (LOPRESSOR) 25 MG tablet Take by mouth.   SYMBICORT 80-4.5 MCG/ACT inhaler TAKE 2 PUFFS BY MOUTH TWICE A DAY (Patient not taking: Reported on 03/18/2021)   [DISCONTINUED] albuterol (VENTOLIN HFA) 108 (90 Base) MCG/ACT inhaler TAKE 2 PUFFS BY MOUTH EVERY 6 HOURS AS NEEDED FOR WHEEZE OR SHORTNESS OF BREATH (Patient not taking: Reported on 05/15/2022)   No facility-administered medications prior to visit.    Review of Systems  Constitutional: Negative.   HENT: Negative.    Eyes: Negative.   Respiratory: Negative.    Cardiovascular: Negative.   Gastrointestinal: Negative.   Endocrine: Negative.   Genitourinary: Negative.   Musculoskeletal: Negative.   Skin: Negative.   Allergic/Immunologic: Negative.   Neurological: Negative.   Hematological: Negative.   Psychiatric/Behavioral: Negative.      Last hemoglobin A1c Lab Results  Component Value Date   HGBA1C 6.4 (H) 05/15/2022      Objective     BP 125/85 (BP Location: Right Arm, Patient Position: Sitting, Cuff Size: Large)   Murphy 71   Temp 98.4 F (36.9 C) (Oral)   Resp 16   Ht 5' 10" (1.778 m)   Wt 235 lb (106.6 kg)   SpO2 96% Comment: room air  BMI 33.72 kg/m  BP Readings from Last 3 Encounters:  05/15/22 125/85  11/13/21 (!)  156/84  03/18/21 (!) 149/90   Wt Readings from Last 3 Encounters:  05/15/22 235 lb (106.6 kg)  11/13/21 235 lb 14.4 oz (107 kg)  03/18/21 238 lb (108 kg)      Physical Exam Constitutional:      Appearance: Normal appearance. He is normal weight.  HENT:     Head: Normocephalic and atraumatic.     Right Ear: Tympanic membrane, ear canal and external ear normal.     Left Ear: Tympanic membrane, ear canal and external ear normal.  Nose: Nose normal.     Mouth/Throat:     Mouth: Mucous membranes are moist.     Pharynx: Oropharynx is clear.  Eyes:     Extraocular Movements: Extraocular movements intact.     Conjunctiva/sclera: Conjunctivae normal.     Pupils: Pupils are equal, round, and reactive to light.  Cardiovascular:     Rate and Rhythm: Normal rate and regular rhythm.     Pulses: Normal pulses.     Heart sounds: Normal heart sounds.  Pulmonary:     Effort: Pulmonary effort is normal.     Breath sounds: Normal breath sounds.  Abdominal:     General: Bowel sounds are normal.     Palpations: Abdomen is soft.  Genitourinary:    Penis: Normal.      Testes: Normal.  Musculoskeletal:     Cervical back: Neck supple.  Skin:    General: Skin is warm and dry.  Neurological:     General: No focal deficit present.     Mental Status: He is alert and oriented to person, place, and time. Mental status is at baseline.  Psychiatric:        Mood and Affect: Mood normal.        Behavior: Behavior normal.        Thought Content: Thought content normal.        Judgment: Judgment normal.       Last depression screening scores    11/13/2021    2:37 PM 03/18/2021    9:39 AM 08/07/2020    8:15 AM  PHQ 2/9 Scores  PHQ - 2 Score 0 0 0  PHQ- 9 Score  0 0   Last fall risk screening    11/13/2021    2:36 PM  [place] in the past year? 0  Number falls in past yr: 0  Injury with Fall? 0   Last Audit-C alcohol use screening    03/18/2021    9:38 AM  Alcohol Use  Disorder Test (AUDIT)  1. How often do you have a drink containing alcohol? 1  2. How many drinks containing alcohol do you have on a typical day when you are drinking? 1  3. How often do you have six or more drinks on one occasion? 0  AUDIT-C Score 2   A score of 3 or more in women, and 4 or more in men indicates increased risk for alcohol abuse, EXCEPT if all of the points are from question 1   No results found for any visits on 05/15/22.  Assessment & Plan    Routine Health Maintenance and Physical Exam  Exercise Activities and Dietary recommendations  Goals   None     Immunization History  Administered Date(s) Administered   Hepatitis B, adult 06/09/2016   Pneumococcal Polysaccharide-23 06/09/2016   Tdap 07/04/2014    Health Maintenance  Topic Date Due   COVID-19 Vaccine (1) Never done   HIV Screening  Never done   Hepatitis C Screening  Never done   Zoster Vaccines- Shingrix (1 of 2) Never done   FOOT EXAM  02/23/2019   OPHTHALMOLOGY EXAM  07/19/2020   Diabetic kidney evaluation - Urine ACR  08/07/2021   COLONOSCOPY (Pts 45-20yr Insurance coverage will need to be confirmed)  09/20/2021   Diabetic kidney evaluation - GFR measurement  03/18/2022   INFLUENZA VACCINE  12/07/2022 (Originally 04/08/2022)   HEMOGLOBIN A1C  05/16/2022   TETANUS/TDAP  07/04/2024   HPV VACCINES  Aged Out    Discussed health benefits of physical activity, and encouraged him to engage in regular exercise appropriate for his age and condition.  1. Annual physical exam Patient up-to-date on all screening.  Recommended Shingrix and flu shot - CBC with Differential/Platelet - Comprehensive metabolic panel - Lipid Panel With LDL/HDL Ratio - TSH  2. Colon cancer screening  - Ambulatory referral to Gastroenterology  3. Prostate cancer screening  - PSA  4. 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  Good control on metformin Trulicity - Hemoglobin  A1c - Microalbumin / creatinine urine ratio  5. S/P CABG x 3 All risk factors treated 6. Hyperlipidemia associated with type 2 diabetes mellitus (HCC) On atorvastatin 40  7. Essential (primary) hypertension Good blood pressure control on lisinopril and amlodipine  No follow-ups on file.     I, Reginald Durie, MD, have reviewed all documentation for this visit. The documentation on 05/19/22 for the exam, diagnosis, procedures, and orders are all accurate and complete.    Richard Cranford Mon, MD  [facility] [phone] (phone) [phone] (fax)  [place]

## 2022-05-16 LAB — MICROALBUMIN / CREATININE URINE RATIO
Creatinine, Urine: 226.1 mg/dL
Microalb/Creat Ratio: 9 mg/g creat (ref 0–29)
Microalbumin, Urine: 20.4 ug/mL

## 2022-05-16 LAB — COMPREHENSIVE METABOLIC PANEL
ALT: 26 IU/L (ref 0–44)
AST: 18 IU/L (ref 0–40)
Albumin/Globulin Ratio: 2.1 (ref 1.2–2.2)
Albumin: 4.7 g/dL (ref 3.9–4.9)
Alkaline Phosphatase: 81 IU/L (ref 44–121)
BUN/Creatinine Ratio: 15 (ref 10–24)
BUN: 17 mg/dL (ref 8–27)
Bilirubin Total: 0.7 mg/dL (ref 0.0–1.2)
CO2: 20 mmol/L (ref 20–29)
Calcium: 9.1 mg/dL (ref 8.6–10.2)
Chloride: 104 mmol/L (ref 96–106)
Creatinine, Ser: 1.14 mg/dL (ref 0.76–1.27)
Globulin, Total: 2.2 g/dL (ref 1.5–4.5)
Glucose: 125 mg/dL — ABNORMAL HIGH (ref 70–99)
Potassium: 4.7 mmol/L (ref 3.5–5.2)
Sodium: 142 mmol/L (ref 134–144)
Total Protein: 6.9 g/dL (ref 6.0–8.5)
eGFR: 72 mL/min/{1.73_m2} (ref >59)

## 2022-05-16 LAB — CBC WITH DIFFERENTIAL/PLATELET
Basophils Absolute: 0 10*3/uL (ref 0.0–0.2)
Basos: 0 %
EOS (ABSOLUTE): 0.3 10*3/uL (ref 0.0–0.4)
Eos: 4 %
Hematocrit: 42.2 % (ref 37.5–51.0)
Hemoglobin: 14.8 g/dL (ref 13.0–17.7)
Immature Grans (Abs): 0 10*3/uL (ref 0.0–0.1)
Immature Granulocytes: 0 %
Lymphocytes Absolute: 2.1 10*3/uL (ref 0.7–3.1)
Lymphs: 30 %
MCH: 30.5 pg (ref 26.6–33.0)
MCHC: 35.1 g/dL (ref 31.5–35.7)
MCV: 87 fL (ref 79–97)
Monocytes Absolute: 0.6 10*3/uL (ref 0.1–0.9)
Monocytes: 8 %
Neutrophils Absolute: 4 10*3/uL (ref 1.4–7.0)
Neutrophils: 58 %
Platelets: 176 10*3/uL (ref 150–450)
RBC: 4.86 x10E6/uL (ref 4.14–5.80)
RDW: 12.3 % (ref 11.6–15.4)
WBC: 7.1 10*3/uL (ref 3.4–10.8)

## 2022-05-16 LAB — HEMOGLOBIN A1C
Est. average glucose Bld gHb Est-mCnc: 137 mg/dL
Hgb A1c MFr Bld: 6.4 % — ABNORMAL HIGH (ref 4.8–5.6)

## 2022-05-16 LAB — LIPID PANEL WITH LDL/HDL RATIO
Cholesterol, Total: 89 mg/dL — ABNORMAL LOW (ref 100–199)
HDL: 27 mg/dL — ABNORMAL LOW (ref >39)
LDL Chol Calc (NIH): 35 mg/dL (ref 0–99)
LDL/HDL Ratio: 1.3 ratio (ref 0.0–3.6)
Triglycerides: 158 mg/dL — ABNORMAL HIGH (ref 0–149)
VLDL Cholesterol Cal: 27 mg/dL (ref 5–40)

## 2022-05-16 LAB — TSH: TSH: 2.75 u[IU]/mL (ref 0.450–4.500)

## 2022-05-16 LAB — PSA: Prostate Specific Ag, Serum: 0.6 ng/mL (ref 0.0–4.0)

## 2022-09-26 DIAGNOSIS — Z23 Encounter for immunization: Secondary | ICD-10-CM | POA: Diagnosis not present

## 2022-09-26 DIAGNOSIS — I2510 Atherosclerotic heart disease of native coronary artery without angina pectoris: Secondary | ICD-10-CM | POA: Diagnosis not present

## 2022-09-26 DIAGNOSIS — I2583 Coronary atherosclerosis due to lipid rich plaque: Secondary | ICD-10-CM | POA: Diagnosis not present

## 2022-09-26 DIAGNOSIS — E782 Mixed hyperlipidemia: Secondary | ICD-10-CM | POA: Diagnosis not present

## 2022-09-26 DIAGNOSIS — Z794 Long term (current) use of insulin: Secondary | ICD-10-CM | POA: Diagnosis not present

## 2022-09-26 DIAGNOSIS — E1169 Type 2 diabetes mellitus with other specified complication: Secondary | ICD-10-CM | POA: Diagnosis not present

## 2022-09-26 DIAGNOSIS — Z951 Presence of aortocoronary bypass graft: Secondary | ICD-10-CM | POA: Diagnosis not present

## 2022-09-26 DIAGNOSIS — I10 Essential (primary) hypertension: Secondary | ICD-10-CM | POA: Diagnosis not present

## 2022-11-06 DIAGNOSIS — I25708 Atherosclerosis of coronary artery bypass graft(s), unspecified, with other forms of angina pectoris: Secondary | ICD-10-CM | POA: Diagnosis not present

## 2022-11-06 DIAGNOSIS — Z794 Long term (current) use of insulin: Secondary | ICD-10-CM | POA: Diagnosis not present

## 2022-11-06 DIAGNOSIS — I10 Essential (primary) hypertension: Secondary | ICD-10-CM | POA: Diagnosis not present

## 2022-11-06 DIAGNOSIS — E1169 Type 2 diabetes mellitus with other specified complication: Secondary | ICD-10-CM | POA: Diagnosis not present

## 2022-12-23 ENCOUNTER — Other Ambulatory Visit: Payer: Self-pay

## 2022-12-23 MED ORDER — TRULICITY 0.75 MG/0.5ML ~~LOC~~ SOAJ
0.7500 mg | SUBCUTANEOUS | 1 refills | Status: AC
  Filled 2022-12-23: qty 2, 28d supply, fill #0
  Filled 2023-01-16 – 2023-01-26 (×5): qty 2, 28d supply, fill #1

## 2023-01-15 ENCOUNTER — Telehealth: Payer: Self-pay | Admitting: Gastroenterology

## 2023-01-15 NOTE — Telephone Encounter (Signed)
Pt left message to schedule colonoscopy please return call  

## 2023-01-16 ENCOUNTER — Telehealth: Payer: Self-pay

## 2023-01-16 ENCOUNTER — Other Ambulatory Visit: Payer: Self-pay

## 2023-01-16 DIAGNOSIS — Z86010 Personal history of colonic polyps: Secondary | ICD-10-CM

## 2023-01-16 MED ORDER — NA SULFATE-K SULFATE-MG SULF 17.5-3.13-1.6 GM/177ML PO SOLN: 1.0000 | Freq: Once | ORAL | 0 refills | Status: AC

## 2023-01-16 NOTE — Telephone Encounter (Signed)
Pt LVMM to schedule colonoscopy please return call 

## 2023-01-16 NOTE — Addendum Note (Signed)
Addended by: Avie Arenas on: 01/16/2023 02:22 PM   Modules accepted: Orders

## 2023-01-16 NOTE — Telephone Encounter (Signed)
Gastroenterology Pre-Procedure Review  Request Date: 02/10/23 Requesting Physician: Dr. Tobi Bastos  PATIENT REVIEW QUESTIONS: The patient responded to the following health history questions as indicated:    1. Are you having any GI issues? no 2. Do you have a personal history of Polyps? yes (last colonoscopy performed by Dr. Tobi Bastos 09/20/20) 3. Do you have a family history of Colon Cancer or Polyps? no 4. Diabetes Mellitus? yes (has been advised to stop Trulicity 7 days prior to colonoscopy.  Stop metformin 2 days prior to colonoscopy.) 5. Joint replacements in the past 12 months?no 6. Major health problems in the past 3 months?no 7. Any artificial heart valves, MVP, or defibrillator?no    MEDICATIONS & ALLERGIES:    Patient reports the following regarding taking any anticoagulation/antiplatelet therapy:   Plavix, Coumadin, Eliquis, Xarelto, Lovenox, Pradaxa, Brilinta, or Effient? no Aspirin? yes (81 mg daily)  Patient confirms/reports the following medications:  Current Outpatient Medications  Medication Sig Dispense Refill   ACCU-CHEK GUIDE test strip Please specify directions, refills and quantity 100 each 11   amLODipine (NORVASC) 5 MG tablet TAKE 1 TABLET BY MOUTH EVERY DAY (Patient not taking: Reported on 05/15/2022) 90 tablet 3   aspirin EC 81 MG tablet Take 81 mg by mouth daily.      atorvastatin (LIPITOR) 40 MG tablet Take 40 mg by mouth at bedtime.     Blood Glucose Monitoring Suppl (ACCU-CHEK NANO SMARTVIEW) w/Device KIT 1 strip by Does not apply route daily. 1 kit 0   chlorpheniramine-HYDROcodone (TUSSIONEX PENNKINETIC ER) 10-8 MG/5ML SUER Take 5 mLs by mouth every 12 (twelve) hours as needed for cough. 140 mL 0   Dulaglutide (TRULICITY) 0.75 MG/0.5ML SOPN INJECT 0.5 MILLILITERS SUB-Q ONCE A WEEK 6 mL 7   Dulaglutide (TRULICITY) 0.75 MG/0.5ML SOPN Inject 0.5 mLs (0.75 mg total) under the skin once a week for 4 doses Continue to take on thursdays. 2 mL 1   glucose blood test strip 1  each by Other route as needed for other. Use as instructed     lisinopril (ZESTRIL) 40 MG tablet TAKE 1 TABLET BY MOUTH EVERY DAY (Patient not taking: Reported on 05/15/2022) 30 tablet 2   metFORMIN (GLUCOPHAGE) 1000 MG tablet TAKE 1 TABLET BY MOUTH TWICE A DAY WITH MEALS 189 tablet 3   metoprolol tartrate (LOPRESSOR) 25 MG tablet Take by mouth.     nitroGLYCERIN (NITROSTAT) 0.4 MG SL tablet SMARTSIG:1 Tablet(s) Sublingual PRN     ONETOUCH DELICA LANCETS 33G MISC 2 (two) times daily. for testing  12   SYMBICORT 80-4.5 MCG/ACT inhaler TAKE 2 PUFFS BY MOUTH TWICE A DAY (Patient not taking: Reported on 03/18/2021) 30.6 Inhaler 1   tadalafil (CIALIS) 20 MG tablet TAKE ONE TABLET BY MOUTH EVERY THREE DAYS AS NEEDED FOR ERECTILE DYSFUNCTION 30 tablet 5   No current facility-administered medications for this visit.    Patient confirms/reports the following allergies:  No Known Allergies  No orders of the defined types were placed in this encounter.   AUTHORIZATION INFORMATION Primary Insurance: 1D#: Group #:  Secondary Insurance: 1D#: Group #:  SCHEDULE INFORMATION: Date: 02/10/23 Time: Location: [facility]

## 2023-01-20 ENCOUNTER — Other Ambulatory Visit: Payer: Self-pay

## 2023-01-20 ENCOUNTER — Telehealth: Payer: Self-pay

## 2023-01-20 DIAGNOSIS — Z86010 Personal history of colonic polyps: Secondary | ICD-10-CM

## 2023-01-20 NOTE — Telephone Encounter (Signed)
Cardiac clearance received from Dr. Darrold Junker on 01/19/23.  Patient is cleared to have procedure.  Colonoscopy 02/10/23 at [facility] with Dr. Tobi Bastos.  Thanks, [place], [place]

## 2023-01-22 ENCOUNTER — Other Ambulatory Visit: Payer: Self-pay

## 2023-01-22 MED ORDER — TRULICITY 0.75 MG/0.5ML ~~LOC~~ SOAJ
0.7500 mg | SUBCUTANEOUS | 1 refills | Status: DC
  Filled 2023-01-22 – 2023-02-27 (×2): qty 2, 28d supply, fill #0
  Filled 2023-03-30: qty 2, 28d supply, fill #1

## 2023-01-26 ENCOUNTER — Other Ambulatory Visit: Payer: Self-pay

## 2023-02-05 DIAGNOSIS — E1169 Type 2 diabetes mellitus with other specified complication: Secondary | ICD-10-CM | POA: Diagnosis not present

## 2023-02-05 DIAGNOSIS — Z951 Presence of aortocoronary bypass graft: Secondary | ICD-10-CM | POA: Diagnosis not present

## 2023-02-05 DIAGNOSIS — E782 Mixed hyperlipidemia: Secondary | ICD-10-CM | POA: Diagnosis not present

## 2023-02-05 DIAGNOSIS — I25708 Atherosclerosis of coronary artery bypass graft(s), unspecified, with other forms of angina pectoris: Secondary | ICD-10-CM | POA: Diagnosis not present

## 2023-02-05 DIAGNOSIS — Z794 Long term (current) use of insulin: Secondary | ICD-10-CM | POA: Diagnosis not present

## 2023-02-05 DIAGNOSIS — I10 Essential (primary) hypertension: Secondary | ICD-10-CM | POA: Diagnosis not present

## 2023-02-09 ENCOUNTER — Encounter: Payer: Self-pay | Admitting: Gastroenterology

## 2023-02-10 ENCOUNTER — Encounter: Payer: Self-pay | Admitting: Gastroenterology

## 2023-02-10 ENCOUNTER — Ambulatory Visit: Payer: 59 | Admitting: Certified Registered"

## 2023-02-10 ENCOUNTER — Ambulatory Visit
Admission: RE | Admit: 2023-02-10 | Discharge: 2023-02-10 | Disposition: A | Discharge status: Home / Self Care | Payer: 59 | Attending: Gastroenterology | Admitting: Gastroenterology

## 2023-02-10 ENCOUNTER — Encounter
Admission: RE | Disposition: A | Discharge status: Home / Self Care | Payer: Self-pay | Source: Home / Self Care | Attending: Gastroenterology

## 2023-02-10 DIAGNOSIS — E119 Type 2 diabetes mellitus without complications: Secondary | ICD-10-CM | POA: Diagnosis not present

## 2023-02-10 DIAGNOSIS — Z1211 Encounter for screening for malignant neoplasm of colon: Secondary | ICD-10-CM | POA: Diagnosis present

## 2023-02-10 DIAGNOSIS — Z86010 Personal history of colonic polyps: Secondary | ICD-10-CM | POA: Insufficient documentation

## 2023-02-10 DIAGNOSIS — I10 Essential (primary) hypertension: Secondary | ICD-10-CM | POA: Diagnosis not present

## 2023-02-10 DIAGNOSIS — Z09 Encounter for follow-up examination after completed treatment for conditions other than malignant neoplasm: Secondary | ICD-10-CM | POA: Diagnosis not present

## 2023-02-10 DIAGNOSIS — Z7985 Long-term (current) use of injectable non-insulin antidiabetic drugs: Secondary | ICD-10-CM | POA: Insufficient documentation

## 2023-02-10 DIAGNOSIS — D126 Benign neoplasm of colon, unspecified: Secondary | ICD-10-CM | POA: Diagnosis not present

## 2023-02-10 DIAGNOSIS — Z7984 Long term (current) use of oral hypoglycemic drugs: Secondary | ICD-10-CM | POA: Insufficient documentation

## 2023-02-10 HISTORY — PX: COLONOSCOPY WITH PROPOFOL: SHX5780

## 2023-02-10 LAB — GLUCOSE, CAPILLARY: Glucose-Capillary: 161 mg/dL — ABNORMAL HIGH (ref 70–99)

## 2023-02-10 SURGERY — COLONOSCOPY WITH PROPOFOL
Anesthesia: General

## 2023-02-10 MED ORDER — LIDOCAINE HCL (CARDIAC) PF 100 MG/5ML IV SOSY
PREFILLED_SYRINGE | INTRAVENOUS | Status: DC | PRN
  Administered 2023-02-10: 100 mg via INTRAVENOUS

## 2023-02-10 MED ORDER — PROPOFOL 10 MG/ML IV BOLUS
INTRAVENOUS | Status: AC
  Filled 2023-02-10: qty 20

## 2023-02-10 MED ORDER — SODIUM CHLORIDE 0.9 % IV SOLN: INTRAVENOUS | Status: DC

## 2023-02-10 MED ORDER — LIDOCAINE HCL (PF) 2 % IJ SOLN
INTRAMUSCULAR | Status: AC
  Filled 2023-02-10: qty 5

## 2023-02-10 MED ORDER — PROPOFOL 10 MG/ML IV BOLUS
INTRAVENOUS | Status: DC | PRN
  Administered 2023-02-10: 120 ug/kg/min via INTRAVENOUS
  Administered 2023-02-10: 110 mg via INTRAVENOUS

## 2023-02-10 NOTE — Op Note (Signed)
[facility] Gastroenterology Patient Name: Reginald Murphy Procedure Date: 02/10/2023 9:09 AM MRN: [id number] Account #: [id number] Date of Birth: 17-Aug-1956 Admit Type: Outpatient Age: 67 Room: [facility] ENDO ROOM 2 Gender: Male Note Status: Finalized Instrument Name: Prentice Docker [id number] Procedure:             Colonoscopy Indications:           Surveillance: Personal history of adenomatous polyps                         on last colonoscopy > 3 years ago, Last colonoscopy:                         January 2022 Providers:             Wyline Mood MD, MD Referring MD:          Ferdinand Lango. Sullivan Lone, MD (Referring MD) Medicines:             Monitored Anesthesia Care Complications:         No immediate complications. Procedure:             Pre-Anesthesia Assessment:                        - Prior to the procedure, a History and Physical was                         performed, and patient medications, allergies and                         sensitivities were reviewed. The patient's tolerance                         of previous anesthesia was reviewed.                        - The risks and benefits of the procedure and the                         sedation options and risks were discussed with the                         patient. All questions were answered and informed                         consent was obtained.                        - ASA Grade Assessment: II - A patient with mild                         systemic disease.                        After obtaining informed consent, the colonoscope was                         passed under direct vision. Throughout the procedure,                         the patient's  blood pressure, pulse, and oxygen                         saturations were monitored continuously. The                         Colonoscope was introduced through the anus and                         advanced to the the cecum, identified by the                          appendiceal orifice. The colonoscopy was performed                         with ease. The patient tolerated the procedure well.                         The quality of the bowel preparation was excellent.                         The ileocecal valve, appendiceal orifice, and rectum                         were photographed. Findings:      The perianal and digital rectal examinations were normal.      Two sessile polyps were found in the cecum. The polyps were 4 to 5 mm in       size. These polyps were removed with a cold snare. Resection and       retrieval were complete.      Two sessile polyps were found in the ascending colon. The polyps were 5       to 6 mm in size. These polyps were removed with a cold snare. Resection       and retrieval were complete.      A 5 mm polyp was found in the sigmoid colon. The polyp was sessile. The       polyp was removed with a cold snare. Resection and retrieval were       complete.      The exam was otherwise without abnormality on direct and retroflexion       views. Impression:            - Two 4 to 5 mm polyps in the cecum, removed with a                         cold snare. Resected and retrieved.                        - Two 5 to 6 mm polyps in the ascending colon, removed                         with a cold snare. Resected and retrieved.                        - One 5 mm polyp in the sigmoid colon, removed with a  cold snare. Resected and retrieved.                        - The examination was otherwise normal on direct and                         retroflexion views. Recommendation:        - Discharge patient to home (with escort).                        - Resume previous diet.                        - Continue present medications.                        - Await pathology results.                        - Repeat colonoscopy for surveillance based on                         pathology results. Procedure Code(s):     ---  Professional ---                        [id number], Colonoscopy, flexible; with removal of                         tumor(s), polyp(s), or other lesion(s) by snare                         technique Diagnosis Code(s):     --- Professional ---                        Z86.010, Personal history of colonic polyps                        D12.0, Benign neoplasm of cecum                        D12.2, Benign neoplasm of ascending colon                        D12.5, Benign neoplasm of sigmoid colon CPT copyright 2022 American Medical Association. All rights reserved. The codes documented in this report are preliminary and upon coder review may  be revised to meet current compliance requirements. Wyline Mood, MD Wyline Mood MD, MD 02/10/2023 9:43:25 AM This report has been signed electronically. Number of Addenda: 0 Note Initiated On: 02/10/2023 9:09 AM Scope Withdrawal Time: 0 hours 10 minutes 6 seconds  Total Procedure Duration: 0 hours 13 minutes 19 seconds  Estimated Blood Loss:  Estimated blood loss: none.      [facility]

## 2023-02-10 NOTE — Anesthesia Postprocedure Evaluation (Signed)
Anesthesia Post Note  Patient: Reginald Murphy  Procedure(s) Performed: COLONOSCOPY WITH PROPOFOL  Patient location during evaluation: Endoscopy Anesthesia Type: General Level of consciousness: awake and alert Pain management: pain level controlled Vital Signs Assessment: post-procedure vital signs reviewed and stable Respiratory status: spontaneous breathing, nonlabored ventilation, respiratory function stable and patient connected to nasal cannula oxygen Cardiovascular status: blood pressure returned to baseline and stable Postop Assessment: no apparent nausea or vomiting Anesthetic complications: no   No notable events documented.   Last Vitals:  Vitals:   02/10/23 0945 02/10/23 0955  BP: 107/88 104/66  Pulse: 97 89  Resp: 12 16  Temp: (!) 36.1 C   SpO2: 93% 94%    Last Pain:  Vitals:   02/10/23 0955  TempSrc:   PainSc: 0-No pain                 Corinda Gubler

## 2023-02-10 NOTE — Transfer of Care (Signed)
Immediate Anesthesia Transfer of Care Note  Patient: Reginald Murphy  Procedure(s) Performed: COLONOSCOPY WITH PROPOFOL  Patient Location: Endoscopy Unit  Anesthesia Type:General  Level of Consciousness: drowsy  Airway & Oxygen Therapy: Patient Spontanous Breathing  Post-op Assessment: Report given to RN and Post -op Vital signs reviewed and stable  Post vital signs: Reviewed and stable  Last Vitals:  Vitals Value Taken Time  BP 107/88 02/10/23 0945  Temp 35.8 0945  Pulse 90 02/10/23 0946  Resp 17 02/10/23 0945  SpO2 94 % 02/10/23 0946  Vitals shown include unvalidated device data.  Last Pain:  Vitals:   02/10/23 0848  TempSrc: Temporal  PainSc: 0-No pain         Complications: No notable events documented.

## 2023-02-10 NOTE — Anesthesia Preprocedure Evaluation (Signed)
Anesthesia Evaluation  Patient identified by MRN, date of birth, ID band Patient awake    Reviewed: Allergy & Precautions, NPO status , Patient's Chart, lab work & pertinent test results  History of Anesthesia Complications Negative for: history of anesthetic complications  Airway Mallampati: II  TM Distance: >3 FB Neck ROM: Full    Dental no notable dental hx. (+) Teeth Intact   Pulmonary neg pulmonary ROS, neg sleep apnea, neg COPD, Patient abstained from smoking.Not current smoker   Pulmonary exam normal breath sounds clear to auscultation       Cardiovascular Exercise Tolerance: Good METShypertension, Pt. on medications + CAD and + CABG  (-) Past MI (-) dysrhythmias  Rhythm:Regular Rate:Normal - Systolic murmurs    Neuro/Psych negative neurological ROS  negative psych ROS   GI/Hepatic ,neg GERD  ,,(+)     (-) substance abuse    Endo/Other  diabetes  On GLP1 weekly injection. Last taken Trulicity over 7 days ago. Denies Gi symptoms today  Renal/GU negative Renal ROS     Musculoskeletal   Abdominal  (+) + obese  Peds  Hematology   Anesthesia Other Findings Past Medical History: No date: Diabetes mellitus without complication (HCC) No date: Family history of cancer No date: Hypertension No date: Personal history of colonic polyps  Reproductive/Obstetrics                             Anesthesia Physical Anesthesia Plan  ASA: 3  Anesthesia Plan: General   Post-op Pain Management: Minimal or no pain anticipated   Induction: Intravenous  PONV Risk Score and Plan: 2 and Propofol infusion, TIVA and Ondansetron  Airway Management Planned: Nasal Cannula  Additional Equipment: None  Intra-op Plan:   Post-operative Plan:   Informed Consent: I have reviewed the patients History and Physical, chart, labs and discussed the procedure including the risks, benefits and alternatives  for the proposed anesthesia with the patient or authorized representative who has indicated his/her understanding and acceptance.     Dental advisory given  Plan Discussed with: CRNA and Surgeon  Anesthesia Plan Comments: (Discussed risks of anesthesia with patient, including possibility of difficulty with spontaneous ventilation under anesthesia necessitating airway intervention, PONV, and rare risks such as cardiac or respiratory or neurological events, and allergic reactions. Discussed the role of CRNA in patient's perioperative care. Patient understands.)       Anesthesia Quick Evaluation

## 2023-02-10 NOTE — H&P (Signed)
Wyline Mood, MD [address], [address], [place], [place], [address] [address], [address], [place], [place], [address] Phone: [phone]  Fax: [phone]  Primary Care Physician:  Bosie Clos, MD   Pre-Procedure History & Physical: HPI:  Reginald Murphy is a 67 y.o. male is here for an colonoscopy.   Past Medical History:  Diagnosis Date   Diabetes mellitus without complication (HCC)    Family history of cancer    Hypertension    Personal history of colonic polyps     Past Surgical History:  Procedure Laterality Date   CARDIAC CATHETERIZATION     COLONOSCOPY WITH PROPOFOL N/A 09/20/2020   Procedure: COLONOSCOPY WITH PROPOFOL;  Surgeon: Wyline Mood, MD;  Location: [facility] ENDOSCOPY;  Service: Gastroenterology;  Laterality: N/A;   LEFT HEART CATH AND CORONARY ANGIOGRAPHY Left 03/27/2020   Procedure: LEFT HEART CATH AND CORONARY ANGIOGRAPHY;  Surgeon: Dalia Heading, MD;  Location: [facility] INVASIVE CV LAB;  Service: Cardiovascular;  Laterality: Left;    Prior to Admission medications   Medication Sig Start Date End Date Taking? Authorizing Provider  ACCU-CHEK GUIDE test strip Please specify directions, refills and quantity 03/09/18   Bosie Clos, MD  amLODipine (NORVASC) 5 MG tablet TAKE 1 TABLET BY MOUTH EVERY DAY Patient not taking: Reported on 05/15/2022 03/12/21   Bosie Clos, MD  aspirin EC 81 MG tablet Take 81 mg by mouth daily.     [provider]  atorvastatin (LIPITOR) 40 MG tablet Take 40 mg by mouth at bedtime. 04/13/20   [provider]  Blood Glucose Monitoring Suppl (ACCU-CHEK NANO SMARTVIEW) w/Device KIT 1 strip by Does not apply route daily. 03/09/18   Bosie Clos, MD  chlorpheniramine-HYDROcodone (TUSSIONEX PENNKINETIC ER) 10-8 MG/5ML SUER Take 5 mLs by mouth every 12 (twelve) hours as needed for cough. 11/08/20   Bosie Clos, MD  Dulaglutide (TRULICITY) 0.75 MG/0.5ML SOPN INJECT 0.5 MILLILITERS SUB-Q ONCE A WEEK 10/03/21    Bosie Clos, MD  Dulaglutide (TRULICITY) 0.75 MG/0.5ML SOPN Inject 0.5 mLs (0.75 mg total) under the skin once a week for 4 doses Continue to take on thursdays. 12/23/22     Dulaglutide (TRULICITY) 0.75 MG/0.5ML SOPN Inject 0.5 mLs (0.75 mg total) subcutaneously once a week for 4 doses Continue to take on thursdays 01/22/23     glucose blood test strip 1 each by Other route as needed for other. Use as instructed    [provider]  lisinopril (ZESTRIL) 40 MG tablet TAKE 1 TABLET BY MOUTH EVERY DAY Patient not taking: Reported on 05/15/2022 12/20/21   Bosie Clos, MD  metFORMIN (GLUCOPHAGE) 1000 MG tablet TAKE 1 TABLET BY MOUTH TWICE A DAY WITH MEALS 03/19/22   Bosie Clos, MD  metoprolol tartrate (LOPRESSOR) 25 MG tablet Take by mouth. 04/12/20 04/12/21  [provider]  nitroGLYCERIN (NITROSTAT) 0.4 MG SL tablet SMARTSIG:1 Tablet(s) Sublingual PRN 04/18/20   [provider]  [facility] DELICA LANCETS 33G MISC 2 (two) times daily. for testing 05/18/15   [provider]  SYMBICORT 80-4.5 MCG/ACT inhaler TAKE 2 PUFFS BY MOUTH TWICE A DAY Patient not taking: Reported on 03/18/2021 12/20/18   Bosie Clos, MD  tadalafil (CIALIS) 20 MG tablet TAKE ONE TABLET BY MOUTH EVERY THREE DAYS AS NEEDED FOR ERECTILE DYSFUNCTION 11/21/20   Bosie Clos, MD    Allergies as of 01/20/2023   (No Known Allergies)    Family History  Problem Relation Age of  Onset   Diabetes Mother    Seizures Father    Heart attack Maternal Grandmother    Cancer Maternal Grandfather        unk type    Social History   Socioeconomic History   Marital status: Married    Spouse name: Olegario Messier    Number of children: 2   Years of education: Not on file   Highest education level: Not on file  Occupational History   Not on file  Tobacco Use   Smoking status: Never   Smokeless tobacco: Never  Vaping Use   Vaping Use: Never used  Substance and Sexual Activity   Alcohol use:  Yes    Alcohol/week: 0.0 standard drinks of alcohol    Comment: occasionally   Drug use: No   Sexual activity: Not on file  Other Topics Concern   Not on file  Social History Narrative   Not on file   Social Determinants of Health   Financial Resource Strain: Not on file  Food Insecurity: No Food Insecurity (05/02/2022)   Hunger Vital Sign    Worried About Running Out of Food in the Last Year: Never true    Ran Out of Food in the Last Year: Never true  Transportation Needs: No Transportation Needs (05/02/2022)   PRAPARE - Administrator, Civil Service (Medical): No    Lack of Transportation (Non-Medical): No  Physical Activity: Not on file  Stress: Not on file  Social Connections: Not on file  Intimate Partner Violence: Not on file    Review of Systems: See HPI, otherwise negative ROS  Physical Exam: There were no vitals taken for this visit. General:   Alert,  pleasant and cooperative in NAD Head:  Normocephalic and atraumatic. Neck:  Supple; no masses or thyromegaly. Lungs:  Clear throughout to auscultation, normal respiratory effort.    Heart:  +S1, +S2, Regular rate and rhythm, No edema. Abdomen:  Soft, nontender and nondistended. Normal bowel sounds, without guarding, and without rebound.   Neurologic:  Alert and  oriented x4;  grossly normal neurologically.  Impression/Plan: Bing Neighbors Arp is here for an colonoscopy to be performed for surveillance due to prior history of colon polyps   Risks, benefits, limitations, and alternatives regarding  colonoscopy have been reviewed with the patient.  Questions have been answered.  All parties agreeable.   Wyline Mood, MD  02/10/2023, 8:43 AM

## 2023-02-11 ENCOUNTER — Encounter: Payer: Self-pay | Admitting: Gastroenterology

## 2023-02-27 ENCOUNTER — Other Ambulatory Visit: Payer: Self-pay

## 2023-04-29 ENCOUNTER — Other Ambulatory Visit: Payer: Self-pay

## 2023-05-05 ENCOUNTER — Other Ambulatory Visit: Payer: Self-pay

## 2023-05-06 ENCOUNTER — Other Ambulatory Visit: Payer: Self-pay

## 2023-05-06 MED ORDER — TELMISARTAN 20 MG PO TABS
20.0000 mg | ORAL_TABLET | Freq: Every day | ORAL | 3 refills | Status: AC
  Filled 2023-05-06: qty 30, 30d supply, fill #0

## 2023-05-06 MED ORDER — METOPROLOL TARTRATE 25 MG PO TABS
25.0000 mg | ORAL_TABLET | Freq: Two times a day (BID) | ORAL | 3 refills | Status: AC
  Filled 2023-05-06: qty 60, 30d supply, fill #0

## 2023-05-06 MED ORDER — TRULICITY 0.75 MG/0.5ML ~~LOC~~ SOAJ
0.7500 mg | SUBCUTANEOUS | 3 refills | Status: DC
  Filled 2023-05-06: qty 2, 28d supply, fill #0
  Filled 2023-06-08: qty 2, 28d supply, fill #1

## 2023-05-06 MED ORDER — ATORVASTATIN CALCIUM 40 MG PO TABS
40.0000 mg | ORAL_TABLET | Freq: Every day | ORAL | 3 refills | Status: AC
  Filled 2023-05-06: qty 30, 30d supply, fill #0

## 2023-05-06 MED ORDER — METFORMIN HCL 500 MG PO TABS
1000.0000 mg | ORAL_TABLET | Freq: Two times a day (BID) | ORAL | 3 refills | Status: AC
  Filled 2023-05-06: qty 120, 30d supply, fill #0

## 2023-06-08 ENCOUNTER — Other Ambulatory Visit: Payer: Self-pay

## 2023-06-11 DIAGNOSIS — E119 Type 2 diabetes mellitus without complications: Secondary | ICD-10-CM | POA: Diagnosis not present

## 2023-06-11 DIAGNOSIS — I25708 Atherosclerosis of coronary artery bypass graft(s), unspecified, with other forms of angina pectoris: Secondary | ICD-10-CM | POA: Diagnosis not present

## 2023-06-11 DIAGNOSIS — I10 Essential (primary) hypertension: Secondary | ICD-10-CM | POA: Diagnosis not present

## 2023-06-11 DIAGNOSIS — E782 Mixed hyperlipidemia: Secondary | ICD-10-CM | POA: Diagnosis not present

## 2023-06-11 DIAGNOSIS — Z0000 Encounter for general adult medical examination without abnormal findings: Secondary | ICD-10-CM | POA: Diagnosis not present

## 2023-06-11 DIAGNOSIS — Z125 Encounter for screening for malignant neoplasm of prostate: Secondary | ICD-10-CM | POA: Diagnosis not present

## 2023-06-17 DIAGNOSIS — I2583 Coronary atherosclerosis due to lipid rich plaque: Secondary | ICD-10-CM | POA: Diagnosis not present

## 2023-06-17 DIAGNOSIS — I25708 Atherosclerosis of coronary artery bypass graft(s), unspecified, with other forms of angina pectoris: Secondary | ICD-10-CM | POA: Diagnosis not present

## 2023-06-17 DIAGNOSIS — Z951 Presence of aortocoronary bypass graft: Secondary | ICD-10-CM | POA: Diagnosis not present

## 2023-06-17 DIAGNOSIS — I10 Essential (primary) hypertension: Secondary | ICD-10-CM | POA: Diagnosis not present

## 2023-06-17 DIAGNOSIS — E1169 Type 2 diabetes mellitus with other specified complication: Secondary | ICD-10-CM | POA: Diagnosis not present

## 2023-06-17 DIAGNOSIS — I2510 Atherosclerotic heart disease of native coronary artery without angina pectoris: Secondary | ICD-10-CM | POA: Diagnosis not present

## 2023-06-17 DIAGNOSIS — E782 Mixed hyperlipidemia: Secondary | ICD-10-CM | POA: Diagnosis not present

## 2023-06-17 DIAGNOSIS — Z794 Long term (current) use of insulin: Secondary | ICD-10-CM | POA: Diagnosis not present

## 2023-06-17 DIAGNOSIS — Z23 Encounter for immunization: Secondary | ICD-10-CM | POA: Diagnosis not present

## 2023-06-27 DIAGNOSIS — E119 Type 2 diabetes mellitus without complications: Secondary | ICD-10-CM | POA: Diagnosis not present

## 2023-06-27 DIAGNOSIS — Z7982 Long term (current) use of aspirin: Secondary | ICD-10-CM | POA: Diagnosis not present

## 2023-06-27 DIAGNOSIS — Z7984 Long term (current) use of oral hypoglycemic drugs: Secondary | ICD-10-CM | POA: Diagnosis not present

## 2023-06-27 DIAGNOSIS — Z6833 Body mass index (BMI) 33.0-33.9, adult: Secondary | ICD-10-CM | POA: Diagnosis not present

## 2023-06-27 DIAGNOSIS — Z8249 Family history of ischemic heart disease and other diseases of the circulatory system: Secondary | ICD-10-CM | POA: Diagnosis not present

## 2023-06-27 DIAGNOSIS — E785 Hyperlipidemia, unspecified: Secondary | ICD-10-CM | POA: Diagnosis not present

## 2023-06-27 DIAGNOSIS — Z809 Family history of malignant neoplasm, unspecified: Secondary | ICD-10-CM | POA: Diagnosis not present

## 2023-06-27 DIAGNOSIS — I2510 Atherosclerotic heart disease of native coronary artery without angina pectoris: Secondary | ICD-10-CM | POA: Diagnosis not present

## 2023-06-27 DIAGNOSIS — Z008 Encounter for other general examination: Secondary | ICD-10-CM | POA: Diagnosis not present

## 2023-06-27 DIAGNOSIS — Z7985 Long-term (current) use of injectable non-insulin antidiabetic drugs: Secondary | ICD-10-CM | POA: Diagnosis not present

## 2023-06-27 DIAGNOSIS — Z833 Family history of diabetes mellitus: Secondary | ICD-10-CM | POA: Diagnosis not present

## 2023-06-27 DIAGNOSIS — E669 Obesity, unspecified: Secondary | ICD-10-CM | POA: Diagnosis not present

## 2023-06-27 DIAGNOSIS — I10 Essential (primary) hypertension: Secondary | ICD-10-CM | POA: Diagnosis not present

## 2023-07-31 ENCOUNTER — Other Ambulatory Visit: Payer: Self-pay

## 2023-07-31 MED ORDER — TRULICITY 1.5 MG/0.5ML ~~LOC~~ SOAJ
1.5000 mg | SUBCUTANEOUS | 5 refills | Status: DC
  Filled 2023-07-31: qty 2, 28d supply, fill #0
  Filled 2023-09-08: qty 2, 28d supply, fill #1
  Filled 2023-10-07: qty 2, 28d supply, fill #2
  Filled 2023-11-03: qty 2, 28d supply, fill #3
  Filled 2023-12-02: qty 2, 28d supply, fill #4
  Filled 2023-12-30: qty 2, 28d supply, fill #5

## 2023-09-08 ENCOUNTER — Other Ambulatory Visit: Payer: Self-pay

## 2023-10-07 ENCOUNTER — Other Ambulatory Visit: Payer: Self-pay

## 2023-10-08 ENCOUNTER — Other Ambulatory Visit: Payer: Self-pay

## 2023-11-03 ENCOUNTER — Other Ambulatory Visit: Payer: Self-pay

## 2023-11-04 ENCOUNTER — Other Ambulatory Visit: Payer: Self-pay

## 2023-12-02 ENCOUNTER — Other Ambulatory Visit: Payer: Self-pay

## 2023-12-29 DIAGNOSIS — I25708 Atherosclerosis of coronary artery bypass graft(s), unspecified, with other forms of angina pectoris: Secondary | ICD-10-CM | POA: Diagnosis not present

## 2023-12-29 DIAGNOSIS — I2583 Coronary atherosclerosis due to lipid rich plaque: Secondary | ICD-10-CM | POA: Diagnosis not present

## 2023-12-29 DIAGNOSIS — Z951 Presence of aortocoronary bypass graft: Secondary | ICD-10-CM | POA: Diagnosis not present

## 2023-12-29 DIAGNOSIS — I2510 Atherosclerotic heart disease of native coronary artery without angina pectoris: Secondary | ICD-10-CM | POA: Diagnosis not present

## 2023-12-29 DIAGNOSIS — I10 Essential (primary) hypertension: Secondary | ICD-10-CM | POA: Diagnosis not present

## 2023-12-29 DIAGNOSIS — E782 Mixed hyperlipidemia: Secondary | ICD-10-CM | POA: Diagnosis not present

## 2023-12-30 ENCOUNTER — Other Ambulatory Visit: Payer: Self-pay

## 2024-01-06 ENCOUNTER — Other Ambulatory Visit: Payer: Self-pay

## 2024-02-11 ENCOUNTER — Other Ambulatory Visit: Payer: Self-pay

## 2024-02-11 MED ORDER — TRULICITY 1.5 MG/0.5ML ~~LOC~~ SOAJ
1.5000 mg | SUBCUTANEOUS | 5 refills | Status: DC
  Filled 2024-02-11: qty 2, 28d supply, fill #0

## 2024-02-12 ENCOUNTER — Other Ambulatory Visit: Payer: Self-pay

## 2024-02-12 MED ORDER — TRULICITY 3 MG/0.5ML ~~LOC~~ SOAJ
3.0000 mg | SUBCUTANEOUS | 5 refills | Status: DC
  Filled 2024-02-12 – 2024-02-15 (×2): qty 2, 28d supply, fill #0
  Filled 2024-03-07: qty 2, 28d supply, fill #1
  Filled 2024-04-18: qty 2, 28d supply, fill #2
  Filled 2024-05-19: qty 2, 28d supply, fill #3
  Filled 2024-06-15: qty 2, 28d supply, fill #4
  Filled 2024-07-13: qty 2, 28d supply, fill #5

## 2024-02-15 ENCOUNTER — Other Ambulatory Visit: Payer: Self-pay

## 2024-02-22 DIAGNOSIS — Z833 Family history of diabetes mellitus: Secondary | ICD-10-CM | POA: Diagnosis not present

## 2024-02-22 DIAGNOSIS — E785 Hyperlipidemia, unspecified: Secondary | ICD-10-CM | POA: Diagnosis not present

## 2024-02-22 DIAGNOSIS — I25119 Atherosclerotic heart disease of native coronary artery with unspecified angina pectoris: Secondary | ICD-10-CM | POA: Diagnosis not present

## 2024-02-22 DIAGNOSIS — Z7982 Long term (current) use of aspirin: Secondary | ICD-10-CM | POA: Diagnosis not present

## 2024-02-22 DIAGNOSIS — Z7984 Long term (current) use of oral hypoglycemic drugs: Secondary | ICD-10-CM | POA: Diagnosis not present

## 2024-02-22 DIAGNOSIS — Z7985 Long-term (current) use of injectable non-insulin antidiabetic drugs: Secondary | ICD-10-CM | POA: Diagnosis not present

## 2024-02-22 DIAGNOSIS — Z008 Encounter for other general examination: Secondary | ICD-10-CM | POA: Diagnosis not present

## 2024-02-22 DIAGNOSIS — Z951 Presence of aortocoronary bypass graft: Secondary | ICD-10-CM | POA: Diagnosis not present

## 2024-02-22 DIAGNOSIS — Z8249 Family history of ischemic heart disease and other diseases of the circulatory system: Secondary | ICD-10-CM | POA: Diagnosis not present

## 2024-02-22 DIAGNOSIS — Z6835 Body mass index (BMI) 35.0-35.9, adult: Secondary | ICD-10-CM | POA: Diagnosis not present

## 2024-02-22 DIAGNOSIS — Z809 Family history of malignant neoplasm, unspecified: Secondary | ICD-10-CM | POA: Diagnosis not present

## 2024-02-22 DIAGNOSIS — E1159 Type 2 diabetes mellitus with other circulatory complications: Secondary | ICD-10-CM | POA: Diagnosis not present

## 2024-02-26 ENCOUNTER — Telehealth: Payer: Self-pay

## 2024-02-26 NOTE — Telephone Encounter (Signed)
 Copied from CRM [id number]. Topic: General - Other >> Feb 25, 2024  2:57 PM Oddis Bench wrote: Reason for CRM: Ethelle Herb calling from [employer] verify the fax number an informing faxing results to office

## 2024-03-07 ENCOUNTER — Other Ambulatory Visit: Payer: Self-pay

## 2024-03-21 DIAGNOSIS — R051 Acute cough: Secondary | ICD-10-CM | POA: Diagnosis not present

## 2024-04-18 ENCOUNTER — Other Ambulatory Visit: Payer: Self-pay

## 2024-04-19 ENCOUNTER — Other Ambulatory Visit: Payer: Self-pay

## 2024-08-17 ENCOUNTER — Other Ambulatory Visit: Payer: Self-pay

## 2024-08-17 MED ORDER — TRULICITY 3 MG/0.5ML ~~LOC~~ SOAJ
3.0000 mg | SUBCUTANEOUS | 5 refills | Status: AC
  Filled 2024-08-17: qty 2, 28d supply, fill #0
  Filled 2024-09-15: qty 2, 28d supply, fill #1
  Filled 2024-10-10: qty 2, 28d supply, fill #2

## 2024-09-15 ENCOUNTER — Other Ambulatory Visit: Payer: Self-pay

## 2024-09-16 ENCOUNTER — Other Ambulatory Visit: Payer: Self-pay

## 2024-10-10 ENCOUNTER — Other Ambulatory Visit: Payer: Self-pay

## 2024-10-11 ENCOUNTER — Other Ambulatory Visit: Payer: Self-pay
